# Patient Record
Sex: Female | Born: 1935 | Race: White | Hispanic: No | Smoking: Never smoker
Health system: Southern US, Community
[De-identification: ages and names within clinical notes are randomized; demographics above are authoritative.]

## PROBLEM LIST (undated history)

## (undated) ENCOUNTER — Inpatient Hospital Stay: Admission: EM | Payer: Self-pay | Source: Home / Self Care

## (undated) DIAGNOSIS — M810 Age-related osteoporosis without current pathological fracture: Secondary | ICD-10-CM

## (undated) DIAGNOSIS — H409 Unspecified glaucoma: Secondary | ICD-10-CM

## (undated) DIAGNOSIS — K589 Irritable bowel syndrome without diarrhea: Secondary | ICD-10-CM

## (undated) DIAGNOSIS — E7800 Pure hypercholesterolemia, unspecified: Secondary | ICD-10-CM

## (undated) DIAGNOSIS — I10 Essential (primary) hypertension: Secondary | ICD-10-CM

## (undated) DIAGNOSIS — R002 Palpitations: Secondary | ICD-10-CM

## (undated) HISTORY — PX: EYE SURGERY: SHX253

## (undated) HISTORY — PX: ABDOMINAL HYSTERECTOMY: SHX81

## (undated) HISTORY — PX: BREAST SURGERY: SHX581

## (undated) HISTORY — PX: CHOLECYSTECTOMY: SHX55

---

## 2004-02-17 ENCOUNTER — Ambulatory Visit (HOSPITAL_COMMUNITY): Admission: RE | Admit: 2004-02-17 | Discharge: 2004-02-17 | Payer: Self-pay | Admitting: Internal Medicine

## 2013-03-23 ENCOUNTER — Encounter (INDEPENDENT_AMBULATORY_CARE_PROVIDER_SITE_OTHER): Payer: Self-pay | Admitting: *Deleted

## 2014-04-05 ENCOUNTER — Other Ambulatory Visit (HOSPITAL_COMMUNITY): Payer: Self-pay | Admitting: Internal Medicine

## 2014-04-05 DIAGNOSIS — R1013 Epigastric pain: Secondary | ICD-10-CM

## 2014-04-07 ENCOUNTER — Ambulatory Visit (HOSPITAL_COMMUNITY): Payer: Medicare Other

## 2014-04-26 ENCOUNTER — Ambulatory Visit (HOSPITAL_COMMUNITY): Payer: Medicare Other

## 2014-04-28 ENCOUNTER — Encounter (INDEPENDENT_AMBULATORY_CARE_PROVIDER_SITE_OTHER): Payer: Self-pay | Admitting: *Deleted

## 2014-05-16 ENCOUNTER — Ambulatory Visit (HOSPITAL_COMMUNITY)
Admission: RE | Admit: 2014-05-16 | Discharge: 2014-05-16 | Disposition: A | Discharge status: Home / Self Care | Payer: Medicare Other | Source: Ambulatory Visit | Attending: Internal Medicine | Admitting: Internal Medicine

## 2014-05-16 DIAGNOSIS — K5730 Diverticulosis of large intestine without perforation or abscess without bleeding: Secondary | ICD-10-CM | POA: Diagnosis not present

## 2014-05-16 DIAGNOSIS — K838 Other specified diseases of biliary tract: Secondary | ICD-10-CM | POA: Diagnosis not present

## 2014-05-16 DIAGNOSIS — R109 Unspecified abdominal pain: Secondary | ICD-10-CM | POA: Diagnosis not present

## 2014-05-16 DIAGNOSIS — R160 Hepatomegaly, not elsewhere classified: Secondary | ICD-10-CM | POA: Diagnosis not present

## 2014-05-16 DIAGNOSIS — K7689 Other specified diseases of liver: Secondary | ICD-10-CM | POA: Insufficient documentation

## 2014-05-16 DIAGNOSIS — Z9889 Other specified postprocedural states: Secondary | ICD-10-CM | POA: Diagnosis not present

## 2014-05-16 DIAGNOSIS — K439 Ventral hernia without obstruction or gangrene: Secondary | ICD-10-CM | POA: Insufficient documentation

## 2014-05-16 DIAGNOSIS — R1013 Epigastric pain: Secondary | ICD-10-CM

## 2014-05-16 MED ORDER — IOHEXOL 300 MG/ML  SOLN
100.0000 mL | Freq: Once | INTRAMUSCULAR | Status: AC | PRN
  Administered 2014-05-16: 100 mL via INTRAVENOUS

## 2014-05-17 LAB — POCT I-STAT CREATININE: Creatinine, Ser: 1 mg/dL (ref 0.50–1.10)

## 2014-07-18 ENCOUNTER — Ambulatory Visit (INDEPENDENT_AMBULATORY_CARE_PROVIDER_SITE_OTHER): Payer: Medicare Other | Admitting: Internal Medicine

## 2014-07-20 ENCOUNTER — Encounter (INDEPENDENT_AMBULATORY_CARE_PROVIDER_SITE_OTHER): Payer: Self-pay | Admitting: *Deleted

## 2014-07-20 ENCOUNTER — Telehealth (INDEPENDENT_AMBULATORY_CARE_PROVIDER_SITE_OTHER): Payer: Self-pay | Admitting: *Deleted

## 2014-07-20 NOTE — Telephone Encounter (Signed)
Elwyn NO SHOWED for her apt with Dr. Karilyn Cotaehman on 07/18/14. A NS letter has been mailed.

## 2014-07-20 NOTE — Telephone Encounter (Signed)
Noted  

## 2015-08-24 ENCOUNTER — Encounter (INDEPENDENT_AMBULATORY_CARE_PROVIDER_SITE_OTHER): Payer: Self-pay | Admitting: *Deleted

## 2015-10-02 DIAGNOSIS — H538 Other visual disturbances: Secondary | ICD-10-CM | POA: Diagnosis not present

## 2015-10-02 DIAGNOSIS — Z970 Presence of artificial eye: Secondary | ICD-10-CM | POA: Diagnosis not present

## 2015-10-02 DIAGNOSIS — J449 Chronic obstructive pulmonary disease, unspecified: Secondary | ICD-10-CM | POA: Diagnosis not present

## 2015-10-02 DIAGNOSIS — E119 Type 2 diabetes mellitus without complications: Secondary | ICD-10-CM | POA: Diagnosis not present

## 2015-10-02 DIAGNOSIS — Z79899 Other long term (current) drug therapy: Secondary | ICD-10-CM | POA: Diagnosis not present

## 2015-10-02 DIAGNOSIS — Z7951 Long term (current) use of inhaled steroids: Secondary | ICD-10-CM | POA: Diagnosis not present

## 2015-10-02 DIAGNOSIS — Z880 Allergy status to penicillin: Secondary | ICD-10-CM | POA: Diagnosis not present

## 2015-10-02 DIAGNOSIS — H269 Unspecified cataract: Secondary | ICD-10-CM | POA: Diagnosis not present

## 2015-10-02 DIAGNOSIS — H2511 Age-related nuclear cataract, right eye: Secondary | ICD-10-CM | POA: Diagnosis not present

## 2015-10-02 DIAGNOSIS — H4010X0 Unspecified open-angle glaucoma, stage unspecified: Secondary | ICD-10-CM | POA: Diagnosis not present

## 2015-10-02 DIAGNOSIS — F329 Major depressive disorder, single episode, unspecified: Secondary | ICD-10-CM | POA: Diagnosis not present

## 2015-10-02 DIAGNOSIS — I10 Essential (primary) hypertension: Secondary | ICD-10-CM | POA: Diagnosis not present

## 2015-10-02 DIAGNOSIS — K219 Gastro-esophageal reflux disease without esophagitis: Secondary | ICD-10-CM | POA: Diagnosis not present

## 2015-10-02 DIAGNOSIS — M810 Age-related osteoporosis without current pathological fracture: Secondary | ICD-10-CM | POA: Diagnosis not present

## 2015-10-02 DIAGNOSIS — K589 Irritable bowel syndrome without diarrhea: Secondary | ICD-10-CM | POA: Diagnosis not present

## 2015-10-02 DIAGNOSIS — M1990 Unspecified osteoarthritis, unspecified site: Secondary | ICD-10-CM | POA: Diagnosis not present

## 2015-10-02 DIAGNOSIS — F419 Anxiety disorder, unspecified: Secondary | ICD-10-CM | POA: Diagnosis not present

## 2015-10-02 DIAGNOSIS — H1850 Unspecified hereditary corneal dystrophies: Secondary | ICD-10-CM | POA: Diagnosis not present

## 2015-10-10 ENCOUNTER — Ambulatory Visit (INDEPENDENT_AMBULATORY_CARE_PROVIDER_SITE_OTHER): Payer: Medicare Other | Admitting: Internal Medicine

## 2015-10-10 ENCOUNTER — Encounter (INDEPENDENT_AMBULATORY_CARE_PROVIDER_SITE_OTHER): Payer: Self-pay | Admitting: Internal Medicine

## 2015-10-19 DIAGNOSIS — G309 Alzheimer's disease, unspecified: Secondary | ICD-10-CM | POA: Diagnosis not present

## 2015-10-19 DIAGNOSIS — Z87891 Personal history of nicotine dependence: Secondary | ICD-10-CM | POA: Diagnosis not present

## 2015-10-19 DIAGNOSIS — K589 Irritable bowel syndrome without diarrhea: Secondary | ICD-10-CM | POA: Diagnosis not present

## 2015-10-19 DIAGNOSIS — Z6829 Body mass index (BMI) 29.0-29.9, adult: Secondary | ICD-10-CM | POA: Diagnosis not present

## 2015-10-19 DIAGNOSIS — K219 Gastro-esophageal reflux disease without esophagitis: Secondary | ICD-10-CM | POA: Diagnosis not present

## 2015-11-04 ENCOUNTER — Emergency Department (HOSPITAL_COMMUNITY)
Admission: EM | Admit: 2015-11-04 | Discharge: 2015-11-04 | Disposition: A | Discharge status: Home / Self Care | Payer: Medicare Other | Attending: Emergency Medicine | Admitting: Emergency Medicine

## 2015-11-04 ENCOUNTER — Emergency Department (HOSPITAL_COMMUNITY): Payer: Medicare Other

## 2015-11-04 ENCOUNTER — Encounter (HOSPITAL_COMMUNITY): Payer: Self-pay

## 2015-11-04 DIAGNOSIS — Z880 Allergy status to penicillin: Secondary | ICD-10-CM | POA: Insufficient documentation

## 2015-11-04 DIAGNOSIS — Y998 Other external cause status: Secondary | ICD-10-CM | POA: Insufficient documentation

## 2015-11-04 DIAGNOSIS — I10 Essential (primary) hypertension: Secondary | ICD-10-CM | POA: Diagnosis not present

## 2015-11-04 DIAGNOSIS — W19XXXA Unspecified fall, initial encounter: Secondary | ICD-10-CM

## 2015-11-04 DIAGNOSIS — W01198A Fall on same level from slipping, tripping and stumbling with subsequent striking against other object, initial encounter: Secondary | ICD-10-CM | POA: Diagnosis not present

## 2015-11-04 DIAGNOSIS — Y9389 Activity, other specified: Secondary | ICD-10-CM | POA: Insufficient documentation

## 2015-11-04 DIAGNOSIS — Y9289 Other specified places as the place of occurrence of the external cause: Secondary | ICD-10-CM | POA: Diagnosis not present

## 2015-11-04 DIAGNOSIS — Z8739 Personal history of other diseases of the musculoskeletal system and connective tissue: Secondary | ICD-10-CM | POA: Insufficient documentation

## 2015-11-04 DIAGNOSIS — Z8639 Personal history of other endocrine, nutritional and metabolic disease: Secondary | ICD-10-CM | POA: Insufficient documentation

## 2015-11-04 DIAGNOSIS — Z8719 Personal history of other diseases of the digestive system: Secondary | ICD-10-CM | POA: Diagnosis not present

## 2015-11-04 DIAGNOSIS — S0990XA Unspecified injury of head, initial encounter: Secondary | ICD-10-CM | POA: Diagnosis not present

## 2015-11-04 DIAGNOSIS — Z8669 Personal history of other diseases of the nervous system and sense organs: Secondary | ICD-10-CM | POA: Insufficient documentation

## 2015-11-04 DIAGNOSIS — S0181XA Laceration without foreign body of other part of head, initial encounter: Secondary | ICD-10-CM | POA: Diagnosis not present

## 2015-11-04 HISTORY — DX: Pure hypercholesterolemia, unspecified: E78.00

## 2015-11-04 HISTORY — DX: Unspecified glaucoma: H40.9

## 2015-11-04 HISTORY — DX: Irritable bowel syndrome, unspecified: K58.9

## 2015-11-04 HISTORY — DX: Age-related osteoporosis without current pathological fracture: M81.0

## 2015-11-04 HISTORY — DX: Essential (primary) hypertension: I10

## 2015-11-04 HISTORY — DX: Palpitations: R00.2

## 2015-11-04 MED ORDER — LIDOCAINE-EPINEPHRINE (PF) 1 %-1:200000 IJ SOLN
5.0000 mL | Freq: Once | INTRAMUSCULAR | Status: AC
  Administered 2015-11-04: 5 mL via INTRADERMAL
  Filled 2015-11-04: qty 10

## 2015-11-04 MED ORDER — TETANUS-DIPHTH-ACELL PERTUSSIS 5-2.5-18.5 LF-MCG/0.5 IM SUSP
0.5000 mL | Freq: Once | INTRAMUSCULAR | Status: AC
  Administered 2015-11-04: 0.5 mL via INTRAMUSCULAR
  Filled 2015-11-04: qty 0.5

## 2015-11-04 NOTE — ED Notes (Signed)
I was walking in the yard without my walker, and my legs gave out and I fell. Laceration noted to left forehead. Hit her head on a lawn chair. Did not have a loss of consciousness.

## 2015-11-04 NOTE — ED Provider Notes (Signed)
CSN: [id number]     Arrival date & time 11/04/15  1907 History   First MD Initiated Contact with Patient 11/04/15 1939     Chief Complaint  Patient presents with  . Head Laceration     (Consider location/radiation/quality/duration/timing/severity/associated sxs/prior Treatment) HPI   82 year old female presenting after fall. Patient is handling with walker when she lost her balance and fell striking her head. No loss of consciousness. She has a small laceration to her left forehead. She has no other complaints aside from this. No blood thinners. No nausea or vomiting. No acute visual complaints. Family reports that she is at her baseline mental status.  Past Medical History  Diagnosis Date  . Osteoporosis   . Hypertension   . High cholesterol   . Irritable bowel syndrome   . Palpitations   . Glaucoma    Past Surgical History  Procedure Laterality Date  . Eye surgery    . Breast surgery    . Abdominal hysterectomy     No family history on file. Social History  Substance Use Topics  . Smoking status: Never Smoker   . Smokeless tobacco: None  . Alcohol Use: No   OB History    No data available     Review of Systems  All systems reviewed and negative, other than as noted in HPI.   Allergies  Penicillins  Home Medications   Prior to Admission medications   Not on File   BP 166/70 mmHg  Pulse 60  Temp(Src) 98.4 F (36.9 C) (Oral)  Resp 20  Ht  (1.6 m)  Wt 148 lb (67.132 kg)  BMI 26.22 kg/m2  SpO2 95% Physical Exam  Constitutional: She is oriented to person, place, and time. She appears well-developed and well-nourished. No distress.  HENT:  Head: Normocephalic.  Mouth/Throat: Oropharynx is clear and moist.  Approximately 1.5 cm laceration to left forehead. Minimal bleeding. No evidence of foreign body. Minimal surrounding soft tissue tenderness. No facial tenderness otherwise. No midline cervical tenderness.  Eyes: Conjunctivae and EOM are normal.  Pupils are equal, round, and reactive to light. Right eye exhibits no discharge. Left eye exhibits no discharge.  Neck: Neck supple.  Cardiovascular: Normal rate, regular rhythm and normal heart sounds.  Exam reveals no gallop and no friction rub.   No murmur heard. Pulmonary/Chest: Effort normal and breath sounds normal. No respiratory distress.  Abdominal: Soft. She exhibits no distension. There is no tenderness.  Musculoskeletal: She exhibits no edema or tenderness.  No midline spinal tenderness. No apparent bony tenderness of extremities. No apparent pain with range of motion of the large joints.  Neurological: She is alert and oriented to person, place, and time. No cranial nerve deficit. She exhibits normal muscle tone. Coordination normal.  Skin: Skin is warm and dry.  Psychiatric: She has a normal mood and affect. Her behavior is normal. Thought content normal.  Nursing note and vitals reviewed.   ED Course  Procedures (including critical care time)  LACERATION REPAIR Performed by: Raeford Razor Authorized by: Raeford Razor Consent: Verbal consent obtained. Risks and benefits: risks, benefits and alternatives were discussed Consent given by: patient Patient identity confirmed: provided demographic data Prepped and Draped in normal sterile fashion Wound explored  Laceration Location: L forehead  Laceration Length: 1.5 cm  No Foreign Bodies seen or palpated  Anesthesia: local infiltration  Local anesthetic: lidocaine 1% w epinephrine  Anesthetic total: 1.5 ml  Irrigation method: syringe Amount of cleaning: standard  Skin closure:  6-0 prolene  Number of sutures: 3  Technique: simple interupted  Patient tolerance: Patient tolerated the procedure well with no immediate complications. Labs Review Labs Reviewed - No data to display  Imaging Review No results found. I have personally reviewed and evaluated these images and lab results as part of my medical  decision-making.   EKG Interpretation None      MDM   Final diagnoses:  Forehead laceration, initial encounter  Fall, initial encounter  Closed head injury, initial encounter    82 year old female with small left forehead laceration after fall. Fall sounds mechanical in nature. Nonfocal neuro exam. Unremarkable imaging. Head injury instructions and continued wound care was discussed. Outpatient follow-up as needed and for suture removal in 5-7 days.    Raeford Razor, MD 11/11/15 1452

## 2015-11-04 NOTE — Discharge Instructions (Signed)
Facial Laceration ° A facial laceration is a cut on the face. These injuries can be painful and cause bleeding. Lacerations usually heal quickly, but they need special care to reduce scarring. °DIAGNOSIS  °Your health care provider will take a medical history, ask for details about how the injury occurred, and examine the wound to determine how deep the cut is. °TREATMENT  °Some facial lacerations may not require closure. Others may not be able to be closed because of an increased risk of infection. The risk of infection and the chance for successful closure will depend on various factors, including the amount of time since the injury occurred. °The wound may be cleaned to help prevent infection. If closure is appropriate, pain medicines may be given if needed. Your health care provider will use stitches (sutures), wound glue (adhesive), or skin adhesive strips to repair the laceration. These tools bring the skin edges together to allow for faster healing and a better cosmetic outcome. If needed, you may also be given a tetanus shot. °HOME CARE INSTRUCTIONS °· Only take over-the-counter or prescription medicines as directed by your health care provider. °· Follow your health care provider's instructions for wound care. These instructions will vary depending on the technique used for closing the wound. °For Sutures: °· Keep the wound clean and dry.   °· If you were given a bandage (dressing), you should change it at least once a day. Also change the dressing if it becomes wet or dirty, or as directed by your health care provider.   °· Wash the wound with soap and water 2 times a day. Rinse the wound off with water to remove all soap. Pat the wound dry with a clean towel.   °· After cleaning, apply a thin layer of the antibiotic ointment recommended by your health care provider. This will help prevent infection and keep the dressing from sticking.   °· You may shower as usual after the first 24 hours. Do not soak the  wound in water until the sutures are removed.   °· Get your sutures removed as directed by your health care provider. With facial lacerations, sutures should usually be taken out after 4-5 days to avoid stitch marks.   °· Wait a few days after your sutures are removed before applying any makeup. °For Skin Adhesive Strips: °· Keep the wound clean and dry.   °· Do not get the skin adhesive strips wet. You may bathe carefully, using caution to keep the wound dry.   °· If the wound gets wet, pat it dry with a clean towel.   °· Skin adhesive strips will fall off on their own. You may trim the strips as the wound heals. Do not remove skin adhesive strips that are still stuck to the wound. They will fall off in time.   °For Wound Adhesive: °· You may briefly wet your wound in the shower or bath. Do not soak or scrub the wound. Do not swim. Avoid periods of heavy sweating until the skin adhesive has fallen off on its own. After showering or bathing, gently pat the wound dry with a clean towel.   °· Do not apply liquid medicine, cream medicine, ointment medicine, or makeup to your wound while the skin adhesive is in place. This may loosen the film before your wound is healed.   °· If a dressing is placed over the wound, be careful not to apply tape directly over the skin adhesive. This may cause the adhesive to be pulled off before the wound is healed.   °· Avoid   prolonged exposure to sunlight or tanning lamps while the skin adhesive is in place. °· The skin adhesive will usually remain in place for 5-10 days, then naturally fall off the skin. Do not pick at the adhesive film.   °After Healing: °Once the wound has healed, cover the wound with sunscreen during the day for 1 full year. This can help minimize scarring. Exposure to ultraviolet light in the first year will darken the scar. It can take 1-2 years for the scar to lose its redness and to heal completely.  °SEEK MEDICAL CARE IF: °· You have a fever. °SEEK IMMEDIATE  MEDICAL CARE IF: °· You have redness, pain, or swelling around the wound.   °· You see a yellowish-white fluid (pus) coming from the wound.   °  °This information is not intended to replace advice given to you by your health care provider. Make sure you discuss any questions you have with your health care provider. °  °Document Released: 10/17/2004 Document Revised: 09/30/2014 Document Reviewed: 04/22/2013 °Elsevier Interactive Patient Education ©2016 [employer] ° °Head Injury, Adult °You have a head injury. Headaches and throwing up (vomiting) are common after a head injury. It should be easy to wake up from sleeping. Sometimes you must stay in the hospital. Most problems happen within the first 24 hours. Side effects may occur up to 7-10 days after the injury.  °WHAT ARE THE TYPES OF HEAD INJURIES? °Head injuries can be as minor as a bump. Some head injuries can be more severe. More severe head injuries include: °· A jarring injury to the brain (concussion). °· A bruise of the brain (contusion). This mean there is bleeding in the brain that can cause swelling. °· A cracked skull (skull fracture). °· Bleeding in the brain that collects, clots, and forms a bump (hematoma). °WHEN SHOULD I GET HELP RIGHT AWAY?  °· You are confused or sleepy. °· You cannot be woken up. °· You feel sick to your stomach (nauseous) or keep throwing up (vomiting). °· Your dizziness or unsteadiness is getting worse. °· You have very bad, lasting headaches that are not helped by medicine. Take medicines only as told by your doctor. °· You cannot use your arms or legs like normal. °· You cannot walk. °· You notice changes in the black spots in the center of the colored part of your eye (pupil). °· You have clear or bloody fluid coming from your nose or ears. °· You have trouble seeing. °During the next 24 hours after the injury, you must stay with someone who can watch you. This person should get help right away (call 911 in the [place] if  you start to shake and are not able to control it (have seizures), you pass out, or you are unable to wake up. °HOW CAN I PREVENT A HEAD INJURY IN THE FUTURE? °· Wear seat belts. °· Wear a helmet while bike riding and playing sports like football. °· Stay away from dangerous activities around the house. °WHEN CAN I RETURN TO NORMAL ACTIVITIES AND ATHLETICS? °See your doctor before doing these activities. You should not do normal activities or play contact sports until 1 week after the following symptoms have stopped: °· Headache that does not go away. °· Dizziness. °· Poor attention. °· Confusion. °· Memory problems. °· Sickness to your stomach or throwing up. °· Tiredness. °· Fussiness. °· Bothered by bright lights or loud noises. °· Anxiousness or depression. °· Restless sleep. °MAKE SURE YOU:  °· Understand these instructions. °· Will   watch your condition. °· Will get help right away if you are not doing well or get worse. °  °This information is not intended to replace advice given to you by your health care provider. Make sure you discuss any questions you have with your health care provider. °  °Document Released: 08/22/2008 Document Revised: 09/30/2014 Document Reviewed: 05/17/2013 °Elsevier Interactive Patient Education ©2016 [employer] ° °

## 2015-11-10 ENCOUNTER — Encounter (HOSPITAL_COMMUNITY): Payer: Self-pay | Admitting: Emergency Medicine

## 2015-11-10 ENCOUNTER — Emergency Department (HOSPITAL_COMMUNITY)
Admission: EM | Admit: 2015-11-10 | Discharge: 2015-11-10 | Disposition: A | Discharge status: Home / Self Care | Payer: Medicare Other | Attending: Emergency Medicine | Admitting: Emergency Medicine

## 2015-11-10 DIAGNOSIS — K589 Irritable bowel syndrome without diarrhea: Secondary | ICD-10-CM | POA: Insufficient documentation

## 2015-11-10 DIAGNOSIS — M810 Age-related osteoporosis without current pathological fracture: Secondary | ICD-10-CM | POA: Diagnosis not present

## 2015-11-10 DIAGNOSIS — H409 Unspecified glaucoma: Secondary | ICD-10-CM | POA: Diagnosis not present

## 2015-11-10 DIAGNOSIS — Z880 Allergy status to penicillin: Secondary | ICD-10-CM | POA: Diagnosis not present

## 2015-11-10 DIAGNOSIS — I10 Essential (primary) hypertension: Secondary | ICD-10-CM | POA: Diagnosis not present

## 2015-11-10 DIAGNOSIS — Z79899 Other long term (current) drug therapy: Secondary | ICD-10-CM | POA: Insufficient documentation

## 2015-11-10 DIAGNOSIS — Z7951 Long term (current) use of inhaled steroids: Secondary | ICD-10-CM | POA: Insufficient documentation

## 2015-11-10 DIAGNOSIS — E7800 Pure hypercholesterolemia, unspecified: Secondary | ICD-10-CM | POA: Diagnosis not present

## 2015-11-10 DIAGNOSIS — Z4802 Encounter for removal of sutures: Secondary | ICD-10-CM | POA: Diagnosis not present

## 2015-11-10 NOTE — ED Provider Notes (Signed)
CSN: [id number]     Arrival date & time 11/10/15  1700 History   First MD Initiated Contact with Patient 11/10/15 1729     Chief Complaint  Patient presents with  . Suture / Staple Removal     (Consider location/radiation/quality/duration/timing/severity/associated sxs/prior Treatment) HPI.... Status post left forehead laceration approximately 6 days ago. Here for suture removal. She reports no problems with wound.  Past Medical History  Diagnosis Date  . Osteoporosis   . Hypertension   . High cholesterol   . Irritable bowel syndrome   . Palpitations   . Glaucoma    Past Surgical History  Procedure Laterality Date  . Eye surgery    . Breast surgery    . Abdominal hysterectomy     History reviewed. No pertinent family history. Social History  Substance Use Topics  . Smoking status: Never Smoker   . Smokeless tobacco: None  . Alcohol Use: No   OB History    No data available     Review of Systems  All other systems reviewed and are negative.     Allergies  Penicillins  Home Medications   Prior to Admission medications   Medication Sig Start Date End Date Taking? Authorizing Provider  alendronate (FOSAMAX) 70 MG tablet Take 70 mg by mouth once a week. Take with a full glass of water on an empty stomach.Takes on Monday    Historical Provider, MD  atorvastatin (LIPITOR) 20 MG tablet Take 20 mg by mouth daily.    Historical Provider, MD  calcium-vitamin D (OSCAL WITH D) 500-200 MG-UNIT tablet Take 1 tablet by mouth daily with breakfast.    Historical Provider, MD  clonazePAM (KLONOPIN) 1 MG tablet Take 1 mg by mouth 3 (three) times daily.     Historical Provider, MD  dexlansoprazole (DEXILANT) 60 MG capsule Take 60 mg by mouth daily.    Historical Provider, MD  diphenoxylate-atropine (LOMOTIL) 2.5-0.025 MG tablet Take 1 tablet by mouth 4 (four) times daily as needed for diarrhea or loose stools.    Historical Provider, MD  fexofenadine (ALLEGRA) 180 MG tablet Take  180 mg by mouth daily.    Historical Provider, MD  Fluticasone-Salmeterol (ADVAIR) 250-50 MCG/DOSE AEPB Inhale 1 puff into the lungs daily.    Historical Provider, MD  gabapentin (NEURONTIN) 100 MG capsule Take 100 mg by mouth 2 (two) times daily.    Historical Provider, MD  hydrochlorothiazide (HYDRODIURIL) 25 MG tablet Take 25 mg by mouth daily.    Historical Provider, MD  HYDROcodone-acetaminophen (NORCO/VICODIN) 5-325 MG tablet Take 1 tablet by mouth 2 (two) times daily as needed for moderate pain.    Historical Provider, MD  latanoprost (XALATAN) 0.005 % ophthalmic solution Place 1 drop into both eyes at bedtime.    Historical Provider, MD  promethazine (PHENERGAN) 25 MG tablet Take 25 mg by mouth every 6 (six) hours as needed for nausea or vomiting.    Historical Provider, MD  propranolol (INDERAL) 60 MG tablet Take 60 mg by mouth 2 (two) times daily.    Historical Provider, MD  timolol (BETIMOL) 0.5 % ophthalmic solution Place 1 drop into the right eye daily.    Historical Provider, MD  venlafaxine XR (EFFEXOR-XR) 150 MG 24 hr capsule Take 150 mg by mouth daily with breakfast.    Historical Provider, MD   BP 172/59 mmHg  Pulse 62  Temp(Src) 97.6 F (36.4 C) (Temporal)  Resp 14  Ht  (1.6 m)  Wt 145 lb (65.772 kg)  BMI 25.69 kg/m2  SpO2 95% Physical Exam  Constitutional: She is oriented to person, place, and time. She appears well-developed and well-nourished.  HENT:  Healing 2 cm oblique laceration on left forehead  Musculoskeletal: Normal range of motion.  Neurological: She is alert and oriented to person, place, and time.  Skin: Skin is warm and dry.  Psychiatric: She has a normal mood and affect.    ED Course  Procedures (including critical care time) Labs Review Labs Reviewed - No data to display  Imaging Review No results found. I have personally reviewed and evaluated these images and lab results as part of my medical decision-making.   EKG  Interpretation None      MDM   Final diagnoses:  Visit for suture removal    Normal wound healing. Suture removal.    Donnetta Hutching, MD 11/10/15 1739

## 2015-11-10 NOTE — Discharge Instructions (Signed)
Keep wound clean and dry.

## 2015-11-10 NOTE — ED Notes (Signed)
Pt states she is here for her sutures to be removed from a week ago.  Denies any complaints.

## 2015-11-20 DIAGNOSIS — G309 Alzheimer's disease, unspecified: Secondary | ICD-10-CM | POA: Diagnosis not present

## 2015-11-20 DIAGNOSIS — K589 Irritable bowel syndrome without diarrhea: Secondary | ICD-10-CM | POA: Diagnosis not present

## 2015-11-20 DIAGNOSIS — I10 Essential (primary) hypertension: Secondary | ICD-10-CM | POA: Diagnosis not present

## 2015-12-26 DIAGNOSIS — K219 Gastro-esophageal reflux disease without esophagitis: Secondary | ICD-10-CM | POA: Diagnosis not present

## 2015-12-26 DIAGNOSIS — E7800 Pure hypercholesterolemia, unspecified: Secondary | ICD-10-CM | POA: Diagnosis not present

## 2015-12-26 DIAGNOSIS — N390 Urinary tract infection, site not specified: Secondary | ICD-10-CM | POA: Diagnosis not present

## 2015-12-26 DIAGNOSIS — G309 Alzheimer's disease, unspecified: Secondary | ICD-10-CM | POA: Diagnosis not present

## 2015-12-28 DIAGNOSIS — R350 Frequency of micturition: Secondary | ICD-10-CM | POA: Diagnosis not present

## 2015-12-28 DIAGNOSIS — Z0000 Encounter for general adult medical examination without abnormal findings: Secondary | ICD-10-CM | POA: Diagnosis not present

## 2015-12-28 DIAGNOSIS — N3944 Nocturnal enuresis: Secondary | ICD-10-CM | POA: Diagnosis not present

## 2015-12-28 DIAGNOSIS — N3946 Mixed incontinence: Secondary | ICD-10-CM | POA: Diagnosis not present

## 2015-12-28 DIAGNOSIS — N3942 Incontinence without sensory awareness: Secondary | ICD-10-CM | POA: Diagnosis not present

## 2016-01-17 ENCOUNTER — Emergency Department (HOSPITAL_COMMUNITY)
Admission: EM | Admit: 2016-01-17 | Discharge: 2016-01-17 | Disposition: A | Discharge status: Home / Self Care | Payer: Medicare Other | Attending: Emergency Medicine | Admitting: Emergency Medicine

## 2016-01-17 ENCOUNTER — Emergency Department (HOSPITAL_COMMUNITY): Payer: Medicare Other

## 2016-01-17 DIAGNOSIS — S82002A Unspecified fracture of left patella, initial encounter for closed fracture: Secondary | ICD-10-CM

## 2016-01-17 DIAGNOSIS — Y999 Unspecified external cause status: Secondary | ICD-10-CM | POA: Insufficient documentation

## 2016-01-17 DIAGNOSIS — W010XXA Fall on same level from slipping, tripping and stumbling without subsequent striking against object, initial encounter: Secondary | ICD-10-CM | POA: Insufficient documentation

## 2016-01-17 DIAGNOSIS — I10 Essential (primary) hypertension: Secondary | ICD-10-CM | POA: Insufficient documentation

## 2016-01-17 DIAGNOSIS — Y939 Activity, unspecified: Secondary | ICD-10-CM | POA: Insufficient documentation

## 2016-01-17 DIAGNOSIS — Y92010 Kitchen of single-family (private) house as the place of occurrence of the external cause: Secondary | ICD-10-CM | POA: Insufficient documentation

## 2016-01-17 DIAGNOSIS — Z79899 Other long term (current) drug therapy: Secondary | ICD-10-CM | POA: Insufficient documentation

## 2016-01-17 DIAGNOSIS — S82092A Other fracture of left patella, initial encounter for closed fracture: Secondary | ICD-10-CM | POA: Diagnosis not present

## 2016-01-17 DIAGNOSIS — S82001A Unspecified fracture of right patella, initial encounter for closed fracture: Secondary | ICD-10-CM | POA: Insufficient documentation

## 2016-01-17 DIAGNOSIS — M810 Age-related osteoporosis without current pathological fracture: Secondary | ICD-10-CM | POA: Insufficient documentation

## 2016-01-17 MED ORDER — KETOROLAC TROMETHAMINE 30 MG/ML IJ SOLN
7.5000 mg | Freq: Once | INTRAMUSCULAR | Status: AC
  Administered 2016-01-17: 7.5 mg via INTRAVENOUS
  Filled 2016-01-17: qty 1

## 2016-01-17 MED ORDER — OXYCODONE-ACETAMINOPHEN 5-325 MG PO TABS: 1.0000 | ORAL_TABLET | ORAL | Status: DC | PRN

## 2016-01-17 MED ORDER — HYDROMORPHONE HCL 1 MG/ML IJ SOLN
0.5000 mg | Freq: Once | INTRAMUSCULAR | Status: AC
  Administered 2016-01-17: 0.5 mg via INTRAVENOUS
  Filled 2016-01-17: qty 1

## 2016-01-17 MED ORDER — DOCUSATE SODIUM 100 MG PO CAPS: 100.0000 mg | ORAL_CAPSULE | Freq: Two times a day (BID) | ORAL | Status: DC | PRN

## 2016-01-17 NOTE — Discharge Instructions (Signed)
Patellar Fracture, Adult °A patellar fracture is a break in your kneecap (patella).  °CAUSES  °· A direct blow to the knee or a fall is usually the cause of a broken patella. °· A very hard and strong bending of your knee can cause a patellar fracture. °RISK FACTORS °Involvement in contact sports, especially sports that involve a lot of jumping. °SIGNS AND SYMPTOMS  °· Tender and swollen knee. °· Pain when you move your knee, especially when you try to straighten out your leg. °· Difficulty walking or putting weight on your knee. °· Misshapen knee (as if a bone is out of place). °DIAGNOSIS  °Patellar fracture is usually diagnosed with a physical exam and an X-ray exam. °TREATMENT  °Treatment depends on the type of fracture: °· If your patella is still in the right position after the fracture and you can still straighten your leg out, you can usually be treated with a splint or cast for 4-6 weeks. °· If your patella is broken into multiple small pieces but you are able to straighten your leg, you can usually be treated with a splint or cast for 4-6 weeks. Sometimes your patella may need to be removed before the cast is applied. °· If you cannot straighten out your leg after a patellar fracture, then surgery is required to hold the bony fragments together until they heal. A cast or splint will be applied for 4-6 weeks. °HOME CARE INSTRUCTIONS  °· Only take over-the-counter or prescription medicines for pain, discomfort, or fever as directed by your health care provider. °· Use crutches as directed, and exercise the leg as directed. °· Apply ice to the injured area: °¨ Put ice in a plastic bag. °¨ Place a towel between your skin and the bag. °¨ Leave the ice on for 20 minutes, 2-3 times a day. °· Elevate the affected knee above the level of your heart. °SEEK MEDICAL CARE IF: °· You suspect you have significantly injured your knee. °· You hear a pop after a knee injury. °· Your knee is misshapen after a knee  injury. °· You have pain when you move your knee. °· You have difficulty walking or putting weight on your knee. °· You cannot fully move your knee. °SEEK IMMEDIATE MEDICAL CARE IF: °· You have redness, swelling, or increasing pain in your knee. °· You have a fever. °  °This information is not intended to replace advice given to you by your health care provider. Make sure you discuss any questions you have with your health care provider. °  °Document Released: 06/08/2003 Document Revised: 06/30/2013 Document Reviewed: 04/21/2013 °Elsevier Interactive Patient Education ©2016 [employer] ° °

## 2016-01-17 NOTE — ED Notes (Signed)
Pt. Slipped and fell in kitchen. C/o left pain. Reports hitting head, denies LOC.

## 2016-01-18 ENCOUNTER — Inpatient Hospital Stay (HOSPITAL_COMMUNITY): Payer: Medicare Other

## 2016-01-18 ENCOUNTER — Ambulatory Visit: Payer: Medicare Other | Admitting: Orthopedic Surgery

## 2016-01-18 ENCOUNTER — Ambulatory Visit (INDEPENDENT_AMBULATORY_CARE_PROVIDER_SITE_OTHER): Payer: Medicare Other | Admitting: Orthopedic Surgery

## 2016-01-18 ENCOUNTER — Encounter (HOSPITAL_COMMUNITY): Payer: Self-pay | Admitting: *Deleted

## 2016-01-18 ENCOUNTER — Inpatient Hospital Stay (HOSPITAL_COMMUNITY)
Admission: AD | Admit: 2016-01-18 | Discharge: 2016-01-22 | DRG: 517 | Disposition: A | Discharge status: Skilled Nursing Facility | Payer: Medicare Other | Source: Ambulatory Visit | Attending: Orthopedic Surgery | Admitting: Orthopedic Surgery

## 2016-01-18 VITALS — BP 119/62 | HR 67 | Ht 63.0 in | Wt 147.0 lb

## 2016-01-18 DIAGNOSIS — M1990 Unspecified osteoarthritis, unspecified site: Secondary | ICD-10-CM | POA: Diagnosis present

## 2016-01-18 DIAGNOSIS — Z8249 Family history of ischemic heart disease and other diseases of the circulatory system: Secondary | ICD-10-CM | POA: Diagnosis not present

## 2016-01-18 DIAGNOSIS — I517 Cardiomegaly: Secondary | ICD-10-CM | POA: Diagnosis not present

## 2016-01-18 DIAGNOSIS — I10 Essential (primary) hypertension: Secondary | ICD-10-CM | POA: Diagnosis present

## 2016-01-18 DIAGNOSIS — E7800 Pure hypercholesterolemia, unspecified: Secondary | ICD-10-CM | POA: Diagnosis not present

## 2016-01-18 DIAGNOSIS — S82032A Displaced transverse fracture of left patella, initial encounter for closed fracture: Principal | ICD-10-CM | POA: Diagnosis present

## 2016-01-18 DIAGNOSIS — Z01818 Encounter for other preprocedural examination: Secondary | ICD-10-CM

## 2016-01-18 DIAGNOSIS — Y92019 Unspecified place in single-family (private) house as the place of occurrence of the external cause: Secondary | ICD-10-CM | POA: Diagnosis not present

## 2016-01-18 DIAGNOSIS — W19XXXA Unspecified fall, initial encounter: Secondary | ICD-10-CM | POA: Diagnosis present

## 2016-01-18 DIAGNOSIS — M25562 Pain in left knee: Secondary | ICD-10-CM | POA: Diagnosis not present

## 2016-01-18 DIAGNOSIS — S82002A Unspecified fracture of left patella, initial encounter for closed fracture: Secondary | ICD-10-CM

## 2016-01-18 DIAGNOSIS — H409 Unspecified glaucoma: Secondary | ICD-10-CM | POA: Diagnosis present

## 2016-01-18 DIAGNOSIS — M810 Age-related osteoporosis without current pathological fracture: Secondary | ICD-10-CM | POA: Diagnosis present

## 2016-01-18 DIAGNOSIS — Z823 Family history of stroke: Secondary | ICD-10-CM

## 2016-01-18 LAB — CBC WITH DIFFERENTIAL/PLATELET
BASOS PCT: 0 %
Basophils Absolute: 0 10*3/uL (ref 0.0–0.1)
EOS ABS: 0.2 10*3/uL (ref 0.0–0.7)
EOS PCT: 3 %
HCT: 32.3 % — ABNORMAL LOW (ref 36.0–46.0)
Hemoglobin: 10.6 g/dL — ABNORMAL LOW (ref 12.0–15.0)
LYMPHS ABS: 2.5 10*3/uL (ref 0.7–4.0)
Lymphocytes Relative: 33 %
MCH: 30.1 pg (ref 26.0–34.0)
MCHC: 32.8 g/dL (ref 30.0–36.0)
MCV: 91.8 fL (ref 78.0–100.0)
MONO ABS: 0.8 10*3/uL (ref 0.1–1.0)
MONOS PCT: 10 %
Neutro Abs: 4.1 10*3/uL (ref 1.7–7.7)
Neutrophils Relative %: 54 %
PLATELETS: 234 10*3/uL (ref 150–400)
RBC: 3.52 MIL/uL — ABNORMAL LOW (ref 3.87–5.11)
RDW: 12.9 % (ref 11.5–15.5)
WBC: 7.5 10*3/uL (ref 4.0–10.5)

## 2016-01-18 LAB — COMPREHENSIVE METABOLIC PANEL
ALK PHOS: 75 U/L (ref 38–126)
ALT: 12 U/L — AB (ref 14–54)
AST: 21 U/L (ref 15–41)
Albumin: 3.6 g/dL (ref 3.5–5.0)
Anion gap: 6 (ref 5–15)
BUN: 18 mg/dL (ref 6–20)
CALCIUM: 9.8 mg/dL (ref 8.9–10.3)
CHLORIDE: 104 mmol/L (ref 101–111)
CO2: 29 mmol/L (ref 22–32)
CREATININE: 0.97 mg/dL (ref 0.44–1.00)
GFR calc Af Amer: >60 mL/min (ref >60)
GFR calc non Af Amer: 54 mL/min — ABNORMAL LOW (ref >60)
Glucose, Bld: 103 mg/dL — ABNORMAL HIGH (ref 65–99)
Potassium: 3.4 mmol/L — ABNORMAL LOW (ref 3.5–5.1)
SODIUM: 139 mmol/L (ref 135–145)
Total Bilirubin: 0.9 mg/dL (ref 0.3–1.2)
Total Protein: 6.5 g/dL (ref 6.5–8.1)

## 2016-01-18 LAB — SURGICAL PCR SCREEN
MRSA, PCR: NEGATIVE
STAPHYLOCOCCUS AUREUS: NEGATIVE

## 2016-01-18 LAB — PROTIME-INR
INR: 1.03 (ref 0.00–1.49)
PROTHROMBIN TIME: 13.7 s (ref 11.6–15.2)

## 2016-01-18 MED ORDER — SODIUM CHLORIDE 0.9 % IV SOLN
INTRAVENOUS | Status: DC
  Administered 2016-01-18 – 2016-01-21 (×4): via INTRAVENOUS

## 2016-01-18 MED ORDER — CLONAZEPAM 0.5 MG PO TABS
1.0000 mg | ORAL_TABLET | Freq: Three times a day (TID) | ORAL | Status: DC
  Administered 2016-01-18 – 2016-01-22 (×11): 1 mg via ORAL
  Filled 2016-01-18 (×11): qty 2

## 2016-01-18 MED ORDER — SENNA 8.6 MG PO TABS
1.0000 | ORAL_TABLET | Freq: Two times a day (BID) | ORAL | Status: DC
  Administered 2016-01-18 – 2016-01-22 (×8): 8.6 mg via ORAL
  Filled 2016-01-18 (×9): qty 1

## 2016-01-18 MED ORDER — DOCUSATE SODIUM 100 MG PO CAPS
100.0000 mg | ORAL_CAPSULE | Freq: Two times a day (BID) | ORAL | Status: DC | PRN
Start: 2016-01-18 — End: 2016-01-22

## 2016-01-18 MED ORDER — CHLORHEXIDINE GLUCONATE 4 % EX LIQD
60.0000 mL | Freq: Once | CUTANEOUS | Status: AC
  Administered 2016-01-19: 4 via TOPICAL
  Filled 2016-01-18: qty 15

## 2016-01-18 MED ORDER — MOMETASONE FURO-FORMOTEROL FUM 200-5 MCG/ACT IN AERO
2.0000 | INHALATION_SPRAY | Freq: Two times a day (BID) | RESPIRATORY_TRACT | Status: DC
Start: 2016-01-18 — End: 2016-01-22
  Administered 2016-01-18 – 2016-01-22 (×7): 2 via RESPIRATORY_TRACT
  Filled 2016-01-18: qty 8.8

## 2016-01-18 MED ORDER — ATORVASTATIN CALCIUM 20 MG PO TABS
20.0000 mg | ORAL_TABLET | Freq: Every day | ORAL | Status: DC
  Administered 2016-01-20 – 2016-01-22 (×2): 20 mg via ORAL
  Filled 2016-01-18 (×3): qty 1

## 2016-01-18 MED ORDER — POVIDONE-IODINE 10 % EX SWAB: 2.0000 "application " | Freq: Once | CUTANEOUS | Status: DC

## 2016-01-18 MED ORDER — HYDROCODONE-ACETAMINOPHEN 5-325 MG PO TABS: 1.0000 | ORAL_TABLET | Freq: Two times a day (BID) | ORAL | Status: DC | PRN

## 2016-01-18 MED ORDER — HYDROCHLOROTHIAZIDE 25 MG PO TABS
25.0000 mg | ORAL_TABLET | Freq: Every day | ORAL | Status: DC
  Administered 2016-01-19 – 2016-01-22 (×4): 25 mg via ORAL
  Filled 2016-01-18 (×4): qty 1

## 2016-01-18 MED ORDER — CLINDAMYCIN PHOSPHATE 900 MG/50ML IV SOLN
900.0000 mg | INTRAVENOUS | Status: AC
  Administered 2016-01-19: 900 mg via INTRAVENOUS
  Filled 2016-01-18 (×2): qty 50

## 2016-01-18 MED ORDER — VENLAFAXINE HCL ER 75 MG PO CP24
150.0000 mg | ORAL_CAPSULE | Freq: Every day | ORAL | Status: DC
  Administered 2016-01-19 – 2016-01-22 (×4): 150 mg via ORAL
  Filled 2016-01-18 (×4): qty 2

## 2016-01-18 MED ORDER — LATANOPROST 0.005 % OP SOLN
OPHTHALMIC | Status: AC
  Filled 2016-01-18: qty 2.5

## 2016-01-18 MED ORDER — DIPHENOXYLATE-ATROPINE 2.5-0.025 MG PO TABS: 1.0000 | ORAL_TABLET | Freq: Four times a day (QID) | ORAL | Status: DC | PRN

## 2016-01-18 MED ORDER — PANTOPRAZOLE SODIUM 40 MG PO TBEC
80.0000 mg | DELAYED_RELEASE_TABLET | Freq: Every day | ORAL | Status: DC
  Administered 2016-01-19 – 2016-01-22 (×4): 80 mg via ORAL
  Filled 2016-01-18 (×4): qty 2

## 2016-01-18 MED ORDER — ALENDRONATE SODIUM 70 MG PO TABS: 70.0000 mg | ORAL_TABLET | ORAL | Status: DC

## 2016-01-18 MED ORDER — LATANOPROST 0.005 % OP SOLN
1.0000 [drp] | Freq: Every day | OPHTHALMIC | Status: DC
Start: 2016-01-18 — End: 2016-01-22
  Administered 2016-01-18 – 2016-01-21 (×3): 1 [drp] via OPHTHALMIC
  Filled 2016-01-18: qty 2.5

## 2016-01-18 MED ORDER — HEPARIN SODIUM (PORCINE) 5000 UNIT/ML IJ SOLN
5000.0000 [IU] | Freq: Three times a day (TID) | INTRAMUSCULAR | Status: AC
  Administered 2016-01-18: 5000 [IU] via SUBCUTANEOUS
  Filled 2016-01-18 (×2): qty 1

## 2016-01-18 MED ORDER — MOMETASONE FURO-FORMOTEROL FUM 200-5 MCG/ACT IN AERO
INHALATION_SPRAY | RESPIRATORY_TRACT | Status: AC
  Filled 2016-01-18: qty 8.8

## 2016-01-18 MED ORDER — PROMETHAZINE HCL 12.5 MG PO TABS
25.0000 mg | ORAL_TABLET | Freq: Four times a day (QID) | ORAL | Status: DC | PRN
  Administered 2016-01-20: 25 mg via ORAL
  Filled 2016-01-18: qty 2

## 2016-01-18 MED ORDER — PROPRANOLOL HCL 20 MG PO TABS
60.0000 mg | ORAL_TABLET | Freq: Two times a day (BID) | ORAL | Status: DC
  Administered 2016-01-18 – 2016-01-22 (×8): 60 mg via ORAL
  Filled 2016-01-18 (×7): qty 3

## 2016-01-18 MED ORDER — POLYETHYLENE GLYCOL 3350 17 G PO PACK: 17.0000 g | PACK | Freq: Every day | ORAL | Status: DC | PRN

## 2016-01-18 MED ORDER — HYDROCODONE-ACETAMINOPHEN 5-325 MG PO TABS
1.0000 | ORAL_TABLET | ORAL | Status: DC | PRN
  Administered 2016-01-18: 1 via ORAL
  Administered 2016-01-19 – 2016-01-20 (×3): 2 via ORAL
  Filled 2016-01-18 (×3): qty 2
  Filled 2016-01-18: qty 1

## 2016-01-18 MED ORDER — MECLIZINE HCL 12.5 MG PO TABS
25.0000 mg | ORAL_TABLET | Freq: Three times a day (TID) | ORAL | Status: DC | PRN
Start: 2016-01-18 — End: 2016-01-22

## 2016-01-18 MED ORDER — GABAPENTIN 100 MG PO CAPS
100.0000 mg | ORAL_CAPSULE | Freq: Two times a day (BID) | ORAL | Status: DC
  Administered 2016-01-18 – 2016-01-22 (×8): 100 mg via ORAL
  Filled 2016-01-18 (×8): qty 1

## 2016-01-18 MED ORDER — LORATADINE 10 MG PO TABS
10.0000 mg | ORAL_TABLET | Freq: Every day | ORAL | Status: DC
  Administered 2016-01-19 – 2016-01-22 (×4): 10 mg via ORAL
  Filled 2016-01-18 (×4): qty 1

## 2016-01-18 MED ORDER — MORPHINE SULFATE (PF) 2 MG/ML IV SOLN
1.0000 mg | INTRAVENOUS | Status: DC | PRN
Start: 2016-01-18 — End: 2016-01-22
  Administered 2016-01-19 – 2016-01-21 (×2): 1 mg via INTRAVENOUS
  Filled 2016-01-18 (×2): qty 1

## 2016-01-18 MED ORDER — TIMOLOL MALEATE 0.5 % OP SOLN
1.0000 [drp] | Freq: Every day | OPHTHALMIC | Status: DC
  Administered 2016-01-19 – 2016-01-22 (×3): 1 [drp] via OPHTHALMIC
  Filled 2016-01-18: qty 5

## 2016-01-18 NOTE — Progress Notes (Signed)
Chief Complaint  Patient presents with  . Follow-up    ER follow up Left Patellar fracture   HPI 82 year old female presents for evaluation of a left knee injury.  Patient complains of left knee pain Anterior knee pain severe. Dull aching pain Constant She cannot ambulate on her own with weightbearing on that left leg  Review of Systems  Constitutional: Negative for fever.  Eyes: Negative for blurred vision.  Respiratory: Negative for shortness of breath.   Cardiovascular: Negative for chest pain.  Gastrointestinal: Negative for heartburn.  Genitourinary: Negative for dysuria.  Musculoskeletal: Positive for falls.  Skin: Negative for itching and rash.  Neurological: Negative for dizziness and headaches.  Endo/Heme/Allergies: Negative for polydipsia. Does not bruise/bleed easily.  Psychiatric/Behavioral: Negative for depression.    Past Medical History  Diagnosis Date  . Osteoporosis   . Hypertension   . High cholesterol   . Irritable bowel syndrome   . Palpitations   . Glaucoma     Past Surgical History  Procedure Laterality Date  . Eye surgery    . Breast surgery    . Abdominal hysterectomy     FAMILY HISTORY The patient's mother died at 63 from massive heart attack father died at 45 from a stroke  Social History  Substance Use Topics  . Smoking status: Never Smoker   . Smokeless tobacco: Not on file  . Alcohol Use: No    Current outpatient prescriptions:  .  alendronate (FOSAMAX) 70 MG tablet, Take 70 mg by mouth once a week. Take with a full glass of water on an empty stomach.Takes on Monday, Disp: , Rfl:  .  atorvastatin (LIPITOR) 20 MG tablet, Take 20 mg by mouth daily., Disp: , Rfl:  .  Calcium Carb-Cholecalciferol (CALCIUM 600 + D PO), Take 1 tablet by mouth daily., Disp: , Rfl:  .  clonazePAM (KLONOPIN) 1 MG tablet, Take 1 mg by mouth 3 (three) times daily. , Disp: , Rfl:  .  dexlansoprazole (DEXILANT) 60 MG capsule, Take 60 mg by mouth daily., Disp:  , Rfl:  .  diphenoxylate-atropine (LOMOTIL) 2.5-0.025 MG tablet, Take 1 tablet by mouth 4 (four) times daily as needed for diarrhea or loose stools., Disp: , Rfl:  .  docusate sodium (COLACE) 100 MG capsule, Take 1 capsule (100 mg total) by mouth 2 (two) times daily as needed for mild constipation., Disp: 20 capsule, Rfl: 0 .  fexofenadine (ALLEGRA) 180 MG tablet, Take 180 mg by mouth daily., Disp: , Rfl:  .  Fluticasone-Salmeterol (ADVAIR) 250-50 MCG/DOSE AEPB, Inhale 1 puff into the lungs daily., Disp: , Rfl:  .  gabapentin (NEURONTIN) 100 MG capsule, Take 100 mg by mouth 2 (two) times daily., Disp: , Rfl:  .  hydrochlorothiazide (HYDRODIURIL) 25 MG tablet, Take 25 mg by mouth daily., Disp: , Rfl:  .  HYDROcodone-acetaminophen (NORCO/VICODIN) 5-325 MG tablet, Take 1 tablet by mouth 2 (two) times daily as needed for moderate pain., Disp: , Rfl:  .  latanoprost (XALATAN) 0.005 % ophthalmic solution, Place 1 drop into both eyes at bedtime., Disp: , Rfl:  .  meclizine (ANTIVERT) 25 MG tablet, Take 25 mg by mouth 3 (three) times daily as needed for dizziness., Disp: , Rfl:  .  oxyCODONE-acetaminophen (PERCOCET/ROXICET) 5-325 MG tablet, Take 1 tablet by mouth every 4 (four) hours as needed for severe pain., Disp: 20 tablet, Rfl: 0 .  promethazine (PHENERGAN) 25 MG tablet, Take 25 mg by mouth every 6 (six) hours as needed for nausea or  vomiting., Disp: , Rfl:  .  propranolol (INDERAL) 60 MG tablet, Take 60 mg by mouth 2 (two) times daily., Disp: , Rfl:  .  timolol (BETIMOL) 0.5 % ophthalmic solution, Place 1 drop into the right eye daily., Disp: , Rfl:  .  venlafaxine XR (EFFEXOR-XR) 150 MG 24 hr capsule, Take 150 mg by mouth daily with breakfast., Disp: , Rfl:   BP 119/62 mmHg  Pulse 67  Ht 5\' 3"  (1.6 m)  Wt 147 lb (66.679 kg)  BMI 26.05 kg/m2  Physical Exam  Constitutional: She is oriented to person, place, and time. She appears well-developed and well-nourished. No distress.  Cardiovascular:  Normal rate and intact distal pulses.   Neurological: She is alert and oriented to person, place, and time. She has normal reflexes. She exhibits normal muscle tone. Coordination normal.  Skin: Skin is warm and dry. No rash noted. She is not diaphoretic. No erythema. No pallor.  She has considerable ecchymosis in the injured leg with ecchymosis tracking into the proximal thigh around the knee and distally  Psychiatric: She has a normal mood and affect. Her behavior is normal. Judgment and thought content normal.    Ortho Exam  Again her ambulatory status is complete assist her son's basically pick her up   Upper extremities alignment is normal she has full range of motion all joints are stable and reduced muscle tone is normal skin is without laceration radial artery pulses normal capillary refill in the fingers is normal and sensation is normal  Right lower extremity inspection reveals no contracture subluxation atrophy tremor or tenderness range of motion is full in the hip and knee and ankle joints are stable muscle tone is normal skin is intact pulses are normal temperature normal no peripheral edema sensation is normal No lymphadenopathy.  There are no pathologic reflexes and she has a 2+ to 1+ reflex at the knee  2+ reflexes in the biceps  Could not test balance but upper extremity coordination was normal  Left knee Skin ecchymotic thigh and tibial area tenderness and defect in the patella limb alignment is normal she has no extension power her knee is stable she flexes to 90 and extends passively to 0 distal pulses are intact lymph nodes in the groin are negative sensation is normal no pathologic reflexes were tested on this side because of the fracture  ASSESSMENT: My personal interpretation of the images:  The knee x-rays do show a transverse fracture of the patella with major displacement  Closed fracture  PLAN Direct admit and surgery tomorrow open treatment internal  fixation of the left patella  This procedure has been fully reviewed with the patient and written informed consent has been obtained. These are some of the things that were discussed Bleeding Infection Later hardware removal Stiffness Blood clot Pulmonary embolus

## 2016-01-18 NOTE — Progress Notes (Signed)
Notified Dr Romeo AppleHarrison of pt arrival to floor.

## 2016-01-18 NOTE — H&P (Signed)
Chief Complaint   Patient presents with   .  Follow-up       ER follow up Left Patellar fracture    HPI 82 year old female presents for evaluation of a left knee injury.  Patient complains of left knee pain Anterior knee pain severe. Dull aching pain Constant She cannot ambulate on her own with weightbearing on that left leg  Review of Systems  Constitutional: Negative for fever.  Eyes: Negative for blurred vision.  Respiratory: Negative for shortness of breath.   Cardiovascular: Negative for chest pain.  Gastrointestinal: Negative for heartburn.  Genitourinary: Negative for dysuria.  Musculoskeletal: Positive for falls.  Skin: Negative for itching and rash.  Neurological: Negative for dizziness and headaches.  Endo/Heme/Allergies: Negative for polydipsia. Does not bruise/bleed easily.  Psychiatric/Behavioral: Negative for depression.      Past Medical History   Diagnosis  Date   .  Osteoporosis     .  Hypertension     .  High cholesterol     .  Irritable bowel syndrome     .  Palpitations     .  Glaucoma         Past Surgical History   Procedure  Laterality  Date   .  Eye surgery       .  Breast surgery       .  Abdominal hysterectomy        FAMILY HISTORY The patient's mother died at 39 from massive heart attack father died at 36 from a stroke    Social History   Substance Use Topics   .  Smoking status:  Never Smoker    .  Smokeless tobacco:  Not on file   .  Alcohol Use:  No     Current outpatient prescriptions:   .  alendronate (FOSAMAX) 70 MG tablet, Take 70 mg by mouth once a week. Take with a full glass of water on an empty stomach.Takes on Monday, Disp: , Rfl:   .  atorvastatin (LIPITOR) 20 MG tablet, Take 20 mg by mouth daily., Disp: , Rfl:   .  Calcium Carb-Cholecalciferol (CALCIUM 600 + D PO), Take 1 tablet by mouth daily., Disp: , Rfl:   .  clonazePAM (KLONOPIN) 1 MG tablet, Take 1 mg by mouth 3 (three) times daily. , Disp: , Rfl:   .   dexlansoprazole (DEXILANT) 60 MG capsule, Take 60 mg by mouth daily., Disp: , Rfl:   .  diphenoxylate-atropine (LOMOTIL) 2.5-0.025 MG tablet, Take 1 tablet by mouth 4 (four) times daily as needed for diarrhea or loose stools., Disp: , Rfl:   .  docusate sodium (COLACE) 100 MG capsule, Take 1 capsule (100 mg total) by mouth 2 (two) times daily as needed for mild constipation., Disp: 20 capsule, Rfl: 0 .  fexofenadine (ALLEGRA) 180 MG tablet, Take 180 mg by mouth daily., Disp: , Rfl:   .  Fluticasone-Salmeterol (ADVAIR) 250-50 MCG/DOSE AEPB, Inhale 1 puff into the lungs daily., Disp: , Rfl:   .  gabapentin (NEURONTIN) 100 MG capsule, Take 100 mg by mouth 2 (two) times daily., Disp: , Rfl:   .  hydrochlorothiazide (HYDRODIURIL) 25 MG tablet, Take 25 mg by mouth daily., Disp: , Rfl:   .  HYDROcodone-acetaminophen (NORCO/VICODIN) 5-325 MG tablet, Take 1 tablet by mouth 2 (two) times daily as needed for moderate pain., Disp: , Rfl:   .  latanoprost (XALATAN) 0.005 % ophthalmic solution, Place 1 drop into both eyes at bedtime., Disp: ,  Rfl:   .  meclizine (ANTIVERT) 25 MG tablet, Take 25 mg by mouth 3 (three) times daily as needed for dizziness., Disp: , Rfl:   .  oxyCODONE-acetaminophen (PERCOCET/ROXICET) 5-325 MG tablet, Take 1 tablet by mouth every 4 (four) hours as needed for severe pain., Disp: 20 tablet, Rfl: 0 .  promethazine (PHENERGAN) 25 MG tablet, Take 25 mg by mouth every 6 (six) hours as needed for nausea or vomiting., Disp: , Rfl:   .  propranolol (INDERAL) 60 MG tablet, Take 60 mg by mouth 2 (two) times daily., Disp: , Rfl:   .  timolol (BETIMOL) 0.5 % ophthalmic solution, Place 1 drop into the right eye daily., Disp: , Rfl:   .  venlafaxine XR (EFFEXOR-XR) 150 MG 24 hr capsule, Take 150 mg by mouth daily with breakfast., Disp: , Rfl:   BP 119/62 mmHg  Pulse 67  Ht  (1.6 m)  Wt 147 lb (66.679 kg)  BMI 26.05 kg/m2  Physical Exam  Constitutional: She is oriented to person, place,  and time. She appears well-developed and well-nourished. No distress.  Cardiovascular: Normal rate and intact distal pulses.   Neurological: She is alert and oriented to person, place, and time. She has normal reflexes. She exhibits normal muscle tone. Coordination normal.  Skin: Skin is warm and dry. No rash noted. She is not diaphoretic. No erythema. No pallor.  She has considerable ecchymosis in the injured leg with ecchymosis tracking into the proximal thigh around the knee and distally  Psychiatric: She has a normal mood and affect. Her behavior is normal. Judgment and thought content normal.    Ortho Exam  Again her ambulatory status is complete assist her son's basically pick her up   Upper extremities alignment is normal she has full range of motion all joints are stable and reduced muscle tone is normal skin is without laceration radial artery pulses normal capillary refill in the fingers is normal and sensation is normal  Right lower extremity inspection reveals no contracture subluxation atrophy tremor or tenderness range of motion is full in the hip and knee and ankle joints are stable muscle tone is normal skin is intact pulses are normal temperature normal no peripheral edema sensation is normal No lymphadenopathy.  There are no pathologic reflexes and she has a 2+ to 1+ reflex at the knee  2+ reflexes in the biceps  Could not test balance but upper extremity coordination was normal  Left knee Skin ecchymotic thigh and tibial area tenderness and defect in the patella limb alignment is normal she has no extension power her knee is stable she flexes to 90 and extends passively to 0 distal pulses are intact lymph nodes in the groin are negative sensation is normal no pathologic reflexes were tested on this side because of the fracture  ASSESSMENT: My personal interpretation of the images:   The knee x-rays do show a transverse fracture of the patella with major  displacement  Closed fracture  PLAN Direct admit and surgery tomorrow open treatment internal fixation of the left patella  This procedure has been fully reviewed with the patient and written informed consent has been obtained. These are some of the things that were discussed Bleeding Infection Later hardware removal Stiffness Blood clot Pulmonary embolus           FINDINGS: Moderate arthritis. There is a horizontal fracture through the center of the patella with slight displacement of superior and inferior fracture fragments insignificant overlying soft tissue  swelling. There is anticipated joint effusion.   IMPRESSION: Patellar fracture     Electronically Signed   By: Esperanza Heiraymond  Rubner M.D.   On: 01/17/2016 17:12

## 2016-01-18 NOTE — Patient Instructions (Signed)
Go straight to [facility] for [facility]

## 2016-01-19 ENCOUNTER — Encounter (HOSPITAL_COMMUNITY)
Admission: AD | Disposition: A | Discharge status: Skilled Nursing Facility | Payer: Self-pay | Source: Ambulatory Visit | Attending: Orthopedic Surgery

## 2016-01-19 ENCOUNTER — Inpatient Hospital Stay (HOSPITAL_COMMUNITY): Payer: Medicare Other | Admitting: Anesthesiology

## 2016-01-19 ENCOUNTER — Encounter (HOSPITAL_COMMUNITY): Payer: Self-pay | Admitting: *Deleted

## 2016-01-19 ENCOUNTER — Inpatient Hospital Stay (HOSPITAL_COMMUNITY): Payer: Medicare Other

## 2016-01-19 DIAGNOSIS — S82002A Unspecified fracture of left patella, initial encounter for closed fracture: Secondary | ICD-10-CM | POA: Insufficient documentation

## 2016-01-19 HISTORY — PX: ORIF PATELLA: SHX5033

## 2016-01-19 SURGERY — OPEN REDUCTION INTERNAL FIXATION (ORIF) PATELLA
Anesthesia: Spinal | Site: Knee | Laterality: Left

## 2016-01-19 MED ORDER — BUPIVACAINE IN DEXTROSE 0.75-8.25 % IT SOLN
INTRATHECAL | Status: AC
  Filled 2016-01-19: qty 2

## 2016-01-19 MED ORDER — MOMETASONE FURO-FORMOTEROL FUM 200-5 MCG/ACT IN AERO
INHALATION_SPRAY | RESPIRATORY_TRACT | Status: AC
  Filled 2016-01-19: qty 8.8

## 2016-01-19 MED ORDER — FENTANYL CITRATE (PF) 100 MCG/2ML IJ SOLN: 25.0000 ug | INTRAMUSCULAR | Status: DC | PRN

## 2016-01-19 MED ORDER — ONDANSETRON HCL 4 MG/2ML IJ SOLN
4.0000 mg | Freq: Once | INTRAMUSCULAR | Status: DC | PRN
Start: 2016-01-19 — End: 2016-01-19

## 2016-01-19 MED ORDER — BUPIVACAINE-EPINEPHRINE (PF) 0.5% -1:200000 IJ SOLN
INTRAMUSCULAR | Status: DC | PRN
  Administered 2016-01-19: 60 mL via PERINEURAL

## 2016-01-19 MED ORDER — MIDAZOLAM HCL 2 MG/2ML IJ SOLN
1.0000 mg | INTRAMUSCULAR | Status: DC | PRN
  Administered 2016-01-19: 2 mg via INTRAVENOUS

## 2016-01-19 MED ORDER — HEPARIN SODIUM (PORCINE) 5000 UNIT/ML IJ SOLN
5000.0000 [IU] | Freq: Three times a day (TID) | INTRAMUSCULAR | Status: DC
  Administered 2016-01-19 – 2016-01-22 (×10): 5000 [IU] via SUBCUTANEOUS
  Filled 2016-01-19 (×10): qty 1

## 2016-01-19 MED ORDER — PROPOFOL 500 MG/50ML IV EMUL
INTRAVENOUS | Status: DC | PRN
  Administered 2016-01-19: 25 ug/kg/min via INTRAVENOUS

## 2016-01-19 MED ORDER — METOCLOPRAMIDE HCL 10 MG PO TABS: 5.0000 mg | ORAL_TABLET | Freq: Three times a day (TID) | ORAL | Status: DC | PRN

## 2016-01-19 MED ORDER — MIDAZOLAM HCL 5 MG/5ML IJ SOLN
INTRAMUSCULAR | Status: DC | PRN
  Administered 2016-01-19: 0.5 mg via INTRAVENOUS

## 2016-01-19 MED ORDER — FENTANYL CITRATE (PF) 100 MCG/2ML IJ SOLN
INTRAMUSCULAR | Status: AC
  Filled 2016-01-19: qty 2

## 2016-01-19 MED ORDER — MIDAZOLAM HCL 2 MG/2ML IJ SOLN
INTRAMUSCULAR | Status: AC
  Filled 2016-01-19: qty 2

## 2016-01-19 MED ORDER — ACETAMINOPHEN 325 MG PO TABS
650.0000 mg | ORAL_TABLET | Freq: Four times a day (QID) | ORAL | Status: DC | PRN
  Administered 2016-01-22: 650 mg via ORAL
  Filled 2016-01-19 (×2): qty 2

## 2016-01-19 MED ORDER — BUPIVACAINE IN DEXTROSE 0.75-8.25 % IT SOLN
INTRATHECAL | Status: DC | PRN
  Administered 2016-01-19: 15 mg via INTRATHECAL

## 2016-01-19 MED ORDER — FENTANYL CITRATE (PF) 100 MCG/2ML IJ SOLN
25.0000 ug | INTRAMUSCULAR | Status: DC
  Administered 2016-01-19: 25 ug via INTRAVENOUS

## 2016-01-19 MED ORDER — ONDANSETRON HCL 4 MG/2ML IJ SOLN
4.0000 mg | Freq: Four times a day (QID) | INTRAMUSCULAR | Status: DC | PRN
  Administered 2016-01-19: 4 mg via INTRAVENOUS
  Filled 2016-01-19: qty 2

## 2016-01-19 MED ORDER — POLYETHYLENE GLYCOL 3350 17 G PO PACK
17.0000 g | PACK | Freq: Every day | ORAL | Status: DC
  Administered 2016-01-19 – 2016-01-22 (×4): 17 g via ORAL
  Filled 2016-01-19 (×4): qty 1

## 2016-01-19 MED ORDER — PHENOL 1.4 % MT LIQD: 1.0000 | OROMUCOSAL | Status: DC | PRN

## 2016-01-19 MED ORDER — SODIUM CHLORIDE 0.9 % IV SOLN
INTRAVENOUS | Status: DC
  Administered 2016-01-19: 16:00:00 via INTRAVENOUS

## 2016-01-19 MED ORDER — BUPIVACAINE-EPINEPHRINE (PF) 0.5% -1:200000 IJ SOLN
INTRAMUSCULAR | Status: AC
  Filled 2016-01-19: qty 60

## 2016-01-19 MED ORDER — FENTANYL CITRATE (PF) 100 MCG/2ML IJ SOLN
INTRAMUSCULAR | Status: DC | PRN
Start: 2016-01-19 — End: 2016-01-19
  Administered 2016-01-19: 25 ug via INTRATHECAL

## 2016-01-19 MED ORDER — SODIUM CHLORIDE 0.9 % IR SOLN
Status: DC | PRN
  Administered 2016-01-19 (×2): 1000 mL

## 2016-01-19 MED ORDER — ACETAMINOPHEN 650 MG RE SUPP: 650.0000 mg | Freq: Four times a day (QID) | RECTAL | Status: DC | PRN

## 2016-01-19 MED ORDER — EPHEDRINE SULFATE 50 MG/ML IJ SOLN
INTRAMUSCULAR | Status: DC | PRN
  Administered 2016-01-19 (×2): 10 mg via INTRAVENOUS

## 2016-01-19 MED ORDER — CLINDAMYCIN PHOSPHATE 600 MG/50ML IV SOLN
600.0000 mg | Freq: Four times a day (QID) | INTRAVENOUS | Status: AC
  Administered 2016-01-19 – 2016-01-20 (×2): 600 mg via INTRAVENOUS
  Filled 2016-01-19 (×2): qty 50

## 2016-01-19 MED ORDER — MENTHOL 3 MG MT LOZG: 1.0000 | LOZENGE | OROMUCOSAL | Status: DC | PRN

## 2016-01-19 MED ORDER — METOCLOPRAMIDE HCL 5 MG/ML IJ SOLN: 5.0000 mg | Freq: Three times a day (TID) | INTRAMUSCULAR | Status: DC | PRN

## 2016-01-19 MED ORDER — ONDANSETRON HCL 4 MG PO TABS: 4.0000 mg | ORAL_TABLET | Freq: Four times a day (QID) | ORAL | Status: DC | PRN

## 2016-01-19 MED ORDER — LACTATED RINGERS IV SOLN
INTRAVENOUS | Status: DC
  Administered 2016-01-19: 14:00:00 via INTRAVENOUS

## 2016-01-19 MED ORDER — ASPIRIN EC 325 MG PO TBEC
325.0000 mg | DELAYED_RELEASE_TABLET | Freq: Every day | ORAL | Status: DC
  Administered 2016-01-20 – 2016-01-22 (×3): 325 mg via ORAL
  Filled 2016-01-19 (×3): qty 1

## 2016-01-19 MED ORDER — POTASSIUM CHLORIDE 10 MEQ/100ML IV SOLN
10.0000 meq | INTRAVENOUS | Status: AC
  Administered 2016-01-19 (×3): 10 meq via INTRAVENOUS
  Filled 2016-01-19 (×3): qty 100

## 2016-01-19 SURGICAL SUPPLY — 57 items
BAG HAMPER (MISCELLANEOUS) ×3 IMPLANT
BANDAGE ELASTIC 4 VELCRO NS (GAUZE/BANDAGES/DRESSINGS) ×3 IMPLANT
BANDAGE ELASTIC 6 VELCRO NS (GAUZE/BANDAGES/DRESSINGS) ×3 IMPLANT
BANDAGE ESMARK 6X9 LF (GAUZE/BANDAGES/DRESSINGS) ×1 IMPLANT
BIT DRILL 2.8X128 (BIT) IMPLANT
BIT DRILL 2.8X128MM (BIT)
BLADE SURG SZ10 CARB STEEL (BLADE) ×3 IMPLANT
BNDG CMPR 9X6 STRL LF SNTH (GAUZE/BANDAGES/DRESSINGS) ×1
BNDG COHESIVE 4X5 TAN STRL (GAUZE/BANDAGES/DRESSINGS) ×3 IMPLANT
BNDG ESMARK 6X9 LF (GAUZE/BANDAGES/DRESSINGS) ×3
CHLORAPREP W/TINT 26ML (MISCELLANEOUS) ×3 IMPLANT
CLOTH BEACON ORANGE TIMEOUT ST (SAFETY) ×3 IMPLANT
COVER LIGHT HANDLE STERIS (MISCELLANEOUS) ×6 IMPLANT
CUFF TOURNIQUET SINGLE 34IN LL (TOURNIQUET CUFF) ×3 IMPLANT
CUFF TOURNIQUET SINGLE 44IN (TOURNIQUET CUFF) IMPLANT
DECANTER SPIKE VIAL GLASS SM (MISCELLANEOUS) ×6 IMPLANT
DRAPE C-ARM FOLDED MOBILE STRL (DRAPES) ×3 IMPLANT
DRAPE INCISE IOBAN 66X45 STRL (DRAPES) ×3 IMPLANT
DRAPE PROXIMA HALF (DRAPES) ×3 IMPLANT
DRSG MEPILEX BORDER 4X12 (GAUZE/BANDAGES/DRESSINGS) ×3 IMPLANT
ELECT REM PT RETURN 9FT ADLT (ELECTROSURGICAL) ×3
ELECTRODE REM PT RTRN 9FT ADLT (ELECTROSURGICAL) ×1 IMPLANT
GAUZE SPONGE 4X4 12PLY STRL (GAUZE/BANDAGES/DRESSINGS) IMPLANT
GLOVE BIOGEL PI IND STRL 7.0 (GLOVE) ×3 IMPLANT
GLOVE BIOGEL PI INDICATOR 7.0 (GLOVE) ×6
GLOVE ECLIPSE 6.5 STRL STRAW (GLOVE) ×9 IMPLANT
GLOVE ECLIPSE 7.0 STRL STRAW (GLOVE) ×3 IMPLANT
GLOVE EXAM NITRILE MD LF STRL (GLOVE) ×3 IMPLANT
GLOVE SKINSENSE NS SZ8.0 LF (GLOVE) ×4
GLOVE SKINSENSE STRL SZ8.0 LF (GLOVE) ×2 IMPLANT
GLOVE SS N UNI LF 8.5 STRL (GLOVE) ×3 IMPLANT
GOWN STRL REUS W/TWL LRG LVL3 (GOWN DISPOSABLE) ×9 IMPLANT
GOWN STRL REUS W/TWL XL LVL3 (GOWN DISPOSABLE) ×3 IMPLANT
IMMOBILIZER KNEE 19 UNV (ORTHOPEDIC SUPPLIES) IMPLANT
INST SET MINOR BONE (KITS) ×3 IMPLANT
K-WIRE DBL PT .062 (WIRE) IMPLANT
KIT ROOM TURNOVER APOR (KITS) ×3 IMPLANT
MANIFOLD NEPTUNE II (INSTRUMENTS) ×3 IMPLANT
NEEDLE HYPO 21X1.5 SAFETY (NEEDLE) ×3 IMPLANT
NS IRRIG 1000ML POUR BTL (IV SOLUTION) ×6 IMPLANT
PACK BASIC LIMB (CUSTOM PROCEDURE TRAY) ×3 IMPLANT
PAD ARMBOARD 7.5X6 YLW CONV (MISCELLANEOUS) ×3 IMPLANT
PASSER SUT SWANSON 36MM LOOP (INSTRUMENTS) ×3 IMPLANT
SET BASIN LINEN APH (SET/KITS/TRAYS/PACK) ×3 IMPLANT
SPONGE LAP 18X18 X RAY DECT (DISPOSABLE) ×6 IMPLANT
STAPLER VISISTAT 35W (STAPLE) IMPLANT
SUT BRALON NAB BRD #1 30IN (SUTURE) ×6 IMPLANT
SUT ETHIBOND 5 LR DA (SUTURE) ×6 IMPLANT
SUT MON AB 0 CT1 (SUTURE) ×6 IMPLANT
SUT MON AB 2-0 CT1 36 (SUTURE) IMPLANT
SUT VIC AB 1 CT1 27 (SUTURE) ×6
SUT VIC AB 1 CT1 27XBRD ANTBC (SUTURE) ×2 IMPLANT
SUT WIRE 18GA (Wire) ×3 IMPLANT
SYR 30ML LL (SYRINGE) ×3 IMPLANT
SYR BULB IRRIGATION 50ML (SYRINGE) ×3 IMPLANT
TOWEL OR 17X26 4PK STRL BLUE (TOWEL DISPOSABLE) ×3 IMPLANT
single trocar pt k-wrie 0.062" x 9" ×6 IMPLANT

## 2016-01-19 NOTE — Anesthesia Procedure Notes (Signed)
Spinal Patient location during procedure: OR Start time: 01/19/2016 1:10 PM Staffing Resident/CRNA: Donda Friedli J Performed by: resident/CRNA  Preanesthetic Checklist Completed: patient identified, site marked, surgical consent, pre-op evaluation, timeout performed, IV checked, risks and benefits discussed and monitors and equipment checked Spinal Block Patient position: left lateral decubitus Prep: Betadine Patient monitoring: heart rate, cardiac monitor, continuous pulse ox and blood pressure Approach: left paramedian Location: L2-3 Injection technique: single-shot Needle Needle type: Spinocan  Needle gauge: 22 G Assessment Sensory level: T8 Additional Notes  CSF slow and clear      [id number]      12/2016

## 2016-01-19 NOTE — Op Note (Signed)
Operative report the 01/19/2016  .Romeo AppleHarrison dictating open treatment internal fixation left patella fracture  Diagnosis closed left patella fracture  Preoperative postop diagnosis closed left patella fracture  Operative findings transverse patella fracture  Anesthesia spinal  Blood loss minimal  Counts correct  Assisted by [facility] NationBetty Ashley  Details of procedure  The patient was identified in the preop holding area the left leg was marked for surgery and signed as left patella fracture because of brace was already on the knee and we did not want to remove it for patient comfort  After thorough chart review the patient was taken to the operating room for spinal anesthesia. She received appropriate antibiotics based on her penicillin allergy  After sterile prep and drape of the leg on the left side timeout was completed. The limb was exsanguinated with a six-inch Esmarch.   The tourniquet was elevated to 300 mmHg  I made a midline incision to that down to the fascia. The patella fracture was identified and debrided the fracture hematoma the joint was irrigated and there were no loose fragments of bone in the joint  A large reduction clamp was placed multiple C-arm x-rays were obtained until an adequate reduction was obtained. We placed 2 K wires in the patella we placed a 18-gauge wire around the K wires with a figure-of-eight configuration we took x-rays again the fracture was reduced we took the knee through a range of motion and got good apposition with flexion of the knee up to 90  We can bend the knee 90 for the first 6 weeks without any problem  Appropriate wire bending was done in the tension band wire was buried.  The retinaculum was closed with #1 Vicryl in the medial and lateral side and then a #5 Ethibond suture was wrapped in circumferential manner to provide extra tension released to the construct  After appropriate irrigation 0 Monocryl suture was used to close the  subcutaneous tissue and then staples were used to close the skin  The knee was immobilized in extension with a sterile dressing and the patient was taken recovery room in stable condition with the following postop plan  Weightbearing is as tolerated in a brace with a walker and assistance Staples can come out it 2 weeks X-ray at 2 weeks  At 2 weeks if everything was going up appropriately a hinged knee brace 0-90 can be placed in the patient can continue weightbearing as tolerated  X-ray at 6 weeks  X-ray again at 12 weeks

## 2016-01-19 NOTE — Anesthesia Preprocedure Evaluation (Signed)
Anesthesia Evaluation  Patient identified by MRN, date of birth, ID band Patient awake    Reviewed: Allergy & Precautions, NPO status , Patient's Chart, lab work & pertinent test results, reviewed documented beta blocker date and time   Airway Mallampati: II  TM Distance: >3 FB     Dental  (+) Edentulous Upper, Edentulous Lower   Pulmonary neg pulmonary ROS,    breath sounds clear to auscultation       Cardiovascular hypertension, Pt. on home beta blockers and Pt. on medications  Rhythm:Regular Rate:Normal     Neuro/Psych    GI/Hepatic negative GI ROS,   Endo/Other    Renal/GU      Musculoskeletal   Abdominal   Peds  Hematology   Anesthesia Other Findings   Reproductive/Obstetrics                             Anesthesia Physical Anesthesia Plan  ASA: III  Anesthesia Plan: Spinal   Post-op Pain Management:    Induction: Intravenous  Airway Management Planned: Simple Face Mask  Additional Equipment:   Intra-op Plan:   Post-operative Plan:   Informed Consent: I have reviewed the patients History and Physical, chart, labs and discussed the procedure including the risks, benefits and alternatives for the proposed anesthesia with the patient or authorized representative who has indicated his/her understanding and acceptance.     Plan Discussed with:   Anesthesia Plan Comments:         Anesthesia Quick Evaluation

## 2016-01-19 NOTE — Care Management Important Message (Signed)
Important Message  Patient Details  Name: Bonnie Rush MRN: [id number] Date of Birth: 1933/09/28   Medicare Important Message Given:  Yes    Adonis HugueninBerkhead, Jonaya Freshour L, RN 01/19/2016, 9:21 AM

## 2016-01-19 NOTE — Brief Op Note (Signed)
01/18/2016 - 01/19/2016  2:51 PM  PATIENT:  Bonnie Rush  82 y.o. female  PRE-OPERATIVE DIAGNOSIS:  CLOSED LEFT PATELLA FRACTURE  POST-OPERATIVE DIAGNOSIS:  CLOSED LEFT PATELLA FRACTURE  PROCEDURE:  Procedure(s): OPEN REDUCTION INTERNAL (ORIF) FIXATION LEFT PATELLA  FRACTURE (Left)  SURGEON:  Surgeon(s) and Role:    * Vickki HearingStanley E Mansoor Hillyard, MD - Primary  PHYSICIAN ASSISTANT:   ASSISTANTS: betty ashley   ANESTHESIA:   spinal  EBL:  Total I/O In: 1400 [I.V.:1400] Out: 450 [Urine:450]  BLOOD ADMINISTERED:none  DRAINS: none   LOCAL MEDICATIONS USED:  MARCAINE     SPECIMEN:  No Specimen  DISPOSITION OF SPECIMEN:  N/A  COUNTS:  YES  TOURNIQUET:   Total Tourniquet Time Documented: Thigh (Left) - 58 minutes Total: Thigh (Left) - 58 minutes   DICTATION: .Reubin Milanragon Dictation  PLAN OF CARE: Admit to inpatient   PATIENT DISPOSITION:  PACU - hemodynamically stable.   Delay start of Pharmacological VTE agent (>24hrs) due to surgical blood loss or risk of bleeding: yes

## 2016-01-19 NOTE — Anesthesia Postprocedure Evaluation (Signed)
Anesthesia Post Note  Patient: Bonnie Rush  Procedure(s) Performed: Procedure(s) (LRB): OPEN REDUCTION INTERNAL (ORIF) FIXATION LEFT PATELLA  FRACTURE (Left)  Patient location during evaluation: PACU Anesthesia Type: Spinal Level of consciousness: awake, oriented and patient cooperative Pain management: pain level controlled Vital Signs Assessment: post-procedure vital signs reviewed and stable Respiratory status: spontaneous breathing, nonlabored ventilation and respiratory function stable Cardiovascular status: blood pressure returned to baseline and stable Postop Assessment: no backache and no signs of nausea or vomiting Anesthetic complications: no    Last Vitals:  Filed Vitals:   01/19/16 0640 01/19/16 1231  BP: 120/62 137/65  Pulse: 64 63  Temp: 36.9 C 36.7 C  Resp: 18 20    Last Pain:  Filed Vitals:   01/19/16 1233  PainSc: 0-No pain                 Ortencia Askari J

## 2016-01-19 NOTE — Progress Notes (Signed)
Subjective: kneepain    Objective: Vital signs in last 24 hours: Temp:  [97.5 F (36.4 C)-98.5 F (36.9 C)] 98.5 F (36.9 C) (04/28 0640) Pulse Rate:  [60-67] 64 (04/28 0640) Resp:  [16-18] 18 (04/28 0640) BP: (115-120)/(48-64) 120/62 mmHg (04/28 0640) SpO2:  [88 %-97 %] 95 % (04/28 0640) Weight:  [147 lb (66.679 kg)-149 lb 8 oz (67.813 kg)] 149 lb 8 oz (67.813 kg) (04/27 1800)  Intake/Output from previous day: 04/27 0701 - 04/28 0700 In: 1018.3 [I.V.:1018.3] Out: -  Intake/Output this shift:     Recent Labs  01/18/16 1841  HGB 10.6*    Recent Labs  01/18/16 1841  WBC 7.5  RBC 3.52*  HCT 32.3*  PLT 234    Recent Labs  01/18/16 1841  NA 139  K 3.4*  CL 104  CO2 29  BUN 18  CREATININE 0.97  GLUCOSE 103*  CALCIUM 9.8    Recent Labs  01/18/16 1841  INR 1.03    Neurologically intact ABD soft Neurovascular intact Sensation intact distally Intact pulses distally Dorsiflexion/Plantar flexion intact  Assessment/Plan: Added k to iv  Surgery later today   Bonnie CanadaStanley Rush 01/19/2016, 8:11 AM

## 2016-01-19 NOTE — Transfer of Care (Signed)
Immediate Anesthesia Transfer of Care Note  Patient: Bonnie Rush  Procedure(s) Performed: Procedure(s): OPEN REDUCTION INTERNAL (ORIF) FIXATION LEFT PATELLA  FRACTURE (Left)  Patient Location: PACU  Anesthesia Type:Spinal  Level of Consciousness: awake and patient cooperative  Airway & Oxygen Therapy: Patient Spontanous Breathing and Patient connected to face mask oxygen  Post-op Assessment: Report given to RN and Post -op Vital signs reviewed and stable  Post vital signs: Reviewed and stable  Last Vitals:  Filed Vitals:   01/19/16 0640 01/19/16 1231  BP: 120/62 137/65  Pulse: 64 63  Temp: 36.9 C 36.7 C  Resp: 18 20    Last Pain:  Filed Vitals:   01/19/16 1233  PainSc: 0-No pain      Patients Stated Pain Goal: 7 (01/19/16 1231)  Complications: No apparent anesthesia complications

## 2016-01-20 LAB — BASIC METABOLIC PANEL
Anion gap: 8 (ref 5–15)
BUN: 9 mg/dL (ref 6–20)
CHLORIDE: 104 mmol/L (ref 101–111)
CO2: 27 mmol/L (ref 22–32)
Calcium: 8.9 mg/dL (ref 8.9–10.3)
Creatinine, Ser: 0.77 mg/dL (ref 0.44–1.00)
GFR calc Af Amer: >60 mL/min (ref >60)
GFR calc non Af Amer: >60 mL/min (ref >60)
GLUCOSE: 128 mg/dL — AB (ref 65–99)
POTASSIUM: 3 mmol/L — AB (ref 3.5–5.1)
Sodium: 139 mmol/L (ref 135–145)

## 2016-01-20 LAB — CBC
HEMATOCRIT: 30 % — AB (ref 36.0–46.0)
Hemoglobin: 9.8 g/dL — ABNORMAL LOW (ref 12.0–15.0)
MCH: 30.3 pg (ref 26.0–34.0)
MCHC: 32.7 g/dL (ref 30.0–36.0)
MCV: 92.9 fL (ref 78.0–100.0)
Platelets: 226 10*3/uL (ref 150–400)
RBC: 3.23 MIL/uL — ABNORMAL LOW (ref 3.87–5.11)
RDW: 12.8 % (ref 11.5–15.5)
WBC: 8.6 10*3/uL (ref 4.0–10.5)

## 2016-01-20 NOTE — Progress Notes (Signed)
Patient continues to remove foot pumps, patient educated and foot pumps reapplied x 3 times.

## 2016-01-20 NOTE — Care Management Obs Status (Signed)
MEDICARE OBSERVATION STATUS NOTIFICATION   Patient Details  Name: Bonnie Rush MRN: [id number] Date of Birth: 04/19/1934  Case Management witnessed family at bedside signed for patient. Medicare Observation Status Notification Given:  Yes    Fuller PlanWelborn, Vander Kueker M, RN 01/20/2016, 1:26 PM

## 2016-01-20 NOTE — Progress Notes (Signed)
Patient ID: Bonnie Rush, female   DOB: 1933/12/27, 82 y.o.   MRN: [id number]  Postop day 1 status post left patellar fracture open treatment internal fixation  The patient says she had a rough night with pain and she has IBS and she's not been able to have a bowel movement  She takes Imodium at home for her IBS   BP 150/56 mmHg  Pulse 57  Temp(Src) 98.6 F (37 C) (Oral)  Resp 17  Ht 5\' 3"  (1.6 m)  Wt 149 lb (67.586 kg)  BMI 26.40 kg/m2  SpO2 93%  She is moving her foot well she has good perfusion to the foot with good capillary refill  She has an ice pack over the left knee with an immobilizer in place  She was mentating normally  Recommend physical therapy. After their assessment I can determine if she can go home or she needs skilled nursing care

## 2016-01-20 NOTE — Plan of Care (Signed)
Problem: Acute Rehab PT Goals(only PT should resolve) Goal: Patient Will Transfer Sit To/From Stand Pt will transfer sit to/from-stand with RW at ModI without loss-of-balance to demonstrate good safety awareness for independent mobility in home.     Goal: Pt Will Ambulate Pt will ambulate with RW and minA for a distance greater than 3620ft to demonstrate the ability to perform safe transfers for toileting at discharge.

## 2016-01-20 NOTE — Evaluation (Signed)
Physical Therapy Evaluation Patient Details Name: Bonnie Rush MRN: [id number] DOB: [birth year] Today's Date: 01/20/2016   History of Present Illness  Bonnie Rush is an 82yo white female who comes to [facility] after sustaining a fall at home, resultant in the inability to WB on the LLE. Upon arrivlal, pt found to have L patella fracture and underwent ORIF. At baesline pt performs household ambulation only, is non compliant with DME suggestions from family, and has fallen an estimated 10x in the last several months (per daughter). The patient has significant visual impairment, and is suspected by family to be neglectful of ADL when not supervised.  One of the patient's sons lives with her, but cannot provide 24/7 assistance and has some physical disabilities that limit caregiver capabilities.     Clinical Impression  At evaluation, pt is received semirecumbent in bed upon entry, daughter and son present. The pt is awake and agreeable to participate, but somewhat confused. No acute distress noted at this time. The pt is alert and oriented to the year, month, city, state, and county, but cannot say what type of building she is currently in when given multiple choice. She has some confusion over her knee injury, but no recollection of the surgery or that she is in the hospital. The pt is pleasant, conversational, but showing some difficulty with following multi-step commands consistently, which is worse during mobility than it is with exercises in bed. Family reports multiple falls in the last 6 months, and pt demonstrates multiple LOB throughout session requiring physical assistance for correction and avoidance of fall, including needing physical assistance to maintain seated balance at EOB for ~3 minutes. Pt global strength as screened during functional mobility assessment presents with moderate impairment, the pt now requiring physical assistance for bed mobility, transfers, and gait, whereas the patient  performs these indep at baseline. Pivot transfers and attempted gait trial require heavy verbal cues for sequencing and safe walker use.   Patient presenting with impairment of strength, range of motion, balance, cognition, and activity tolerance, limiting ability to perform ADL and mobility tasks at  baseline level of function. Patient will benefit from skilled intervention to address the above impairments and limitations, in order to restore to prior level of function, improve patient safety upon discharge, and to decrease falls risk.       Follow Up Recommendations SNF;Supervision for mobility/OOB;Supervision/Assistance - 24 hour    Equipment Recommendations       Recommendations for Other Services       Precautions / Restrictions Precautions Required Braces or Orthoses: Knee Immobilizer - Left Knee Immobilizer - Left: On at all times Restrictions Weight Bearing Restrictions: Yes LLE Weight Bearing: Weight bearing as tolerated      Mobility  Bed Mobility Overal bed mobility: Needs Assistance Bed Mobility: Supine to Sit     Supine to sit: Max assist     General bed mobility comments: lots of difficulty following verbal cues, requires heavy physical assistance, and takes about 4 minutes before the patient is able to bring trunk over her center of support and balance indep.   Transfers Overall transfer level: Needs assistance Equipment used: Rolling walker (2 wheeled);1 person hand held assist Transfers: Sit to/from [employer] Sit to Stand: Min assist Stand pivot transfers: Min assist       General transfer comment: very unstable, difficulty following commands, somewhat confused, unable to weight bear due to postoperative pain.   Ambulation/Gait Ambulation/Gait assistance:  (Not appropriate at this time. )  Stairs            Wheelchair Mobility    Modified Rankin (Stroke Patients Only)       Balance Overall balance  assessment: Needs assistance;History of Falls   Sitting balance-Leahy Scale: Poor       Standing balance-Leahy Scale: Poor                               Pertinent Vitals/Pain Pain Assessment: 0-10 Pain Score: 6  Pain Location: Left knee  Pain Intervention(s): Limited activity within patient's tolerance;Monitored during session;Premedicated before session    Home Living Family/patient expects to be discharged to:: Skilled nursing facility Living Arrangements: Children                    Prior Function Level of Independence: Needs assistance   Gait / Transfers Assistance Needed: Multiple falls in home, HH amb only, does not use rollator that daughter bought for her.   ADL's / Homemaking Assistance Needed: some self neglect when not cued/supervised for bathing.         Hand Dominance        Extremity/Trunk Assessment               Lower Extremity Assessment: LLE deficits/detail   LLE Deficits / Details: Posoperative pain and weakness     Communication   Communication:  (Speech is tangential at times.)  Cognition Arousal/Alertness: Awake/alert Behavior During Therapy: WFL for tasks assessed/performed Overall Cognitive Status: History of cognitive impairments - at baseline (moreso confused than baseline)       Memory: Decreased recall of precautions;Decreased short-term memory              General Comments      Exercises Other Exercises Other Exercises: Abduction heel slides AAROM c ModA, Range self limited x15 Other Exercises: SLR AAROM c ModA, Range self limited x15 Other Exercises: Left-sided Hamstrings bridge c manual resistance x15      Assessment/Plan    PT Assessment Patient needs continued PT services  PT Diagnosis Difficulty walking;Generalized weakness;Acute pain   PT Problem List Decreased strength;Decreased mobility;Decreased range of motion;Decreased activity tolerance;Decreased balance;Decreased  cognition;Decreased knowledge of precautions;Pain  PT Treatment Interventions Gait training;Stair training;Therapeutic exercise;Patient/family education   PT Goals (Current goals can be found in the Care Plan section) Acute Rehab PT Goals Patient Stated Goal: reduce risk of future falls, improve patient mobility.  PT Goal Formulation: With family Time For Goal Achievement: 01/20/16 Potential to Achieve Goals: Fair    Frequency 7X/week   Barriers to discharge Inaccessible home environment;Decreased caregiver support Patient has 5 steps to enter mobile home, Son is unable to provide physical assistance due to disability.     Co-evaluation               End of Session Equipment Utilized During Treatment: Gait belt;Left knee immobilizer Activity Tolerance: Patient tolerated treatment well;Patient limited by pain;Other (comment) (confusion, difficulty following commnads. ) Patient left: in chair;with call bell/phone within reach;with bed alarm set;with family/visitor present;with SCD's reapplied Nurse Communication: Mobility status    Functional Assessment Tool Used: Clinical Judgment  Functional Limitation: Mobility: Walking and moving around Mobility: Walking and Moving Around Current Status ([id number](G8978): At least 60 percent but less than 80 percent impaired, limited or restricted Mobility: Walking and Moving Around Goal Status [phone](G8979): At least 60 percent but less than 80 percent impaired, limited or restricted    Time: [phone]  PT Time Calculation (min) (ACUTE ONLY): 43 min   Charges:   PT Evaluation $PT Eval High Complexity: 1 Procedure PT Treatments $Therapeutic Exercise: 8-22 mins $Therapeutic Activity: 8-22 mins   PT G Codes:   PT G-Codes **NOT FOR INPATIENT CLASS** Functional Assessment Tool Used: Clinical Judgment  Functional Limitation: Mobility: Walking and moving around Mobility: Walking and Moving Around Current Status (M8413): At least 60 percent but less than 80  percent impaired, limited or restricted Mobility: Walking and Moving Around Goal Status [phone]): At least 60 percent but less than 80 percent impaired, limited or restricted   9:58 PM, 01/20/2016 Rosamaria Lints, PT, DPT PRN Physical Therapist - [facility] [place] License # [id number] [phone] (mobile)

## 2016-01-21 LAB — CBC
HEMATOCRIT: 28.1 % — AB (ref 36.0–46.0)
HEMOGLOBIN: 9.1 g/dL — AB (ref 12.0–15.0)
MCH: 29.8 pg (ref 26.0–34.0)
MCHC: 32.4 g/dL (ref 30.0–36.0)
MCV: 92.1 fL (ref 78.0–100.0)
Platelets: 220 10*3/uL (ref 150–400)
RBC: 3.05 MIL/uL — ABNORMAL LOW (ref 3.87–5.11)
RDW: 13 % (ref 11.5–15.5)
WBC: 7.9 10*3/uL (ref 4.0–10.5)

## 2016-01-21 LAB — BASIC METABOLIC PANEL
ANION GAP: 7 (ref 5–15)
BUN: 11 mg/dL (ref 6–20)
CO2: 27 mmol/L (ref 22–32)
Calcium: 8.5 mg/dL — ABNORMAL LOW (ref 8.9–10.3)
Chloride: 103 mmol/L (ref 101–111)
Creatinine, Ser: 0.78 mg/dL (ref 0.44–1.00)
GFR calc Af Amer: >60 mL/min (ref >60)
GFR calc non Af Amer: >60 mL/min (ref >60)
GLUCOSE: 105 mg/dL — AB (ref 65–99)
POTASSIUM: 2.6 mmol/L — AB (ref 3.5–5.1)
SODIUM: 137 mmol/L (ref 135–145)

## 2016-01-21 LAB — GLUCOSE, CAPILLARY: Glucose-Capillary: 102 mg/dL — ABNORMAL HIGH (ref 65–99)

## 2016-01-21 MED ORDER — LATANOPROST 0.005 % OP SOLN
OPHTHALMIC | Status: AC
  Filled 2016-01-21: qty 2.5

## 2016-01-21 MED ORDER — POTASSIUM CHLORIDE CRYS ER 20 MEQ PO TBCR
20.0000 meq | EXTENDED_RELEASE_TABLET | Freq: Three times a day (TID) | ORAL | Status: DC
  Administered 2016-01-21 – 2016-01-22 (×5): 20 meq via ORAL
  Filled 2016-01-21 (×5): qty 1

## 2016-01-21 NOTE — Addendum Note (Signed)
Addendum  created 01/21/16 0846 by Earleen NewportAmy A Mertis Mosher, CRNA   Modules edited: Clinical Notes   Clinical Notes:  File: [id number]

## 2016-01-21 NOTE — Anesthesia Postprocedure Evaluation (Signed)
Anesthesia Post Note  Patient: Bonnie Rush  Procedure(s) Performed: Procedure(s) (LRB): OPEN REDUCTION INTERNAL (ORIF) FIXATION LEFT PATELLA  FRACTURE (Left)  Patient location during evaluation: Nursing Unit Anesthesia Type: Spinal Level of consciousness: awake and alert and confused Pain management: pain level controlled Vital Signs Assessment: post-procedure vital signs reviewed and stable Respiratory status: spontaneous breathing Cardiovascular status: stable Postop Assessment: no signs of nausea or vomiting and no headache Anesthetic complications: no    Last Vitals:  Filed Vitals:   01/20/16 1500 01/20/16 2040  BP: 110/43   Pulse: 74 78  Temp: 37.7 C   Resp: 18 16    Last Pain:  Filed Vitals:   01/21/16 0731  PainSc: Asleep                 Anja Neuzil A

## 2016-01-21 NOTE — Progress Notes (Signed)
PT Cancellation Note  Patient Details Name: Bonnie Rush MRN: [id number] DOB: 1933-11-30   Cancelled Treatment:    Reason Eval/Treat Not Completed: Patient not medically ready. Chart reviewed, K+: 2.6, outside of safe recommended range for participation c PT per [facility] policy. Will continue to monitor attempt treatment again at later date/time.    10:44 AM, 01/21/2016 Rosamaria LintsAllan C Buccola, PT, DPT PRN Physical Therapist - [facility] License # [id number] [phone](ASCOM)  [phone] (mobile)

## 2016-01-21 NOTE — Progress Notes (Signed)
Patient ID: Bonnie Rush, female   DOB: 03-Sep-1934, 82 y.o.   MRN: [id number]  BP 110/43 mmHg  Pulse 78  Temp(Src) 99.8 F (37.7 C) (Oral)  Resp 16  Ht 5\' 3"  (1.6 m)  Wt 149 lb (67.586 kg)  BMI 26.40 kg/m2  SpO2 95%  Postop day 2 left patella fracture open treatment internal fixation  The patient is much more awake today she's eating  Dressing was changed her wound is clean her calf is soft and supple or Homans sign is negative  Her potassium was 2.6  Potassium replacement  Physical therapy  Skilled nursing facility when available

## 2016-01-22 LAB — CBC
HCT: 26 % — ABNORMAL LOW (ref 36.0–46.0)
Hemoglobin: 8.5 g/dL — ABNORMAL LOW (ref 12.0–15.0)
MCH: 30.2 pg (ref 26.0–34.0)
MCHC: 32.7 g/dL (ref 30.0–36.0)
MCV: 92.5 fL (ref 78.0–100.0)
Platelets: 217 10*3/uL (ref 150–400)
RBC: 2.81 MIL/uL — ABNORMAL LOW (ref 3.87–5.11)
RDW: 13.1 % (ref 11.5–15.5)
WBC: 6.2 10*3/uL (ref 4.0–10.5)

## 2016-01-22 LAB — POTASSIUM: POTASSIUM: 3.1 mmol/L — AB (ref 3.5–5.1)

## 2016-01-22 MED ORDER — ASPIRIN 325 MG PO TBEC: 325.0000 mg | DELAYED_RELEASE_TABLET | Freq: Every day | ORAL | Status: DC

## 2016-01-22 MED ORDER — CLONAZEPAM 1 MG PO TABS: 1.0000 mg | ORAL_TABLET | Freq: Three times a day (TID) | ORAL | Status: DC

## 2016-01-22 MED ORDER — HYDROCODONE-ACETAMINOPHEN 7.5-325 MG PO TABS: 1.0000 | ORAL_TABLET | Freq: Four times a day (QID) | ORAL | Status: DC | PRN

## 2016-01-22 NOTE — Progress Notes (Signed)
The patient was placed on room air approximally at 1430.  The patients room air O2 was 92%.  The patient did not appear to be under any distress.  Notified Diannia RuderKara the Child psychotherapistsocial worker and the patients family of the plan of care.

## 2016-01-22 NOTE — Progress Notes (Signed)
Patient had 2 urinary incontinent episodes this shift w/heavy bed wetting noted requiring total bed linen change, bed bath and gown change each time.

## 2016-01-22 NOTE — Progress Notes (Signed)
Respiratory Care Note: The patient's SAT's were 83-84% on room air. I asked her to please let me put her oxygen back on. I explained why and expressed my great concern. The patient states "No, I will just wait for the doctor to come; I already have all of this stuff on my legs." Attempted to encourage her once more and she stated, "No." RN aware.

## 2016-01-22 NOTE — Progress Notes (Signed)
[facility].  FL2 and prescriptions were sent with the patients son who was transferring her.  Voiced to him to give the packet to the receiving nurse,  He verbalized understanding.  Patiwent was transferred off the floor via w/c with staff and family in stable condition.  I attempted to call report at [phone].  I was kept on hold and was not able to give a report.  I held for approx 10 minutes.

## 2016-01-22 NOTE — Evaluation (Signed)
Occupational Therapy Evaluation Patient Details Name: Bonnie Rush MRN: [id number] DOB: 04/05/1934 Today's Date: 01/22/2016    History of Present Illness Bonnie Rush is an 82yo white female [facility] sustaining [facility] fall at home, resultant in the inability to WB on the LLE. Upon arrivlal, pt found to have L patella fracture and underwent ORIF. At baesline pt performs household ambulation only, is non compliant with DME suggestions from family, and has fallen an estimated 10x in the last several months (per daughter). The patient has significant visual impairment, and is suspected by family to be neglectful of ADL when not supervised.  One of the patient's sons lives with her, but cannot provide 24/7 assistance and has some physical disabilities that limit caregiver capabilities.    Clinical Impression   Patient eating breakfast upon therapy arrival and [facility] after. Pt did well with VC for technique [facility] as well as recliner afterwards. Patient requires skilled OT services to increase functional performance during ADL tasks and functional transfers that will allow for a safe return home. Recommend D/C to SNF.     Follow Up Recommendations  SNF    Equipment Recommendations  None recommended by OT    Recommendations for Other Services       Precautions / Restrictions Precautions Precautions: Fall Required Braces or Orthoses: Knee Immobilizer - Left Knee Immobilizer - Left: On at all times Restrictions Weight Bearing Restrictions: No LLE Weight Bearing: Weight bearing as tolerated      Mobility Bed Mobility Overal bed mobility: Needs Assistance Bed Mobility: Supine to Sit     Supine to sit: Supervision;HOB elevated        Transfers Overall transfer level: Needs assistance   Transfers: Sit to/from Stand;Stand Pivot Transfers Sit to Stand: Min assist Stand pivot transfers: Min assist       General transfer comment: Pt did  well with VC for form and technique. Pt did place some weight on LLE during transfer.          ADL Overall ADL's : Needs assistance/impaired Eating/Feeding: Modified independent;Bed level                       Toilet Transfer: Minimal assistance;BSC;Cueing for safety;Cueing for sequencing   Toileting- Clothing Manipulation and Hygiene: Total assistance;Sit to/from stand                         Pertinent Vitals/Pain Pain Assessment: 0-10 Pain Score: 8  Pain Location: left knee Pain Descriptors / Indicators: Sore     Hand Dominance Right   Extremity/Trunk Assessment Upper Extremity Assessment Upper Extremity Assessment: Overall WFL for tasks assessed   Lower Extremity Assessment Lower Extremity Assessment: Defer to PT evaluation       Communication Communication Communication: No difficulties   Cognition Arousal/Alertness: Awake/alert Behavior During Therapy: WFL for tasks assessed/performed Overall Cognitive Status: History of cognitive impairments - at baseline       Memory: Decreased recall of precautions;Decreased short-term memory                        Home Living Family/patient expects to be discharged to:: Skilled nursing facility Living Arrangements: Children  Prior Functioning/Environment Level of Independence: Needs assistance  Gait / Transfers Assistance Needed: Multiple falls in home, HH amb only, does not use rollator that daughter bought for her.  ADL's / Homemaking Assistance Needed: some self neglect when not cued/supervised for bathing.         OT Diagnosis: Generalized weakness   OT Problem List: Decreased strength;Pain;Decreased knowledge of use of DME or AE;Impaired balance (sitting and/or standing)   OT Treatment/Interventions: Self-care/ADL training    OT Goals(Current goals can be found in the care plan section) Acute Rehab OT Goals Patient Stated Goal:  to go home. OT Goal Formulation: With patient Time For Goal Achievement: 02/05/16 Potential to Achieve Goals: Good  OT Frequency: Min 2X/week   Barriers to D/C: Decreased caregiver support             End of Session Equipment Utilized During Treatment: Gait belt  Activity Tolerance: Patient tolerated treatment well Patient left: in chair;with call bell/phone within reach   Time: 0830-0920 OT Time Calculation (min): 50 min Charges:  OT General Charges $OT Visit: 1 Procedure OT Evaluation $OT Eval Low Complexity: 1 Procedure OT Treatments $Self Care/Home Management : 8-22 mins (15 minutes) G-Codes:    Limmie Patricia, OTR/L,CBIS  [phone]  01/22/2016, 9:25 AM

## 2016-01-22 NOTE — Clinical Social Work Placement (Signed)
   CLINICAL SOCIAL WORK PLACEMENT  NOTE  Date:  01/22/2016  Patient Details  Name: Bonnie Rush MRN: [id number] Date of Birth: 1934-06-12  Clinical Social Work is seeking post-discharge placement for this patient at the Skilled  Nursing Facility level of care (*CSW will initial, date and re-position this form in  chart as items are completed):  Yes   Patient/family provided with [place] Clinical Social Work Department's list of facilities offering this level of care within the geographic area requested by the patient (or if unable, by the patient's family).  Yes   Patient/family informed of their freedom to choose among providers that offer the needed level of care, that participate in Medicare, Medicaid or managed care program needed by the patient, have an available bed and are willing to accept the patient.  Yes   Patient/family informed of [facility]'s ownership interest [facility] and [facility], as well as of the fact that they are under no obligation to receive care at these facilities.  PASRR submitted to EDS on 01/22/16     PASRR number received on 01/22/16     Existing PASRR number confirmed on       FL2 transmitted to all facilities in geographic area requested by pt/family on 01/22/16     FL2 transmitted to all facilities within larger geographic area on       Patient informed that his/her managed care company has contracts with or will negotiate with certain facilities, including the following:            Patient/family informed of bed offers received.  Patient chooses bed at       Physician recommends and patient chooses bed at      Patient to be transferred to   on  .  Patient to be transferred to facility by       Patient family notified on   of transfer.  Name of family member notified:        PHYSICIAN       Additional Comment:    _______________________________________________ Cheron SchaumannBandi, Layni Kreamer M, LCSW  [phone]  01/22/2016,  11:07 AM

## 2016-01-22 NOTE — [place] FL2 (Signed)
[place] MEDICAID FL2 LEVEL OF CARE SCREENING TOOL     IDENTIFICATION  Patient Name: Bonnie Rush Birthdate: August 06, 1934 Sex: female Admission Date (Current Location): 01/18/2016  [facility] and [place] Number:  [employer] and Address:  [facility],  [address] [place] [address]      Provider Number: [id number]  Attending Physician Name and Address:  Vickki Hearing, MD  Relative Name and Phone Number:       Current Level of Care: Hospital Recommended Level of Care: Skilled Nursing Facility Prior Approval Number:    Date Approved/Denied:   PASRR Number: [id number]  Discharge Plan: SNF    Current Diagnoses: Patient Active Problem List   Diagnosis Date Noted  . Left patella fracture 01/19/2016  . Closed fracture of left patella   . Patella fracture 01/18/2016    Orientation RESPIRATION BLADDER Height & Weight     Self, Time, Situation, Place  O2 Incontinent Weight: 149 lb (67.586 kg) Height:   (160 cm)  BEHAVIORAL SYMPTOMS/MOOD NEUROLOGICAL BOWEL NUTRITION STATUS      Incontinent Diet (normal)  AMBULATORY STATUS COMMUNICATION OF NEEDS Skin   Extensive Assist Verbally Normal                       Personal Care Assistance Level of Assistance  Bathing, Feeding, Dressing, Total care Bathing Assistance: Maximum assistance Feeding assistance: Independent Dressing Assistance: Maximum assistance Total Care Assistance: Maximum assistance   Functional Limitations Info  Sight, Hearing, Speech Sight Info: Adequate Hearing Info: Adequate Speech Info: Adequate    SPECIAL CARE FACTORS FREQUENCY                       Contractures      Additional Factors Info  Code Status, Allergies Code Status Info: Full Allergies Info: Penicillians           Current Medications (01/22/2016):  This is the current hospital active medication list Current Facility-Administered Medications  Medication Dose Route Frequency  Provider Last Rate Last Dose  . 0.9 %  sodium chloride infusion   Intravenous Continuous Vickki Hearing, MD 100 mL/hr at 01/21/16 2114    . acetaminophen (TYLENOL) tablet 650 mg  650 mg Oral Q6H PRN Vickki Hearing, MD       Or  . acetaminophen (TYLENOL) suppository 650 mg  650 mg Rectal Q6H PRN Vickki Hearing, MD      . aspirin EC tablet 325 mg  325 mg Oral Q breakfast Vickki Hearing, MD   325 mg at 01/21/16 0855  . atorvastatin (LIPITOR) tablet 20 mg  20 mg Oral q1800 Vickki Hearing, MD   20 mg at 01/20/16 1700  . clonazePAM (KLONOPIN) tablet 1 mg  1 mg Oral TID Vickki Hearing, MD   1 mg at 01/21/16 2051  . diphenoxylate-atropine (LOMOTIL) 2.5-0.025 MG per tablet 1 tablet  1 tablet Oral QID PRN Vickki Hearing, MD      . docusate sodium (COLACE) capsule 100 mg  100 mg Oral BID PRN Vickki Hearing, MD      . gabapentin (NEURONTIN) capsule 100 mg  100 mg Oral BID Vickki Hearing, MD   100 mg at 01/21/16 2050  . heparin injection 5,000 Units  5,000 Units Subcutaneous Q8H Vickki Hearing, MD   5,000 Units at 01/22/16 0454  . hydrochlorothiazide (HYDRODIURIL) tablet 25 mg  25 mg Oral Daily Fernande Boyden  Romeo AppleHarrison, MD   25 mg at 01/21/16 1059  . HYDROcodone-acetaminophen (NORCO/VICODIN) 5-325 MG per tablet 1-2 tablet  1-2 tablet Oral Q4H PRN Vickki HearingStanley E Harrison, MD   2 tablet at 01/20/16 2026  . latanoprost (XALATAN) 0.005 % ophthalmic solution 1 drop  1 drop Both Eyes QHS Vickki HearingStanley E Harrison, MD   1 drop at 01/21/16 2312  . loratadine (CLARITIN) tablet 10 mg  10 mg Oral Daily Vickki HearingStanley E Harrison, MD   10 mg at 01/21/16 1059  . meclizine (ANTIVERT) tablet 25 mg  25 mg Oral TID PRN Vickki HearingStanley E Harrison, MD      . menthol-cetylpyridinium (CEPACOL) lozenge 3 mg  1 lozenge Oral PRN Vickki HearingStanley E Harrison, MD       Or  . phenol (CHLORASEPTIC) mouth spray 1 spray  1 spray Mouth/Throat PRN Vickki HearingStanley E Harrison, MD      . metoCLOPramide (REGLAN) tablet 5-10 mg  5-10 mg Oral Q8H PRN Vickki HearingStanley  E Harrison, MD       Or  . metoCLOPramide (REGLAN) injection 5-10 mg  5-10 mg Intravenous Q8H PRN Vickki HearingStanley E Harrison, MD      . mometasone-formoterol [facility](DULERA) 200-5 MCG/ACT inhaler 2 puff  2 puff Inhalation BID Vickki HearingStanley E Harrison, MD   2 puff at 01/22/16 0818  . morphine 2 MG/ML injection 1 mg  1 mg Intravenous Q2H PRN Vickki HearingStanley E Harrison, MD   1 mg at 01/21/16 2051  . ondansetron (ZOFRAN) tablet 4 mg  4 mg Oral Q6H PRN Vickki HearingStanley E Harrison, MD       Or  . ondansetron [facility](ZOFRAN) injection 4 mg  4 mg Intravenous Q6H PRN Vickki HearingStanley E Harrison, MD   4 mg at 01/19/16 1722  . pantoprazole (PROTONIX) EC tablet 80 mg  80 mg Oral Daily Vickki HearingStanley E Harrison, MD   80 mg at 01/21/16 1059  . polyethylene glycol (MIRALAX / GLYCOLAX) packet 17 g  17 g Oral Daily PRN Vickki HearingStanley E Harrison, MD      . polyethylene glycol (MIRALAX / GLYCOLAX) packet 17 g  17 g Oral Daily Vickki HearingStanley E Harrison, MD   17 g at 01/21/16 1100  . potassium chloride SA (K-DUR,KLOR-CON) CR tablet 20 mEq  20 mEq Oral TID Vickki HearingStanley E Harrison, MD   20 mEq at 01/21/16 2051  . promethazine (PHENERGAN) tablet 25 mg  25 mg Oral Q6H PRN Vickki HearingStanley E Harrison, MD   25 mg at 01/20/16 2026  . propranolol (INDERAL) tablet 60 mg  60 mg Oral BID Vickki HearingStanley E Harrison, MD   60 mg at 01/21/16 2050  . senna (SENOKOT) tablet 8.6 mg  1 tablet Oral BID Vickki HearingStanley E Harrison, MD   8.6 mg at 01/21/16 2051  . timolol (TIMOPTIC) 0.5 % ophthalmic solution 1 drop  1 drop Right Eye Daily Vickki HearingStanley E Harrison, MD   1 drop at 01/21/16 1000  . venlafaxine XR (EFFEXOR-XR) 24 hr capsule 150 mg  150 mg Oral Q breakfast Vickki HearingStanley E Harrison, MD   150 mg at 01/21/16 [id number]     Discharge Medications: Please see discharge summary for a list of discharge medications.  Relevant Imaging Results:  Relevant Lab Results:   Additional Information SSN # [id number]  Karn CassisStultz, Kara Shanaberger, LCSW

## 2016-01-22 NOTE — Progress Notes (Signed)
LCSW sent initial CSW consult and therapy notes via the Hub [place].Patients daughter was informed of SNF and medicaid and that choices may be limited. Will await bed offers and present options to family in a while.  Rada Zegers LCSW

## 2016-01-22 NOTE — Clinical Social Work Placement (Addendum)
   CLINICAL SOCIAL WORK PLACEMENT  NOTE  Date:  01/22/2016  Patient Details  Name: Bonnie Rush MRN: [id number] Date of Birth: 1934/01/30  Clinical Social Work is seeking post-discharge placement for this patient at the Skilled  Nursing Facility level of care (*CSW will initial, date and re-position this form in  chart as items are completed):  Yes   Patient/family provided with [place] Clinical Social Work Department's list of facilities offering this level of care within the geographic area requested by the patient (or if unable, by the patient's family).  Yes   Patient/family informed of their freedom to choose among providers that offer the needed level of care, that participate in Medicare, Medicaid or managed care program needed by the patient, have an available bed and are willing to accept the patient.  Yes   Patient/family informed of [facility]'s ownership interest in [facility] and [facility], as well as of the fact that they are under no obligation to receive care at these facilities.  PASRR submitted to EDS on 01/22/16     PASRR number received on 01/22/16     Existing PASRR number confirmed on       FL2 transmitted to all facilities in geographic area requested by pt/family on 01/22/16     FL2 transmitted to all facilities within larger geographic area on       Patient informed that his/her managed care company has contracts with or will negotiate with certain facilities, including the following:        Yes   Patient/family informed of bed offers received.  [name]     Physician recommends and patient chooses bed at      [name] 01[facility].  Patient to be transferred to facility by family    Patient family notified on 01/22/16 of transfer.  Name of family member notified:  [name] ( daughter)     PHYSICIAN       Additional Comment:   [facility] ,this  was explained to family who are agreeable with plan. _______________________________________________ Cheron SchaumannBandi, Claudine M, LCSW 01/22/2016, 1:16 PM

## 2016-01-22 NOTE — Clinical Social Work Note (Signed)
**Note De-Identified Rush Obfuscation** Clinical Social Work Assessment  Patient Details  Name: Bonnie Rush MRN: [id number] Date of Birth: 12/21/1933  Date of referral:  01/22/16               Reason for consult:  Facility Placement, Discharge Planning                Permission sought to share information with:  Family Supports, Customer service manager Permission granted to share information::  Yes, Verbal Permission Granted  Name::     Bonnie Rush [phone] and her sons Sebastian Ache and Corporate treasurer::  yes  Relationship::  yes  Contact Information:  yes  Housing/Transportation Living arrangements for the past 2 months:  [place] of Information:  Patient Patient Interpreter Needed:  None Criminal Activity/Legal Involvement Pertinent to Current Situation/Hospitalization:  No - Comment as needed Significant Relationships:  Adult Children Lives with:  Adult Children, Self Do you feel safe going back to the place where you live?  Yes Need for family participation in patient care:  Yes (Comment)  Care giving concerns: Does not meet three day requirement/pt will return home and have PT Home health   Social Worker assessment / plan: LCSW met with patient 82 year old widowed female who is oriented x3. She came to hospital  After having a fall and injuring her left patella fracture. She lives with one of her son and reports she has IBS and walker and portable potty. She reports she is mostly independent and can take care of her self. She stated her son would be able to help as well. She does not drive anymore but her sons are able to transport and assist with her needs. Verbal consent given to LCSW to contact family and facilities.Discussed medicare/medicaid criteria and guidelines. Patient was hesitant to agree to snf due to financial issues however want [place] to contact her daughter. LCSW called daughter Bonnie Sciara and she would like her Mom to go to short term  rehab she will discuss with her brothers and call LCSW  .  Employment status:  Retired Forensic scientist:  Information systems manager, [employer] [place] PT Recommendations:  [place], [place] / Referral to community resources:  [place] (Discussed home health support)  Patient/Family's Response to care: Prefers short term rehab  Patient/Family's Understanding of and Emotional Response to Diagnosis, Current Treatment, and Prognosis:  Understands her Mom would benefit from short term rehab  Emotional Assessment Appearance:  Appears stated age Attitude/Demeanor/Rapport:   (Pleasant and cooperative) Affect (typically observed):  Accepting, Hopeful, Appropriate Orientation:  Oriented to Self, Oriented to Place, Oriented to  Time, Oriented to Situation Alcohol / Substance use:  Never Used Psych involvement (Current and /or in the community):  No (Comment)  Discharge Needs  Concerns to be addressed:  Care Coordination, Financial / Insurance Concerns Readmission within the last 30 days:  No Current discharge risk:  Dependent with Mobility Barriers to Discharge:  No Barriers Identified   Joana Reamer, LCSW 01/22/2016, 9:46 AM

## 2016-01-22 NOTE — Discharge Summary (Signed)
Physician Discharge Summary  Patient ID: PRAPTI GRUSSING MRN: [id number] DOB/AGE: 12-17-1933 82 y.o.  Admit date: 01/18/2016 Discharge date: 01/22/2016  Admission Diagnoses:Left patella fracture  Discharge Diagnoses: Left patella fracture Active Problems:   Patella fracture   Closed fracture of left patella   Left patella fracture   Discharged Condition: stable  Hospital Course: Patient was admitted from the office on April 27 with a left patella fracture Ability to walk. She had surgery on April 28 tension band wire construct left knee. That went well. She did not progress well in physical therapy over the weekend and after therapy assessment we decided to place her in skilled care to improve her rehabilitation and give her appropriate care during the convalescent PERIOD.   Consults: None  Discharge Exam: Blood pressure 153/72, pulse 70, temperature 99.3 F (37.4 C), temperature source Oral, resp. rate 18, height  (1.6 m), weight 149 lb (67.586 kg), SpO2 83 %. General appearance: alert and cooperative Neurologic: Mental status: Alert, oriented, thought content appropriate, alertness: alert, orientation: time, person, affect: flat Incision/Wound: Clean  Disposition: Skilled care  Discharge Instructions    Diet - low sodium heart healthy    Complete by:  As directed      Discharge instructions    Complete by:  As directed   Weightbearing as tolerated in knee immobilizer     Discharge wound care:    Complete by:  As directed   Change dressing daily as needed     Increase activity slowly    Complete by:  As directed             Medication List    STOP taking these medications        oxyCODONE-acetaminophen 5-325 MG tablet  Commonly known as:  PERCOCET/ROXICET      TAKE these medications        alendronate 70 MG tablet  Commonly known as:  FOSAMAX  Take 70 mg by mouth once a week. Take with a full glass of water on an empty stomach.Takes on Monday     aspirin  325 MG EC tablet  Take 1 tablet (325 mg total) by mouth daily with breakfast.     atorvastatin 20 MG tablet  Commonly known as:  LIPITOR  Take 20 mg by mouth daily.     CALCIUM 600 + D PO  Take 1 tablet by mouth daily.     clonazePAM 1 MG tablet  Commonly known as:  KLONOPIN  Take 1 tablet (1 mg total) by mouth 3 (three) times daily.     DEXILANT 60 MG capsule  Generic drug:  dexlansoprazole  Take 60 mg by mouth daily.     diphenoxylate-atropine 2.5-0.025 MG tablet  Commonly known as:  LOMOTIL  Take 1 tablet by mouth 4 (four) times daily as needed for diarrhea or loose stools.     docusate sodium 100 MG capsule  Commonly known as:  COLACE  Take 1 capsule (100 mg total) by mouth 2 (two) times daily as needed for mild constipation.     fexofenadine 180 MG tablet  Commonly known as:  ALLEGRA  Take 180 mg by mouth daily.     Fluticasone-Salmeterol 250-50 MCG/DOSE Aepb  Commonly known as:  ADVAIR  Inhale 1 puff into the lungs daily.     gabapentin 100 MG capsule  Commonly known as:  NEURONTIN  Take 100 mg by mouth 2 (two) times daily.     hydrochlorothiazide 25 MG tablet  Commonly known as:  HYDRODIURIL  Take 25 mg by mouth daily.     HYDROcodone-acetaminophen 7.5-325 MG tablet  Commonly known as:  NORCO  Take 1 tablet by mouth every 6 (six) hours as needed for moderate pain.     latanoprost 0.005 % ophthalmic solution  Commonly known as:  XALATAN  Place 1 drop into both eyes at bedtime.     meclizine 25 MG tablet  Commonly known as:  ANTIVERT  Take 25 mg by mouth 3 (three) times daily as needed for dizziness.     promethazine 25 MG tablet  Commonly known as:  PHENERGAN  Take 25 mg by mouth every 6 (six) hours as needed for nausea or vomiting.     propranolol 60 MG tablet  Commonly known as:  INDERAL  Take 60 mg by mouth 2 (two) times daily.     timolol 0.5 % ophthalmic solution  Commonly known as:  BETIMOL  Place 1 drop into the right eye daily.      venlafaxine XR 150 MG 24 hr capsule  Commonly known as:  EFFEXOR-XR  Take 150 mg by mouth daily with breakfast.           Follow-up Information    Follow up with Fuller CanadaStanley Harrison, MD On 02/02/2016.   Specialties:  Orthopedic Surgery, Radiology   Why:  For suture removal, x-ray left knee patella fracture [facility] lateral only   Contact information:   [address] [place] [address] [phone]       Signed: Fuller CanadaStanley Harrison 01/22/2016, 1:46 PM

## 2016-01-22 NOTE — Care Management Important Message (Signed)
Important Message  Patient Details  Name: Bonnie Rush MRN: [id number] Date of Birth: 1933/11/10   Medicare Important Message Given:  Yes    Adonis HugueninBerkhead, Leighton Brickley L, RN 01/22/2016, 2:03 PM

## 2016-01-24 DIAGNOSIS — S82002G Unspecified fracture of left patella, subsequent encounter for closed fracture with delayed healing: Secondary | ICD-10-CM | POA: Diagnosis not present

## 2016-01-24 DIAGNOSIS — R4189 Other symptoms and signs involving cognitive functions and awareness: Secondary | ICD-10-CM | POA: Diagnosis not present

## 2016-01-24 DIAGNOSIS — R279 Unspecified lack of coordination: Secondary | ICD-10-CM | POA: Diagnosis not present

## 2016-01-24 DIAGNOSIS — M6281 Muscle weakness (generalized): Secondary | ICD-10-CM | POA: Diagnosis not present

## 2016-01-24 DIAGNOSIS — R41841 Cognitive communication deficit: Secondary | ICD-10-CM | POA: Diagnosis not present

## 2016-01-25 DIAGNOSIS — R279 Unspecified lack of coordination: Secondary | ICD-10-CM | POA: Diagnosis not present

## 2016-01-25 DIAGNOSIS — S82002G Unspecified fracture of left patella, subsequent encounter for closed fracture with delayed healing: Secondary | ICD-10-CM | POA: Diagnosis not present

## 2016-01-25 DIAGNOSIS — R41841 Cognitive communication deficit: Secondary | ICD-10-CM | POA: Diagnosis not present

## 2016-01-25 DIAGNOSIS — M6281 Muscle weakness (generalized): Secondary | ICD-10-CM | POA: Diagnosis not present

## 2016-01-25 DIAGNOSIS — R4189 Other symptoms and signs involving cognitive functions and awareness: Secondary | ICD-10-CM | POA: Diagnosis not present

## 2016-01-26 DIAGNOSIS — S82002G Unspecified fracture of left patella, subsequent encounter for closed fracture with delayed healing: Secondary | ICD-10-CM | POA: Diagnosis not present

## 2016-01-26 DIAGNOSIS — R279 Unspecified lack of coordination: Secondary | ICD-10-CM | POA: Diagnosis not present

## 2016-01-26 DIAGNOSIS — M6281 Muscle weakness (generalized): Secondary | ICD-10-CM | POA: Diagnosis not present

## 2016-01-26 DIAGNOSIS — R41841 Cognitive communication deficit: Secondary | ICD-10-CM | POA: Diagnosis not present

## 2016-01-26 DIAGNOSIS — R4189 Other symptoms and signs involving cognitive functions and awareness: Secondary | ICD-10-CM | POA: Diagnosis not present

## 2016-01-27 DIAGNOSIS — S82001A Unspecified fracture of right patella, initial encounter for closed fracture: Secondary | ICD-10-CM | POA: Diagnosis not present

## 2016-01-27 DIAGNOSIS — I10 Essential (primary) hypertension: Secondary | ICD-10-CM | POA: Diagnosis not present

## 2016-01-27 DIAGNOSIS — W010XXA Fall on same level from slipping, tripping and stumbling without subsequent striking against object, initial encounter: Secondary | ICD-10-CM | POA: Diagnosis not present

## 2016-01-29 ENCOUNTER — Ambulatory Visit: Payer: Medicare Other | Admitting: Orthopedic Surgery

## 2016-01-29 DIAGNOSIS — R279 Unspecified lack of coordination: Secondary | ICD-10-CM | POA: Diagnosis not present

## 2016-01-29 DIAGNOSIS — S82002G Unspecified fracture of left patella, subsequent encounter for closed fracture with delayed healing: Secondary | ICD-10-CM | POA: Diagnosis not present

## 2016-01-29 DIAGNOSIS — R41841 Cognitive communication deficit: Secondary | ICD-10-CM | POA: Diagnosis not present

## 2016-01-29 DIAGNOSIS — R4189 Other symptoms and signs involving cognitive functions and awareness: Secondary | ICD-10-CM | POA: Diagnosis not present

## 2016-01-29 DIAGNOSIS — M6281 Muscle weakness (generalized): Secondary | ICD-10-CM | POA: Diagnosis not present

## 2016-01-29 NOTE — ED Provider Notes (Signed)
CSN: [id number]     Arrival date & time 01/17/16  1637 History   First MD Initiated Contact with Patient 01/17/16 1652     Chief Complaint  Patient presents with  . Knee Pain     (Consider location/radiation/quality/duration/timing/severity/associated sxs/prior Treatment) HPI   82 year old female with left knee pain. Patient had a mechanical fall when she slipped and fell in her kitchen. She fell down onto her left knee. She has been having significant pain in her left knee to the point of being unable to ambulate. She thinks she did hit her head. No loss of consciousness. Denies any significant headache or neck pain. Family said bedside. They report she is at her baseline mental status.  Past Medical History  Diagnosis Date  . Osteoporosis   . Hypertension   . High cholesterol   . Irritable bowel syndrome   . Palpitations   . Glaucoma    Past Surgical History  Procedure Laterality Date  . Eye surgery    . Breast surgery    . Abdominal hysterectomy    . Cholecystectomy    . Orif patella Left 01/19/2016    Procedure: OPEN REDUCTION INTERNAL (ORIF) FIXATION LEFT PATELLA  FRACTURE;  Surgeon: Vickki Hearing, MD;  Location: [facility];  Service: Orthopedics;  Laterality: Left;   No family history on file. Social History  Substance Use Topics  . Smoking status: Never Smoker   . Smokeless tobacco: Not on file  . Alcohol Use: No   OB History    No data available     Review of Systems  All systems reviewed and negative, other than as noted in HPI.   Allergies  Penicillins  Home Medications   Prior to Admission medications   Medication Sig Start Date End Date Taking? Authorizing Provider  alendronate (FOSAMAX) 70 MG tablet Take 70 mg by mouth once a week. Take with a full glass of water on an empty stomach.Takes on Monday   Yes Historical Provider, MD  atorvastatin (LIPITOR) 20 MG tablet Take 20 mg by mouth daily.   Yes Historical Provider, MD  Calcium  Carb-Cholecalciferol (CALCIUM 600 + D PO) Take 1 tablet by mouth daily.   Yes Historical Provider, MD  dexlansoprazole (DEXILANT) 60 MG capsule Take 60 mg by mouth daily.   Yes Historical Provider, MD  diphenoxylate-atropine (LOMOTIL) 2.5-0.025 MG tablet Take 1 tablet by mouth 4 (four) times daily as needed for diarrhea or loose stools.   Yes Historical Provider, MD  fexofenadine (ALLEGRA) 180 MG tablet Take 180 mg by mouth daily.   Yes Historical Provider, MD  Fluticasone-Salmeterol (ADVAIR) 250-50 MCG/DOSE AEPB Inhale 1 puff into the lungs daily.   Yes Historical Provider, MD  gabapentin (NEURONTIN) 100 MG capsule Take 100 mg by mouth 2 (two) times daily.   Yes Historical Provider, MD  hydrochlorothiazide (HYDRODIURIL) 25 MG tablet Take 25 mg by mouth daily.   Yes Historical Provider, MD  latanoprost (XALATAN) 0.005 % ophthalmic solution Place 1 drop into both eyes at bedtime.   Yes Historical Provider, MD  meclizine (ANTIVERT) 25 MG tablet Take 25 mg by mouth 3 (three) times daily as needed for dizziness.   Yes Historical Provider, MD  promethazine (PHENERGAN) 25 MG tablet Take 25 mg by mouth every 6 (six) hours as needed for nausea or vomiting.   Yes Historical Provider, MD  propranolol (INDERAL) 60 MG tablet Take 60 mg by mouth 2 (two) times daily.   Yes Historical Provider, MD  timolol (  BETIMOL) 0.5 % ophthalmic solution Place 1 drop into the right eye daily.   Yes Historical Provider, MD  venlafaxine XR (EFFEXOR-XR) 150 MG 24 hr capsule Take 150 mg by mouth daily with breakfast.   Yes Historical Provider, MD  aspirin EC 325 MG EC tablet Take 1 tablet (325 mg total) by mouth daily with breakfast. 01/22/16   Vickki Hearing, MD  clonazePAM (KLONOPIN) 1 MG tablet Take 1 tablet (1 mg total) by mouth 3 (three) times daily. 01/22/16   Vickki Hearing, MD  docusate sodium (COLACE) 100 MG capsule Take 1 capsule (100 mg total) by mouth 2 (two) times daily as needed for mild constipation. 01/17/16    Raeford Razor, MD  HYDROcodone-acetaminophen (NORCO) 7.5-325 MG tablet Take 1 tablet by mouth every 6 (six) hours as needed for moderate pain. 01/22/16   Vickki Hearing, MD   BP 118/78 mmHg  Pulse 74  Temp(Src) 98 F (36.7 C) (Oral)  Resp 16  Ht  (1.6 m)  Wt 147 lb (66.679 kg)  BMI 26.05 kg/m2  SpO2 93% Physical Exam  Constitutional: She appears well-developed and well-nourished. No distress.  HENT:  Head: Normocephalic and atraumatic.  Eyes: Conjunctivae are normal. Right eye exhibits no discharge. Left eye exhibits no discharge.  Neck: Neck supple.  Cardiovascular: Normal rate, regular rhythm and normal heart sounds.  Exam reveals no gallop and no friction rub.   No murmur heard. Pulmonary/Chest: Effort normal and breath sounds normal. No respiratory distress.  Abdominal: Soft. She exhibits no distension. There is no tenderness.  Musculoskeletal:  Significant swelling in bilateral ecchymosis left knee. Patella is diffusely tender to palpation. No crepitus. Patient is unable to actively extend her knee secondary to pain. Closed injury. NVI  Neurological: She is alert.  Skin: Skin is warm and dry.  Psychiatric: She has a normal mood and affect. Her behavior is normal. Thought content normal.  Nursing note and vitals reviewed.   ED Course  Procedures (including critical care time) Labs Review Labs Reviewed - No data to display  Imaging Review No results found.   Portable Chest 1 View  01/18/2016  CLINICAL DATA:  Preop evaluation. Patient fell yesterday injuring the left knee. History of hypertension. EXAM: PORTABLE CHEST 1 VIEW COMPARISON:  None. FINDINGS: Cardiac silhouette is mildly enlarged. No mediastinal or hilar masses or evidence of adenopathy. Lungs are clear.  No pleural effusion pneumothorax. Bony thorax is demineralized but grossly intact. IMPRESSION: No acute cardiopulmonary disease.  Mild cardiomegaly. Electronically Signed   By: Amie Portland M.D.   On:  01/18/2016 18:35   Dg Knee Complete 4 Views Left  01/17/2016  CLINICAL DATA:  Pt states she fell from a standing position while in her kitchen severe pain, swelling, and bruising in anterior aspect of Lt knee since fall. EXAM: LEFT KNEE - COMPLETE 4+ VIEW COMPARISON:  None. FINDINGS: Moderate arthritis. There is a horizontal fracture through the center of the patella with slight displacement of superior and inferior fracture fragments insignificant overlying soft tissue swelling. There is anticipated joint effusion. IMPRESSION: Patellar fracture Electronically Signed   By: Esperanza Heir M.D.   On: 01/17/2016 17:12   I have personally reviewed and evaluated these images and lab results as part of my medical decision-making.   EKG Interpretation None      MDM   Final diagnoses:  Patellar fracture, left, closed, initial encounter   82 year old female with knee pain after mechanical fall. Imaging significant for patellar fracture.  Closed injury. Neurovascular intact. Orthopedic follow-up for likely surgical repair.    Raeford RazorStephen Gary Bultman, MD 01/29/16 [phone]

## 2016-01-30 DIAGNOSIS — R4189 Other symptoms and signs involving cognitive functions and awareness: Secondary | ICD-10-CM | POA: Diagnosis not present

## 2016-01-30 DIAGNOSIS — S82002G Unspecified fracture of left patella, subsequent encounter for closed fracture with delayed healing: Secondary | ICD-10-CM | POA: Diagnosis not present

## 2016-01-30 DIAGNOSIS — R41841 Cognitive communication deficit: Secondary | ICD-10-CM | POA: Diagnosis not present

## 2016-01-30 DIAGNOSIS — M6281 Muscle weakness (generalized): Secondary | ICD-10-CM | POA: Diagnosis not present

## 2016-01-30 DIAGNOSIS — R279 Unspecified lack of coordination: Secondary | ICD-10-CM | POA: Diagnosis not present

## 2016-01-31 DIAGNOSIS — E785 Hyperlipidemia, unspecified: Secondary | ICD-10-CM | POA: Diagnosis not present

## 2016-01-31 DIAGNOSIS — S82002G Unspecified fracture of left patella, subsequent encounter for closed fracture with delayed healing: Secondary | ICD-10-CM | POA: Diagnosis not present

## 2016-01-31 DIAGNOSIS — R279 Unspecified lack of coordination: Secondary | ICD-10-CM | POA: Diagnosis not present

## 2016-01-31 DIAGNOSIS — R41841 Cognitive communication deficit: Secondary | ICD-10-CM | POA: Diagnosis not present

## 2016-01-31 DIAGNOSIS — M6281 Muscle weakness (generalized): Secondary | ICD-10-CM | POA: Diagnosis not present

## 2016-01-31 DIAGNOSIS — R4189 Other symptoms and signs involving cognitive functions and awareness: Secondary | ICD-10-CM | POA: Diagnosis not present

## 2016-01-31 DIAGNOSIS — I10 Essential (primary) hypertension: Secondary | ICD-10-CM | POA: Diagnosis not present

## 2016-02-01 DIAGNOSIS — S82002G Unspecified fracture of left patella, subsequent encounter for closed fracture with delayed healing: Secondary | ICD-10-CM | POA: Diagnosis not present

## 2016-02-01 DIAGNOSIS — R279 Unspecified lack of coordination: Secondary | ICD-10-CM | POA: Diagnosis not present

## 2016-02-01 DIAGNOSIS — R41841 Cognitive communication deficit: Secondary | ICD-10-CM | POA: Diagnosis not present

## 2016-02-01 DIAGNOSIS — M6281 Muscle weakness (generalized): Secondary | ICD-10-CM | POA: Diagnosis not present

## 2016-02-01 DIAGNOSIS — R4189 Other symptoms and signs involving cognitive functions and awareness: Secondary | ICD-10-CM | POA: Diagnosis not present

## 2016-02-02 ENCOUNTER — Ambulatory Visit (INDEPENDENT_AMBULATORY_CARE_PROVIDER_SITE_OTHER): Payer: Medicare Other

## 2016-02-02 ENCOUNTER — Ambulatory Visit (INDEPENDENT_AMBULATORY_CARE_PROVIDER_SITE_OTHER): Payer: Medicare Other | Admitting: Orthopedic Surgery

## 2016-02-02 VITALS — BP 122/80 | HR 78 | Ht 63.0 in | Wt 147.0 lb

## 2016-02-02 DIAGNOSIS — R41841 Cognitive communication deficit: Secondary | ICD-10-CM | POA: Diagnosis not present

## 2016-02-02 DIAGNOSIS — R4189 Other symptoms and signs involving cognitive functions and awareness: Secondary | ICD-10-CM | POA: Diagnosis not present

## 2016-02-02 DIAGNOSIS — R279 Unspecified lack of coordination: Secondary | ICD-10-CM | POA: Diagnosis not present

## 2016-02-02 DIAGNOSIS — S82002D Unspecified fracture of left patella, subsequent encounter for closed fracture with routine healing: Secondary | ICD-10-CM | POA: Diagnosis not present

## 2016-02-02 DIAGNOSIS — M6281 Muscle weakness (generalized): Secondary | ICD-10-CM | POA: Diagnosis not present

## 2016-02-02 DIAGNOSIS — S82002G Unspecified fracture of left patella, subsequent encounter for closed fracture with delayed healing: Secondary | ICD-10-CM | POA: Diagnosis not present

## 2016-02-02 NOTE — Patient Instructions (Signed)
Weightbearing as tolerated in brace brace 0-30 follow-up in 2 weeks repeat x-ray

## 2016-02-02 NOTE — Progress Notes (Signed)
Chief Complaint  Patient presents with  . Routine Post Op    post op 1 , Left knee DOS 01/18/16    Left knee tension band wire fixation transverse patella fracture  Suture removal. Wound is clean. Skin ecchymosis noted in the subcutaneous tissues. Patient's active extension 32 TO 5  Patient placed in hinged knee brace 0-30, full weightbearing.  X-ray WE NOTE SOME LOSS OF REDUCTION BUT EXTENSOR MECHANISM IS INTACT, #5 Ethibond suture cerclage also holding fracture  Recommend x-ray in 2 weeks check again if maintenance of position then we can check functional status of extensor mechanism is intact no changes needed.

## 2016-02-03 DIAGNOSIS — R41841 Cognitive communication deficit: Secondary | ICD-10-CM | POA: Diagnosis not present

## 2016-02-03 DIAGNOSIS — R279 Unspecified lack of coordination: Secondary | ICD-10-CM | POA: Diagnosis not present

## 2016-02-03 DIAGNOSIS — R4189 Other symptoms and signs involving cognitive functions and awareness: Secondary | ICD-10-CM | POA: Diagnosis not present

## 2016-02-03 DIAGNOSIS — M6281 Muscle weakness (generalized): Secondary | ICD-10-CM | POA: Diagnosis not present

## 2016-02-03 DIAGNOSIS — S82002G Unspecified fracture of left patella, subsequent encounter for closed fracture with delayed healing: Secondary | ICD-10-CM | POA: Diagnosis not present

## 2016-02-05 DIAGNOSIS — R279 Unspecified lack of coordination: Secondary | ICD-10-CM | POA: Diagnosis not present

## 2016-02-05 DIAGNOSIS — R41841 Cognitive communication deficit: Secondary | ICD-10-CM | POA: Diagnosis not present

## 2016-02-05 DIAGNOSIS — M6281 Muscle weakness (generalized): Secondary | ICD-10-CM | POA: Diagnosis not present

## 2016-02-05 DIAGNOSIS — R4189 Other symptoms and signs involving cognitive functions and awareness: Secondary | ICD-10-CM | POA: Diagnosis not present

## 2016-02-05 DIAGNOSIS — S82002G Unspecified fracture of left patella, subsequent encounter for closed fracture with delayed healing: Secondary | ICD-10-CM | POA: Diagnosis not present

## 2016-02-06 DIAGNOSIS — R41841 Cognitive communication deficit: Secondary | ICD-10-CM | POA: Diagnosis not present

## 2016-02-06 DIAGNOSIS — R279 Unspecified lack of coordination: Secondary | ICD-10-CM | POA: Diagnosis not present

## 2016-02-06 DIAGNOSIS — S82002G Unspecified fracture of left patella, subsequent encounter for closed fracture with delayed healing: Secondary | ICD-10-CM | POA: Diagnosis not present

## 2016-02-06 DIAGNOSIS — M6281 Muscle weakness (generalized): Secondary | ICD-10-CM | POA: Diagnosis not present

## 2016-02-06 DIAGNOSIS — R4189 Other symptoms and signs involving cognitive functions and awareness: Secondary | ICD-10-CM | POA: Diagnosis not present

## 2016-02-07 DIAGNOSIS — R4189 Other symptoms and signs involving cognitive functions and awareness: Secondary | ICD-10-CM | POA: Diagnosis not present

## 2016-02-07 DIAGNOSIS — R41841 Cognitive communication deficit: Secondary | ICD-10-CM | POA: Diagnosis not present

## 2016-02-07 DIAGNOSIS — M6281 Muscle weakness (generalized): Secondary | ICD-10-CM | POA: Diagnosis not present

## 2016-02-07 DIAGNOSIS — S82002G Unspecified fracture of left patella, subsequent encounter for closed fracture with delayed healing: Secondary | ICD-10-CM | POA: Diagnosis not present

## 2016-02-07 DIAGNOSIS — R279 Unspecified lack of coordination: Secondary | ICD-10-CM | POA: Diagnosis not present

## 2016-02-08 DIAGNOSIS — R41841 Cognitive communication deficit: Secondary | ICD-10-CM | POA: Diagnosis not present

## 2016-02-08 DIAGNOSIS — R4189 Other symptoms and signs involving cognitive functions and awareness: Secondary | ICD-10-CM | POA: Diagnosis not present

## 2016-02-08 DIAGNOSIS — R279 Unspecified lack of coordination: Secondary | ICD-10-CM | POA: Diagnosis not present

## 2016-02-08 DIAGNOSIS — S82002G Unspecified fracture of left patella, subsequent encounter for closed fracture with delayed healing: Secondary | ICD-10-CM | POA: Diagnosis not present

## 2016-02-08 DIAGNOSIS — M6281 Muscle weakness (generalized): Secondary | ICD-10-CM | POA: Diagnosis not present

## 2016-02-09 DIAGNOSIS — R41841 Cognitive communication deficit: Secondary | ICD-10-CM | POA: Diagnosis not present

## 2016-02-09 DIAGNOSIS — M6281 Muscle weakness (generalized): Secondary | ICD-10-CM | POA: Diagnosis not present

## 2016-02-09 DIAGNOSIS — R4189 Other symptoms and signs involving cognitive functions and awareness: Secondary | ICD-10-CM | POA: Diagnosis not present

## 2016-02-09 DIAGNOSIS — R279 Unspecified lack of coordination: Secondary | ICD-10-CM | POA: Diagnosis not present

## 2016-02-09 DIAGNOSIS — S82002G Unspecified fracture of left patella, subsequent encounter for closed fracture with delayed healing: Secondary | ICD-10-CM | POA: Diagnosis not present

## 2016-02-10 DIAGNOSIS — M6281 Muscle weakness (generalized): Secondary | ICD-10-CM | POA: Diagnosis not present

## 2016-02-10 DIAGNOSIS — S82002G Unspecified fracture of left patella, subsequent encounter for closed fracture with delayed healing: Secondary | ICD-10-CM | POA: Diagnosis not present

## 2016-02-10 DIAGNOSIS — R41841 Cognitive communication deficit: Secondary | ICD-10-CM | POA: Diagnosis not present

## 2016-02-10 DIAGNOSIS — R4189 Other symptoms and signs involving cognitive functions and awareness: Secondary | ICD-10-CM | POA: Diagnosis not present

## 2016-02-10 DIAGNOSIS — R279 Unspecified lack of coordination: Secondary | ICD-10-CM | POA: Diagnosis not present

## 2016-02-11 DIAGNOSIS — R279 Unspecified lack of coordination: Secondary | ICD-10-CM | POA: Diagnosis not present

## 2016-02-11 DIAGNOSIS — R41841 Cognitive communication deficit: Secondary | ICD-10-CM | POA: Diagnosis not present

## 2016-02-11 DIAGNOSIS — M6281 Muscle weakness (generalized): Secondary | ICD-10-CM | POA: Diagnosis not present

## 2016-02-11 DIAGNOSIS — S82002G Unspecified fracture of left patella, subsequent encounter for closed fracture with delayed healing: Secondary | ICD-10-CM | POA: Diagnosis not present

## 2016-02-11 DIAGNOSIS — R4189 Other symptoms and signs involving cognitive functions and awareness: Secondary | ICD-10-CM | POA: Diagnosis not present

## 2016-02-12 DIAGNOSIS — R279 Unspecified lack of coordination: Secondary | ICD-10-CM | POA: Diagnosis not present

## 2016-02-12 DIAGNOSIS — S82002G Unspecified fracture of left patella, subsequent encounter for closed fracture with delayed healing: Secondary | ICD-10-CM | POA: Diagnosis not present

## 2016-02-12 DIAGNOSIS — R41841 Cognitive communication deficit: Secondary | ICD-10-CM | POA: Diagnosis not present

## 2016-02-12 DIAGNOSIS — M6281 Muscle weakness (generalized): Secondary | ICD-10-CM | POA: Diagnosis not present

## 2016-02-12 DIAGNOSIS — R4189 Other symptoms and signs involving cognitive functions and awareness: Secondary | ICD-10-CM | POA: Diagnosis not present

## 2016-02-13 DIAGNOSIS — M6281 Muscle weakness (generalized): Secondary | ICD-10-CM | POA: Diagnosis not present

## 2016-02-13 DIAGNOSIS — R279 Unspecified lack of coordination: Secondary | ICD-10-CM | POA: Diagnosis not present

## 2016-02-13 DIAGNOSIS — R41841 Cognitive communication deficit: Secondary | ICD-10-CM | POA: Diagnosis not present

## 2016-02-13 DIAGNOSIS — R4189 Other symptoms and signs involving cognitive functions and awareness: Secondary | ICD-10-CM | POA: Diagnosis not present

## 2016-02-13 DIAGNOSIS — N390 Urinary tract infection, site not specified: Secondary | ICD-10-CM | POA: Diagnosis not present

## 2016-02-13 DIAGNOSIS — S82002G Unspecified fracture of left patella, subsequent encounter for closed fracture with delayed healing: Secondary | ICD-10-CM | POA: Diagnosis not present

## 2016-02-13 DIAGNOSIS — R309 Painful micturition, unspecified: Secondary | ICD-10-CM | POA: Diagnosis not present

## 2016-02-14 DIAGNOSIS — R4189 Other symptoms and signs involving cognitive functions and awareness: Secondary | ICD-10-CM | POA: Diagnosis not present

## 2016-02-14 DIAGNOSIS — R279 Unspecified lack of coordination: Secondary | ICD-10-CM | POA: Diagnosis not present

## 2016-02-14 DIAGNOSIS — S82002G Unspecified fracture of left patella, subsequent encounter for closed fracture with delayed healing: Secondary | ICD-10-CM | POA: Diagnosis not present

## 2016-02-14 DIAGNOSIS — M6281 Muscle weakness (generalized): Secondary | ICD-10-CM | POA: Diagnosis not present

## 2016-02-14 DIAGNOSIS — R41841 Cognitive communication deficit: Secondary | ICD-10-CM | POA: Diagnosis not present

## 2016-02-15 ENCOUNTER — Ambulatory Visit: Payer: Medicare Other | Admitting: Orthopedic Surgery

## 2016-02-15 DIAGNOSIS — R4189 Other symptoms and signs involving cognitive functions and awareness: Secondary | ICD-10-CM | POA: Diagnosis not present

## 2016-02-15 DIAGNOSIS — R279 Unspecified lack of coordination: Secondary | ICD-10-CM | POA: Diagnosis not present

## 2016-02-15 DIAGNOSIS — M6281 Muscle weakness (generalized): Secondary | ICD-10-CM | POA: Diagnosis not present

## 2016-02-15 DIAGNOSIS — S82002G Unspecified fracture of left patella, subsequent encounter for closed fracture with delayed healing: Secondary | ICD-10-CM | POA: Diagnosis not present

## 2016-02-15 DIAGNOSIS — R41841 Cognitive communication deficit: Secondary | ICD-10-CM | POA: Diagnosis not present

## 2016-02-16 DIAGNOSIS — R279 Unspecified lack of coordination: Secondary | ICD-10-CM | POA: Diagnosis not present

## 2016-02-16 DIAGNOSIS — R41841 Cognitive communication deficit: Secondary | ICD-10-CM | POA: Diagnosis not present

## 2016-02-16 DIAGNOSIS — S82002G Unspecified fracture of left patella, subsequent encounter for closed fracture with delayed healing: Secondary | ICD-10-CM | POA: Diagnosis not present

## 2016-02-16 DIAGNOSIS — M6281 Muscle weakness (generalized): Secondary | ICD-10-CM | POA: Diagnosis not present

## 2016-02-16 DIAGNOSIS — R4189 Other symptoms and signs involving cognitive functions and awareness: Secondary | ICD-10-CM | POA: Diagnosis not present

## 2016-02-17 DIAGNOSIS — M6281 Muscle weakness (generalized): Secondary | ICD-10-CM | POA: Diagnosis not present

## 2016-02-17 DIAGNOSIS — R4189 Other symptoms and signs involving cognitive functions and awareness: Secondary | ICD-10-CM | POA: Diagnosis not present

## 2016-02-17 DIAGNOSIS — R41841 Cognitive communication deficit: Secondary | ICD-10-CM | POA: Diagnosis not present

## 2016-02-17 DIAGNOSIS — R279 Unspecified lack of coordination: Secondary | ICD-10-CM | POA: Diagnosis not present

## 2016-02-17 DIAGNOSIS — S82002G Unspecified fracture of left patella, subsequent encounter for closed fracture with delayed healing: Secondary | ICD-10-CM | POA: Diagnosis not present

## 2016-02-19 DIAGNOSIS — R4189 Other symptoms and signs involving cognitive functions and awareness: Secondary | ICD-10-CM | POA: Diagnosis not present

## 2016-02-19 DIAGNOSIS — R41841 Cognitive communication deficit: Secondary | ICD-10-CM | POA: Diagnosis not present

## 2016-02-19 DIAGNOSIS — M6281 Muscle weakness (generalized): Secondary | ICD-10-CM | POA: Diagnosis not present

## 2016-02-19 DIAGNOSIS — S82002G Unspecified fracture of left patella, subsequent encounter for closed fracture with delayed healing: Secondary | ICD-10-CM | POA: Diagnosis not present

## 2016-02-19 DIAGNOSIS — R279 Unspecified lack of coordination: Secondary | ICD-10-CM | POA: Diagnosis not present

## 2016-02-20 DIAGNOSIS — R4189 Other symptoms and signs involving cognitive functions and awareness: Secondary | ICD-10-CM | POA: Diagnosis not present

## 2016-02-20 DIAGNOSIS — M6281 Muscle weakness (generalized): Secondary | ICD-10-CM | POA: Diagnosis not present

## 2016-02-20 DIAGNOSIS — R279 Unspecified lack of coordination: Secondary | ICD-10-CM | POA: Diagnosis not present

## 2016-02-20 DIAGNOSIS — S82002G Unspecified fracture of left patella, subsequent encounter for closed fracture with delayed healing: Secondary | ICD-10-CM | POA: Diagnosis not present

## 2016-02-20 DIAGNOSIS — R41841 Cognitive communication deficit: Secondary | ICD-10-CM | POA: Diagnosis not present

## 2016-02-21 ENCOUNTER — Ambulatory Visit: Payer: Medicare Other | Admitting: Orthopedic Surgery

## 2016-02-21 ENCOUNTER — Ambulatory Visit (INDEPENDENT_AMBULATORY_CARE_PROVIDER_SITE_OTHER): Payer: Medicare Other

## 2016-02-21 VITALS — BP 127/65 | HR 74 | Ht 63.0 in | Wt 147.0 lb

## 2016-02-21 DIAGNOSIS — R4189 Other symptoms and signs involving cognitive functions and awareness: Secondary | ICD-10-CM | POA: Diagnosis not present

## 2016-02-21 DIAGNOSIS — R41841 Cognitive communication deficit: Secondary | ICD-10-CM | POA: Diagnosis not present

## 2016-02-21 DIAGNOSIS — R279 Unspecified lack of coordination: Secondary | ICD-10-CM | POA: Diagnosis not present

## 2016-02-21 DIAGNOSIS — M6281 Muscle weakness (generalized): Secondary | ICD-10-CM | POA: Diagnosis not present

## 2016-02-21 DIAGNOSIS — S82002D Unspecified fracture of left patella, subsequent encounter for closed fracture with routine healing: Secondary | ICD-10-CM | POA: Diagnosis not present

## 2016-02-21 DIAGNOSIS — S82002G Unspecified fracture of left patella, subsequent encounter for closed fracture with delayed healing: Secondary | ICD-10-CM | POA: Diagnosis not present

## 2016-02-23 NOTE — Progress Notes (Signed)
Chief Complaint  Patient presents with  . Follow-up    Left fractured patella DOS 01/19/16    BP 127/65 mmHg  Pulse 74  Ht 5\' 3"  (1.6 m)  Wt 147 lb (66.679 kg)  BMI 26.05 kg/m2  She is doing well she can extend her knee from a flexed position her wound is healing pretty well she has a little scab and a bout the distal portion.  She has no tenderness or gapping at the fracture site  X-ray shows that the fracture is still intact the hardware still intact she does have some dorsal tilt of the fracture site which persisted since the original x-ray but there is no displacement and again active motion is intact  Patient will advance brace up to 90  Start physical therapy  Return for x-ray in 6 weeks patient is weightbearing as tolerated

## 2016-02-26 ENCOUNTER — Telehealth: Payer: Self-pay | Admitting: Orthopedic Surgery

## 2016-02-26 DIAGNOSIS — F329 Major depressive disorder, single episode, unspecified: Secondary | ICD-10-CM | POA: Diagnosis not present

## 2016-02-26 DIAGNOSIS — M6281 Muscle weakness (generalized): Secondary | ICD-10-CM | POA: Diagnosis not present

## 2016-02-26 DIAGNOSIS — S82002D Unspecified fracture of left patella, subsequent encounter for closed fracture with routine healing: Secondary | ICD-10-CM | POA: Diagnosis not present

## 2016-02-26 DIAGNOSIS — Z9181 History of falling: Secondary | ICD-10-CM | POA: Diagnosis not present

## 2016-02-26 DIAGNOSIS — I10 Essential (primary) hypertension: Secondary | ICD-10-CM | POA: Diagnosis not present

## 2016-02-26 NOTE — Telephone Encounter (Signed)
WBAT  BRACE 0-90  AAROM AS TOLERATED QUAD SETS, straight leg raises, terminal knee extensions

## 2016-02-26 NOTE — Telephone Encounter (Signed)
ROUTING TO DR HARRISON TO ADVISE 

## 2016-02-26 NOTE — Telephone Encounter (Signed)
CALLED THERAPIST, BUSY SIGNAL ONLY, WILL TRY TO CALL ORDERS TOMORROW

## 2016-02-26 NOTE — Telephone Encounter (Signed)
Pete, physical therapist for [facility], called to relay that he evaluated patient today, for left patella; states needs instructions, specific orders, regarding patient's brace which he states is locked at 85 degrees.  Or fax protocol to [facility]. His direct ph# is [phone] if you prefer to call in orders.

## 2016-02-27 NOTE — Telephone Encounter (Signed)
VERBAL ORDERS GIVEN TO THERAPIST PETE

## 2016-02-29 DIAGNOSIS — I10 Essential (primary) hypertension: Secondary | ICD-10-CM | POA: Diagnosis not present

## 2016-02-29 DIAGNOSIS — M6281 Muscle weakness (generalized): Secondary | ICD-10-CM | POA: Diagnosis not present

## 2016-02-29 DIAGNOSIS — F329 Major depressive disorder, single episode, unspecified: Secondary | ICD-10-CM | POA: Diagnosis not present

## 2016-02-29 DIAGNOSIS — Z9181 History of falling: Secondary | ICD-10-CM | POA: Diagnosis not present

## 2016-02-29 DIAGNOSIS — S82002D Unspecified fracture of left patella, subsequent encounter for closed fracture with routine healing: Secondary | ICD-10-CM | POA: Diagnosis not present

## 2016-03-05 DIAGNOSIS — G309 Alzheimer's disease, unspecified: Secondary | ICD-10-CM | POA: Diagnosis not present

## 2016-03-05 DIAGNOSIS — S82092A Other fracture of left patella, initial encounter for closed fracture: Secondary | ICD-10-CM | POA: Diagnosis not present

## 2016-03-05 DIAGNOSIS — Z9181 History of falling: Secondary | ICD-10-CM | POA: Diagnosis not present

## 2016-03-05 DIAGNOSIS — J449 Chronic obstructive pulmonary disease, unspecified: Secondary | ICD-10-CM | POA: Diagnosis not present

## 2016-03-05 DIAGNOSIS — I10 Essential (primary) hypertension: Secondary | ICD-10-CM | POA: Diagnosis not present

## 2016-03-05 DIAGNOSIS — F329 Major depressive disorder, single episode, unspecified: Secondary | ICD-10-CM | POA: Diagnosis not present

## 2016-03-05 DIAGNOSIS — S82002D Unspecified fracture of left patella, subsequent encounter for closed fracture with routine healing: Secondary | ICD-10-CM | POA: Diagnosis not present

## 2016-03-05 DIAGNOSIS — M6281 Muscle weakness (generalized): Secondary | ICD-10-CM | POA: Diagnosis not present

## 2016-03-07 ENCOUNTER — Telehealth: Payer: Self-pay | Admitting: Orthopedic Surgery

## 2016-03-07 NOTE — Telephone Encounter (Signed)
Burna MortimerWanda from [facility] called asking for information on this patient. She said that they were not aware that the patient had surgery or had been in an assisted facility. [phone]   Please advise

## 2016-03-07 NOTE — Telephone Encounter (Signed)
Notes faxed.

## 2016-03-12 DIAGNOSIS — I10 Essential (primary) hypertension: Secondary | ICD-10-CM | POA: Diagnosis not present

## 2016-03-12 DIAGNOSIS — M6281 Muscle weakness (generalized): Secondary | ICD-10-CM | POA: Diagnosis not present

## 2016-03-12 DIAGNOSIS — Z9181 History of falling: Secondary | ICD-10-CM | POA: Diagnosis not present

## 2016-03-12 DIAGNOSIS — S82002D Unspecified fracture of left patella, subsequent encounter for closed fracture with routine healing: Secondary | ICD-10-CM | POA: Diagnosis not present

## 2016-03-12 DIAGNOSIS — F329 Major depressive disorder, single episode, unspecified: Secondary | ICD-10-CM | POA: Diagnosis not present

## 2016-04-04 DIAGNOSIS — G309 Alzheimer's disease, unspecified: Secondary | ICD-10-CM | POA: Diagnosis not present

## 2016-04-04 DIAGNOSIS — I10 Essential (primary) hypertension: Secondary | ICD-10-CM | POA: Diagnosis not present

## 2016-04-04 DIAGNOSIS — J449 Chronic obstructive pulmonary disease, unspecified: Secondary | ICD-10-CM | POA: Diagnosis not present

## 2016-04-04 DIAGNOSIS — F419 Anxiety disorder, unspecified: Secondary | ICD-10-CM | POA: Diagnosis not present

## 2016-04-04 DIAGNOSIS — Z299 Encounter for prophylactic measures, unspecified: Secondary | ICD-10-CM | POA: Diagnosis not present

## 2016-04-08 ENCOUNTER — Encounter: Payer: Self-pay | Admitting: Orthopedic Surgery

## 2016-04-08 ENCOUNTER — Ambulatory Visit: Payer: Medicare Other | Admitting: Orthopedic Surgery

## 2016-04-08 ENCOUNTER — Ambulatory Visit (INDEPENDENT_AMBULATORY_CARE_PROVIDER_SITE_OTHER): Payer: Medicare Other

## 2016-04-08 VITALS — BP 128/73 | Ht 60.5 in | Wt 146.0 lb

## 2016-04-08 DIAGNOSIS — S82002D Unspecified fracture of left patella, subsequent encounter for closed fracture with routine healing: Secondary | ICD-10-CM | POA: Diagnosis not present

## 2016-04-08 NOTE — Progress Notes (Signed)
Chief Complaint  Patient presents with  . Follow-up    s/p left patella surgery DOS 01/19/16   11 weeks post fracture internal fixation  X-rays show fracture healing clinical exam shows extensor mechanism intact  Placed in hinged economy hinged brace follow-up 4 weeks no x-ray  Okay to use a cane if necessary otherwise continue walker

## 2016-04-10 DIAGNOSIS — S82002D Unspecified fracture of left patella, subsequent encounter for closed fracture with routine healing: Secondary | ICD-10-CM | POA: Diagnosis not present

## 2016-04-10 DIAGNOSIS — F329 Major depressive disorder, single episode, unspecified: Secondary | ICD-10-CM | POA: Diagnosis not present

## 2016-04-10 DIAGNOSIS — I10 Essential (primary) hypertension: Secondary | ICD-10-CM | POA: Diagnosis not present

## 2016-04-10 DIAGNOSIS — Z9181 History of falling: Secondary | ICD-10-CM | POA: Diagnosis not present

## 2016-04-10 DIAGNOSIS — M6281 Muscle weakness (generalized): Secondary | ICD-10-CM | POA: Diagnosis not present

## 2016-05-03 DIAGNOSIS — I10 Essential (primary) hypertension: Secondary | ICD-10-CM | POA: Diagnosis not present

## 2016-05-03 DIAGNOSIS — F419 Anxiety disorder, unspecified: Secondary | ICD-10-CM | POA: Diagnosis not present

## 2016-05-03 DIAGNOSIS — M25562 Pain in left knee: Secondary | ICD-10-CM | POA: Diagnosis not present

## 2016-05-03 DIAGNOSIS — G309 Alzheimer's disease, unspecified: Secondary | ICD-10-CM | POA: Diagnosis not present

## 2016-05-06 ENCOUNTER — Encounter: Payer: Self-pay | Admitting: Orthopedic Surgery

## 2016-05-06 ENCOUNTER — Ambulatory Visit (INDEPENDENT_AMBULATORY_CARE_PROVIDER_SITE_OTHER): Payer: Medicare Other | Admitting: Orthopedic Surgery

## 2016-05-06 VITALS — BP 141/67 | Ht 60.5 in | Wt 142.6 lb

## 2016-05-06 DIAGNOSIS — S82002D Unspecified fracture of left patella, subsequent encounter for closed fracture with routine healing: Secondary | ICD-10-CM | POA: Diagnosis not present

## 2016-05-06 NOTE — Progress Notes (Signed)
Chief Complaint  Patient presents with  . Follow-up    left knee post surgery follow up.   01/19/2016  Review of Systems  Musculoskeletal: Positive for myalgias.   BP (!) 141/67   Ht 5' 0.5" (1.537 m)   Wt 142 lb 9.6 oz (64.7 kg)   BMI 27.39 kg/m   Physical Exam She is awake she is alert she is oriented to her mood is pleasant her gait is with a walker she walks normally she has normal flexion extension. No significant weakness in the quadriceps. Her skin is intact her wound has healed she has minimal edema  She can remove her brace and follow-up with us as needed status post open treatment internal fixation of patella fracture

## 2016-05-21 ENCOUNTER — Telehealth (HOSPITAL_COMMUNITY): Payer: Self-pay | Admitting: *Deleted

## 2016-05-21 NOTE — Telephone Encounter (Signed)
LEFT VOICE MESSAGE REGARDING AN APPOINTMENT. 

## 2016-06-05 ENCOUNTER — Telehealth (HOSPITAL_COMMUNITY): Payer: Self-pay | Admitting: *Deleted

## 2016-06-05 NOTE — Telephone Encounter (Signed)
LEFT VOICE MESSAGE REGARDING AN APPOINTMENT. 

## 2016-06-20 ENCOUNTER — Encounter (HOSPITAL_COMMUNITY): Payer: Self-pay | Admitting: Psychiatry

## 2016-06-20 ENCOUNTER — Ambulatory Visit (INDEPENDENT_AMBULATORY_CARE_PROVIDER_SITE_OTHER): Payer: Medicare Other | Admitting: Psychiatry

## 2016-06-20 ENCOUNTER — Encounter (INDEPENDENT_AMBULATORY_CARE_PROVIDER_SITE_OTHER): Payer: Self-pay

## 2016-06-20 DIAGNOSIS — F331 Major depressive disorder, recurrent, moderate: Secondary | ICD-10-CM | POA: Diagnosis not present

## 2016-06-20 MED ORDER — VENLAFAXINE HCL ER 37.5 MG PO CP24: 37.5000 mg | ORAL_CAPSULE | Freq: Every day | ORAL | 2 refills | Status: DC

## 2016-06-20 MED ORDER — CLONAZEPAM 1 MG PO TABS: 1.0000 mg | ORAL_TABLET | Freq: Every day | ORAL | 0 refills | Status: DC

## 2016-06-20 NOTE — Patient Instructions (Signed)
1. Increase venlafaxine 187.5 mg daily (150 mg + 37.5 mg) 2. Start clonazepam 0.5 mg at night for one week. If it does not work, you may increase to 1 mg at night 3. Return to clinic in one month

## 2016-06-20 NOTE — Progress Notes (Signed)
Psychiatric Initial Adult Assessment   Patient Identification: Bonnie Rush MRN:  [id number] Date of Evaluation:  06/20/2016 Referral Source: Dr. Kirstie PeriShah Ashish Chief Complaint:   Chief Complaint    Anxiety; Depression; New Evaluation; Agitation     Visit Diagnosis:    ICD-9-CM ICD-10-CM   1. Moderate episode of recurrent major depressive disorder (HCC) 296.32 F33.1     History of Present Illness:   Bonnie Rush is a 82 year old female with depression, anxiety, hypertension, hyperlipidemia, irritable bowel syndrome, left patella fracture s/p ORIF presented for mood symptoms.   Patient states that she has "nerve" problems. She admits that she tends to be irritable. She believes that things has been getting worse since she has been off clonazepam. She was told by her PCP that she will not be prescribed on this medication anymore due to legal issues. She endorses insomnia since then, and states she sleeps 5-7 hours without rest. She feels depressed at times and has very low energy. At the same time, she enjoys seeing her great grand son and enjoys reading. She report passive SI but denies any plans, intent or previous attempts. She denies decreased need for sleep. She has occasional panic attacks. She denies alcohol use or drug use.   Patient's daughter is present in the room during the interview.  She takes care of the patient grocery shopping and bills. The patient has been using a walker, otherwise ADL independent. She states that the patient has issues with anger since young. There has been more mood swings of irritability and depression over the past 2 years. She notices that patient was doing better while on clonazepam. The patient has no known history of overusing her opioids or benzodiazepine.  Per [place] database:  05/03/2016 CLONAZEPAM 1 MG TABLET, prescribed 60 tabs for 30 days, [facility] 05/03/2016 HYDROCODON- ACETAMINOPHEN 5- 325, prescribed 60 tabs for 30 days, [facility]  ASHISH CHAMPAKLAL MD   Associated Signs/Symptoms: Depression Symptoms:  depressed mood, anhedonia, insomnia, anxiety, (Hypo) Manic Symptoms:  Irritable Mood, Anxiety Symptoms:  mild anxiety Psychotic Symptoms:  denies PTSD Symptoms: NA  Past Psychiatric History:  She has seen a psychiatrist years ago. She has no known psychiatry admission. Past trials of medication: Venlafaxine, clonazepam, Xanax (AH, VH, paranoia), Trazodone (palpitation)  Previous Psychotropic Medications: Yes   Substance Abuse History in the last 12 months:  No.  Consequences of Substance Abuse: Negative  Past Medical History:  Past Medical History:  Diagnosis Date  . Glaucoma   . High cholesterol   . Hypertension   . Irritable bowel syndrome   . Osteoporosis   . Palpitations     Past Surgical History:  Procedure Laterality Date  . ABDOMINAL HYSTERECTOMY    . BREAST SURGERY    . CHOLECYSTECTOMY    . EYE SURGERY    . ORIF PATELLA Left 01/19/2016   Procedure: OPEN REDUCTION INTERNAL (ORIF) FIXATION LEFT PATELLA  FRACTURE;  Surgeon: Vickki HearingStanley E Harrison, MD;  Location: [facility];  Service: Orthopedics;  Laterality: Left;    Family Psychiatric History:  Sister- on lithium, attempted suicide   Family History: No family history on file.  Social History:   Social History   Social History  . Marital status: Widowed    Spouse name: N/A  . Number of children: N/A  . Years of education: N/A   Social History Main Topics  . Smoking status: Never Smoker  . Smokeless tobacco: None  . Alcohol use No  .  Drug use: No  . Sexual activity: Not Asked   Other Topics Concern  . None   Social History Narrative  . None    Additional Social History:  Patient name is by herself. She is a widow and she has been married 3 times.  Allergies:   Allergies  Allergen Reactions  . Penicillins     Unknown-childhood allergy    Metabolic Disorder Labs: No results found for: HGBA1C, MPG No results found  for: PROLACTIN No results found for: CHOL, TRIG, HDL, CHOLHDL, VLDL, LDLCALC   Current Medications: Current Outpatient Prescriptions  Medication Sig Dispense Refill  . alendronate (FOSAMAX) 70 MG tablet Take 70 mg by mouth once a week. Take with a full glass of water on an empty stomach.Takes on Monday    . atorvastatin (LIPITOR) 20 MG tablet Take 20 mg by mouth daily.    . Calcium Carb-Cholecalciferol (CALCIUM 600 + D PO) Take 1 tablet by mouth daily.    Marland Kitchen dexlansoprazole (DEXILANT) 60 MG capsule Take 60 mg by mouth daily.    . diphenoxylate-atropine (LOMOTIL) 2.5-0.025 MG tablet Take 1 tablet by mouth 4 (four) times daily as needed for diarrhea or loose stools.    . docusate sodium (COLACE) 100 MG capsule Take 1 capsule (100 mg total) by mouth 2 (two) times daily as needed for mild constipation. 20 capsule 0  . fexofenadine (ALLEGRA) 180 MG tablet Take 180 mg by mouth daily.    . Fluticasone-Salmeterol (ADVAIR) 250-50 MCG/DOSE AEPB Inhale 1 puff into the lungs daily.    Marland Kitchen gabapentin (NEURONTIN) 100 MG capsule Take 100 mg by mouth 2 (two) times daily.    . hydrochlorothiazide (HYDRODIURIL) 25 MG tablet Take 25 mg by mouth daily.    Marland Kitchen HYDROcodone-acetaminophen (NORCO/VICODIN) 5-325 MG tablet Take 1 tablet by mouth 2 (two) times daily.    Marland Kitchen latanoprost (XALATAN) 0.005 % ophthalmic solution Place 1 drop into both eyes at bedtime.    . meclizine (ANTIVERT) 25 MG tablet Take 25 mg by mouth 3 (three) times daily as needed for dizziness.    . promethazine (PHENERGAN) 25 MG tablet Take 25 mg by mouth every 6 (six) hours as needed for nausea or vomiting.    . propranolol (INDERAL) 60 MG tablet Take 60 mg by mouth 2 (two) times daily.    . timolol (BETIMOL) 0.5 % ophthalmic solution Place 1 drop into the right eye daily.    Marland Kitchen triamcinolone cream (KENALOG) 0.1 % Apply 1 application topically daily.    Marland Kitchen venlafaxine XR (EFFEXOR-XR) 150 MG 24 hr capsule Take 150 mg by mouth daily with breakfast.    .  clonazePAM (KLONOPIN) 1 MG tablet Take 1 tablet (1 mg total) by mouth at bedtime. Take 0.5 mg at night for a week, then you may increase to 1 mg at night 30 tablet 0  . venlafaxine XR (EFFEXOR-XR) 37.5 MG 24 hr capsule Take 1 capsule (37.5 mg total) by mouth daily. Take this pill in addition to venlafaxine 150 mg (Total of 187.5mg  per day) 30 capsule 2   No current facility-administered medications for this visit.     Neurologic: Headache: No Seizure: No Paresthesias:No  Musculoskeletal: Strength & Muscle Tone: within normal limits Gait & Station: normal Patient leans: N/A  Psychiatric Specialty Exam: ROS  Blood pressure (!) 171/93, pulse 66, height 5' 0.5" (1.537 m), weight 143 lb (64.9 kg), SpO2 93 %.Body mass index is 27.47 kg/m.  General Appearance: Fairly Groomed  Eye Contact:  Fair  Speech:  Slow, occasional delay with hearing impairment  Volume:  Normal  Mood:  Depressed  Affect:  Appropriate, slightly restricted  Thought Process:  Coherent circumstantial at times   Orientation:  Full (Time, Place, and Person)  Thought Content:  Logical Perceptions: denies AH VH   Suicidal Thoughts:  Yes.  without intent/plan  Homicidal Thoughts:  No  Memory:  Immediate;   Fair Recent;   Fair Remote;   Fair  Judgement:  Fair  Insight:  Present  Psychomotor Activity:  Decreased  Concentration:  Concentration: Fair and Attention Span: Fair  Recall:  [employer] of Knowledge:Fair  Language: Fair  Akathisia:  NA  Handed:  Right  AIMS (if indicated):  NA  Assets:  Desire for Improvement Social Support  ADL's:  Intact  Cognition: WNL  Sleep:  poor   Assessment KARN DERK is a 82 year old female with depression, anxiety, hypertension, hyperlipidemia, irritable bowel syndrome, left patella fracture s/p ORIF presented for mood symptoms.   # MDD # Unspecified anxiety disorder Patient endorses chronic irritability and insomnia, which has been worse since discontinued on  clonazepam. Will increase venlafaxine to target her mood. Will do slow titration given her age and her history of hypertension. Will restart clonazepam from lower dose given both patient and her daughter reports great benefit from this medication, and there were no concern for medication over use. Risks of falls, drowsiness, respiratory depression especially in the setting of concomitant use with opiates were discussed with patient and her daughter.   Plan 1. Increase venlafaxine 187.5 mg daily (150 mg + 37.5 mg) - monitor BP 2. Start clonazepam 0.5 mg qhs for one week, then patient may increase up to 1 mg qhs  3. Return to clinic in one month  Treatment Plan Summary: Plan as above  The patient demonstrates the following  risk factors for suicide: Chronic risk factors for suicide include psychiatric disorder,  demographic factors ( >38 yo). Acute risk factors for suicide include none. Protective factors for this patient include positive social support, hope for the future. Considering these factors, the overall suicide risk at this point appears to be low.  [name], MD 9/28/201712:09 PM

## 2016-07-10 NOTE — Progress Notes (Signed)
BH MD/PA/NP OP Progress Note  07/12/2016 11:09 AM Bonnie Rush  MRN:  [id number]  Chief Complaint:  Chief Complaint    Depression; Follow-up     Subjective:  "I'm doing good" HPI:  Patient states that she has been doing well since the last appointment. She wishes her Klonopin to be increased. She reports good sleep except last night. She denies SI, AH, VH.  Her daughter presented during the entire interview. Patient appears to be doing better with clonazepam 0.5 mg BID. She does not seem to be shaky anymore. She continues to get irritable and had mood swings. Daughter reports that she has not had fell since decreasing clonazepam.  (They did not increase Effexor by accident.)  Visit Diagnosis:    ICD-9-CM ICD-10-CM   1. Moderate episode of recurrent major depressive disorder (HCC) 296.32 F33.1     Past Psychiatric History:  She has seen a psychiatrist years ago. She has no known psychiatry admission. Past trials of medication: Venlafaxine, clonazepam, Xanax (AH, VH, paranoia), Trazodone (palpitation)   Past Medical History:  Past Medical History:  Diagnosis Date  . Glaucoma   . High cholesterol   . Hypertension   . Irritable bowel syndrome   . Osteoporosis   . Palpitations     Past Surgical History:  Procedure Laterality Date  . ABDOMINAL HYSTERECTOMY    . BREAST SURGERY    . CHOLECYSTECTOMY    . EYE SURGERY    . ORIF PATELLA Left 01/19/2016   Procedure: OPEN REDUCTION INTERNAL (ORIF) FIXATION LEFT PATELLA  FRACTURE;  Surgeon: Vickki Hearing, MD;  Location: [facility];  Service: Orthopedics;  Laterality: Left;    Family Psychiatric History:  She has seen a psychiatrist years ago. She has no known psychiatry admission. Past trials of medication: Venlafaxine, clonazepam, Xanax (AH, VH, paranoia), Trazodone (palpitation)   Family History: No family history on file.  Social History:  Social History   Social History  . Marital status: Widowed    Spouse name: N/A   . Number of children: N/A  . Years of education: N/A   Social History Main Topics  . Smoking status: Never Smoker  . Smokeless tobacco: None  . Alcohol use No  . Drug use: No  . Sexual activity: Not Asked   Other Topics Concern  . None   Social History Narrative  . None    Allergies:  Allergies  Allergen Reactions  . Penicillins     Unknown-childhood allergy    Metabolic Disorder Labs: No results found for: HGBA1C, MPG No results found for: PROLACTIN No results found for: CHOL, TRIG, HDL, CHOLHDL, VLDL, LDLCALC   Current Medications: Current Outpatient Prescriptions  Medication Sig Dispense Refill  . alendronate (FOSAMAX) 70 MG tablet Take 70 mg by mouth once a week. Take with a full glass of water on an empty stomach.Takes on Monday    . atorvastatin (LIPITOR) 20 MG tablet Take 20 mg by mouth daily.    . Calcium Carb-Cholecalciferol (CALCIUM 600 + D PO) Take 1 tablet by mouth daily.    . clonazePAM (KLONOPIN) 1 MG tablet Take 0.5 mg daily and 1 mg at night 45 tablet 0  . dexlansoprazole (DEXILANT) 60 MG capsule Take 60 mg by mouth daily.    . diphenoxylate-atropine (LOMOTIL) 2.5-0.025 MG tablet Take 1 tablet by mouth 4 (four) times daily as needed for diarrhea or loose stools.    . docusate sodium (COLACE) 100 MG capsule Take 1 capsule (100 mg total)  by mouth 2 (two) times daily as needed for mild constipation. 20 capsule 0  . fexofenadine (ALLEGRA) 180 MG tablet Take 180 mg by mouth daily.    . Fluticasone-Salmeterol (ADVAIR) 250-50 MCG/DOSE AEPB Inhale 1 puff into the lungs daily.    Marland Kitchen gabapentin (NEURONTIN) 100 MG capsule Take 100 mg by mouth 2 (two) times daily.    . hydrochlorothiazide (HYDRODIURIL) 25 MG tablet Take 25 mg by mouth daily.    Marland Kitchen HYDROcodone-acetaminophen (NORCO/VICODIN) 5-325 MG tablet Take 1 tablet by mouth 2 (two) times daily.    Marland Kitchen latanoprost (XALATAN) 0.005 % ophthalmic solution Place 1 drop into both eyes at bedtime.    . meclizine (ANTIVERT)  25 MG tablet Take 25 mg by mouth 3 (three) times daily as needed for dizziness.    . promethazine (PHENERGAN) 25 MG tablet Take 25 mg by mouth every 6 (six) hours as needed for nausea or vomiting.    . propranolol (INDERAL) 60 MG tablet Take 60 mg by mouth 2 (two) times daily.    . timolol (BETIMOL) 0.5 % ophthalmic solution Place 1 drop into the right eye daily.    Marland Kitchen triamcinolone cream (KENALOG) 0.1 % Apply 1 application topically daily.    Marland Kitchen venlafaxine XR (EFFEXOR-XR) 150 MG 24 hr capsule Take 150 mg by mouth daily with breakfast.    . venlafaxine XR (EFFEXOR-XR) 37.5 MG 24 hr capsule Take 1 capsule (37.5 mg total) by mouth daily. Take this pill in addition to venlafaxine 150 mg (Total of 187.5mg  per day) 30 capsule 2   No current facility-administered medications for this visit.     Neurologic: Headache: No Seizure: No Paresthesias: No  Musculoskeletal: Strength & Muscle Tone: within normal limits Gait & Station: normal Patient leans: N/A  Psychiatric Specialty Exam: Review of Systems  Musculoskeletal: Positive for back pain, joint pain and neck pain.  Psychiatric/Behavioral: Negative for depression, hallucinations, substance abuse and suicidal ideas. The patient is nervous/anxious. The patient does not have insomnia.   All other systems reviewed and are negative.   Blood pressure 136/73, pulse (!) 56, height 5' 0.5" (1.537 m), weight 144 lb 12.8 oz (65.7 kg).Body mass index is 27.81 kg/m.  General Appearance: Fairly Groomed  Eye Contact:  Good  Speech:  Clear and Coherent  Volume:  Normal  Mood:  "good"  Affect:  Restricted  Thought Process:  Coherent and Goal Directed  Orientation:  Full (Time, Place, and Person)  Thought Content: Logical Perceptions: denies AH/VH  Suicidal Thoughts:  No  Homicidal Thoughts:  No  Memory:  Immediate;   Fair Recent;   Fair Remote;   Fair  Judgement:  Fair  Insight:  Shallow  Psychomotor Activity:  Normal  Concentration:   Concentration: Fair and Attention Span: Fair  Recall:  [employer] of Knowledge: Good  Language: Good  Akathisia:  No  Handed:  Right  AIMS (if indicated):  N/A  Assets:  Communication Skills Desire for Improvement  ADL's:  Intact  Cognition: WNL  Sleep:  good   Assessment Bonnie Rush is a 82 year old female with depression, anxiety, hypertension, hyperlipidemia, irritable bowel syndrome, left patella fracture s/p ORIF presented for mood symptoms.   # MDD # Unspecified anxiety disorder There has been some improvement in her chronic irritability and insomnia. Her venlafaxine was not increased by accident; instructed patient and her daughter to increase the dose. Will increase clonazepam given ongoing mood swings and a history of good response while on higher  dose of this medication; discussed risks which includes falls, drowsiness, respiratory depression especially in the setting of concomitant use of opioids.   Plan 1. Increase venlafaxine 187.5 mg daily (150 mg + 37.5 mg) - monitor BP 2. Increase clonazepam 0.5 mg daily and 1 mg at night 3. Return to clinic in one month  Treatment Plan Summary: Plan as above  The patient demonstrates the following  risk factors for suicide: Chronic risk factors for suicide include psychiatric disorder,  demographic factors 69( >60 yo). Acute risk factors for suicide include none. Protective factors for this patient include positive social support, hope for the future. Considering these factors, the overall suicide risk at this point appears to be low.   Neysa Hottereina Danila Eddie, MD 07/12/2016, 11:09 AM

## 2016-07-12 ENCOUNTER — Encounter (HOSPITAL_COMMUNITY): Payer: Self-pay | Admitting: Psychiatry

## 2016-07-12 ENCOUNTER — Ambulatory Visit (INDEPENDENT_AMBULATORY_CARE_PROVIDER_SITE_OTHER): Payer: Medicare Other | Admitting: Psychiatry

## 2016-07-12 VITALS — BP 136/73 | HR 56 | Ht 60.5 in | Wt 144.8 lb

## 2016-07-12 DIAGNOSIS — Z79899 Other long term (current) drug therapy: Secondary | ICD-10-CM | POA: Diagnosis not present

## 2016-07-12 DIAGNOSIS — F331 Major depressive disorder, recurrent, moderate: Secondary | ICD-10-CM

## 2016-07-12 MED ORDER — CLONAZEPAM 1 MG PO TABS: ORAL_TABLET | ORAL | 0 refills | Status: DC

## 2016-07-12 NOTE — Patient Instructions (Addendum)
1. Increase venlafaxine 187.5 mg daily (150 mg + 37.5 mg)  2. Increase clonazepam 0.5 mg daily and 1 mg at night 3. Return to clinic in one month

## 2016-07-29 DIAGNOSIS — M159 Polyosteoarthritis, unspecified: Secondary | ICD-10-CM | POA: Diagnosis not present

## 2016-07-29 DIAGNOSIS — I10 Essential (primary) hypertension: Secondary | ICD-10-CM | POA: Diagnosis not present

## 2016-07-29 DIAGNOSIS — J449 Chronic obstructive pulmonary disease, unspecified: Secondary | ICD-10-CM | POA: Diagnosis not present

## 2016-08-07 NOTE — Progress Notes (Deleted)
BH MD/PA/NP OP Progress Note  08/07/2016 12:23 PM Bonnie Rush  MRN:  [id number]  Chief Complaint:   Subjective:  "I'm doing good" HPI:  Patient states that she has been doing well since the last appointment. She wishes her Klonopin to be increased. She reports good sleep except last night. She denies SI, AH, VH.  Her daughter presented during the entire interview. Patient appears to be doing better with clonazepam 0.5 mg BID. She does not seem to be shaky anymore. She continues to get irritable and had mood swings. Daughter reports that she has not had fell since decreasing clonazepam.  (They did not increase Effexor by accident.)  Visit Diagnosis:  No diagnosis found.  Past Psychiatric History:  She has seen a psychiatrist years ago. She has no known psychiatry admission. Past trials of medication: Venlafaxine, clonazepam, Xanax (AH, VH, paranoia), Trazodone (palpitation)   Past Medical History:  Past Medical History:  Diagnosis Date  . Glaucoma   . High cholesterol   . Hypertension   . Irritable bowel syndrome   . Osteoporosis   . Palpitations     Past Surgical History:  Procedure Laterality Date  . ABDOMINAL HYSTERECTOMY    . BREAST SURGERY    . CHOLECYSTECTOMY    . EYE SURGERY    . ORIF PATELLA Left 01/19/2016   Procedure: OPEN REDUCTION INTERNAL (ORIF) FIXATION LEFT PATELLA  FRACTURE;  Surgeon: Vickki HearingStanley E Harrison, MD;  Location: [facility];  Service: Orthopedics;  Laterality: Left;    Family Psychiatric History:  She has seen a psychiatrist years ago. She has no known psychiatry admission. Past trials of medication: Venlafaxine, clonazepam, Xanax (AH, VH, paranoia), Trazodone (palpitation)   Family History: No family history on file.  Social History:  Social History   Social History  . Marital status: Widowed    Spouse name: N/A  . Number of children: N/A  . Years of education: N/A   Social History Main Topics  . Smoking status: Never Smoker  .  Smokeless tobacco: Not on file  . Alcohol use No  . Drug use: No  . Sexual activity: Not on file   Other Topics Concern  . Not on file   Social History Narrative  . No narrative on file    Allergies:  Allergies  Allergen Reactions  . Penicillins     Unknown-childhood allergy    Metabolic Disorder Labs: No results found for: HGBA1C, MPG No results found for: PROLACTIN No results found for: CHOL, TRIG, HDL, CHOLHDL, VLDL, LDLCALC   Current Medications: Current Outpatient Prescriptions  Medication Sig Dispense Refill  . alendronate (FOSAMAX) 70 MG tablet Take 70 mg by mouth once a week. Take with a full glass of water on an empty stomach.Takes on Monday    . atorvastatin (LIPITOR) 20 MG tablet Take 20 mg by mouth daily.    . Calcium Carb-Cholecalciferol (CALCIUM 600 + D PO) Take 1 tablet by mouth daily.    . clonazePAM (KLONOPIN) 1 MG tablet Take 0.5 mg daily and 1 mg at night 45 tablet 0  . dexlansoprazole (DEXILANT) 60 MG capsule Take 60 mg by mouth daily.    . diphenoxylate-atropine (LOMOTIL) 2.5-0.025 MG tablet Take 1 tablet by mouth 4 (four) times daily as needed for diarrhea or loose stools.    . docusate sodium (COLACE) 100 MG capsule Take 1 capsule (100 mg total) by mouth 2 (two) times daily as needed for mild constipation. 20 capsule 0  . fexofenadine (ALLEGRA) 180  MG tablet Take 180 mg by mouth daily.    . Fluticasone-Salmeterol (ADVAIR) 250-50 MCG/DOSE AEPB Inhale 1 puff into the lungs daily.    Marland Kitchen. gabapentin (NEURONTIN) 100 MG capsule Take 100 mg by mouth 2 (two) times daily.    . hydrochlorothiazide (HYDRODIURIL) 25 MG tablet Take 25 mg by mouth daily.    Marland Kitchen. HYDROcodone-acetaminophen (NORCO/VICODIN) 5-325 MG tablet Take 1 tablet by mouth 2 (two) times daily.    Marland Kitchen. latanoprost (XALATAN) 0.005 % ophthalmic solution Place 1 drop into both eyes at bedtime.    . meclizine (ANTIVERT) 25 MG tablet Take 25 mg by mouth 3 (three) times daily as needed for dizziness.    .  promethazine (PHENERGAN) 25 MG tablet Take 25 mg by mouth every 6 (six) hours as needed for nausea or vomiting.    . propranolol (INDERAL) 60 MG tablet Take 60 mg by mouth 2 (two) times daily.    . timolol (BETIMOL) 0.5 % ophthalmic solution Place 1 drop into the right eye daily.    Marland Kitchen. triamcinolone cream (KENALOG) 0.1 % Apply 1 application topically daily.    Marland Kitchen. venlafaxine XR (EFFEXOR-XR) 150 MG 24 hr capsule Take 150 mg by mouth daily with breakfast.    . venlafaxine XR (EFFEXOR-XR) 37.5 MG 24 hr capsule Take 1 capsule (37.5 mg total) by mouth daily. Take this pill in addition to venlafaxine 150 mg (Total of 187.5mg  per day) 30 capsule 2   No current facility-administered medications for this visit.     Neurologic: Headache: No Seizure: No Paresthesias: No  Musculoskeletal: Strength & Muscle Tone: within normal limits Gait & Station: normal Patient leans: N/A  Psychiatric Specialty Exam: Review of Systems  Musculoskeletal: Positive for back pain, joint pain and neck pain.  Psychiatric/Behavioral: Negative for depression, hallucinations, substance abuse and suicidal ideas. The patient is nervous/anxious. The patient does not have insomnia.   All other systems reviewed and are negative.   There were no vitals taken for this visit.There is no height or weight on file to calculate BMI.  General Appearance: Fairly Groomed  Eye Contact:  Good  Speech:  Clear and Coherent  Volume:  Normal  Mood:  "good"  Affect:  Restricted  Thought Process:  Coherent and Goal Directed  Orientation:  Full (Time, Place, and Person)  Thought Content: Logical Perceptions: denies AH/VH  Suicidal Thoughts:  No  Homicidal Thoughts:  No  Memory:  Immediate;   Fair Recent;   Fair Remote;   Fair  Judgement:  Fair  Insight:  Shallow  Psychomotor Activity:  Normal  Concentration:  Concentration: Fair and Attention Span: Fair  Recall:  [employer]  Fund of Knowledge: Good  Language: Good  Akathisia:  No   Handed:  Right  AIMS (if indicated):  N/A  Assets:  Communication Skills Desire for Improvement  ADL's:  Intact  Cognition: WNL  Sleep:  good   Assessment Bonnie Rush is a 82 year old female with depression, anxiety, hypertension, hyperlipidemia, irritable bowel syndrome, left patella fracture s/p ORIF presented for mood symptoms.   # MDD # Unspecified anxiety disorder There has been some improvement in her chronic irritability and insomnia. Her venlafaxine was not increased by accident; instructed patient and her daughter to increase the dose. Will increase clonazepam given ongoing mood swings and a history of good response while on higher dose of this medication; discussed risks which includes falls, drowsiness, respiratory depression especially in the setting of concomitant use of opioids.   Plan  1. Increase venlafaxine 187.5 mg daily (150 mg + 37.5 mg) - monitor BP 2. Increase clonazepam 0.5 mg daily and 1 mg at night 3. Return to clinic in one month  Treatment Plan Summary: Plan as above  The patient demonstrates the following  risk factors for suicide: Chronic risk factors for suicide include psychiatric disorder,  demographic factors ( >44 yo). Acute risk factors for suicide include none. Protective factors for this patient include positive social support, hope for the future. Considering these factors, the overall suicide risk at this point appears to be low.   [name], MD 08/07/2016, 12:23 PM

## 2016-08-08 DIAGNOSIS — M810 Age-related osteoporosis without current pathological fracture: Secondary | ICD-10-CM | POA: Diagnosis not present

## 2016-08-09 ENCOUNTER — Ambulatory Visit (HOSPITAL_COMMUNITY): Payer: Self-pay | Admitting: Psychiatry

## 2016-08-12 NOTE — Progress Notes (Signed)
BH MD/PA/NP OP Progress Note  08/13/2016 2:14 PM Lura EmDoris H Ruperto  MRN:  [id number]  Chief Complaint:  Chief Complaint    Depression; Follow-up     Subjective:  "I'm nervous as cat" HPI:  Patient states that she feels "nervous as cat." She talks about GI symptoms and it makes her feel upset. She feels irritable at times. She reports feeling down as she is unable to drive a car. She used to enjoy meeting with her friend and drive her to grocery shopping. She is unable to read as she wishes since removal of her eye a few years ago. She discontinued 37.5 mg Effexor as she does not take two antidepressant (she was informed that this is the same medication she has been taking, not taking two types of medication). She denies insomnia. She report good appetite. She continues to feel shaky at times and would like to increase clonazepam. She denies SI, AH/VH.  Her daughter presented during the entire interview. There has been no change in her mood since the last encounter. Patient has been taking care of her medication.   Visit Diagnosis:    ICD-9-CM ICD-10-CM   1. Moderate episode of recurrent major depressive disorder (HCC) 296.32 F33.1     Past Psychiatric History:  She has seen a psychiatrist years ago. She has no known psychiatry admission. Past trials of medication: Venlafaxine, clonazepam, Xanax (AH, VH, paranoia), Trazodone (palpitation)   Past Medical History:  Past Medical History:  Diagnosis Date  . Glaucoma   . High cholesterol   . Hypertension   . Irritable bowel syndrome   . Osteoporosis   . Palpitations     Past Surgical History:  Procedure Laterality Date  . ABDOMINAL HYSTERECTOMY    . BREAST SURGERY    . CHOLECYSTECTOMY    . EYE SURGERY    . ORIF PATELLA Left 01/19/2016   Procedure: OPEN REDUCTION INTERNAL (ORIF) FIXATION LEFT PATELLA  FRACTURE;  Surgeon: Vickki HearingStanley E Harrison, MD;  Location: [facility];  Service: Orthopedics;  Laterality: Left;    Family Psychiatric  History:  She has seen a psychiatrist years ago. She has no known psychiatry admission. Past trials of medication: Venlafaxine, clonazepam, Xanax (AH, VH, paranoia), Trazodone (palpitation)   Family History: No family history on file.  Social History:  Social History   Social History  . Marital status: Widowed    Spouse name: N/A  . Number of children: N/A  . Years of education: N/A   Social History Main Topics  . Smoking status: Never Smoker  . Smokeless tobacco: Not on file  . Alcohol use No  . Drug use: No  . Sexual activity: Not on file   Other Topics Concern  . Not on file   Social History Narrative  . No narrative on file    Allergies:  Allergies  Allergen Reactions  . Penicillins     Unknown-childhood allergy    Metabolic Disorder Labs: No results found for: HGBA1C, MPG No results found for: PROLACTIN No results found for: CHOL, TRIG, HDL, CHOLHDL, VLDL, LDLCALC   Current Medications: Current Outpatient Prescriptions  Medication Sig Dispense Refill  . alendronate (FOSAMAX) 70 MG tablet Take 70 mg by mouth once a week. Take with a full glass of water on an empty stomach.Takes on Monday    . atorvastatin (LIPITOR) 20 MG tablet Take 20 mg by mouth daily.    . Calcium Carb-Cholecalciferol (CALCIUM 600 + D PO) Take 1 tablet by mouth daily.    .Marland Kitchen  clonazePAM (KLONOPIN) 1 MG tablet Take 0.5 mg daily and 1 mg at night 45 tablet 1  . dexlansoprazole (DEXILANT) 60 MG capsule Take 60 mg by mouth daily.    . diphenoxylate-atropine (LOMOTIL) 2.5-0.025 MG tablet Take 1 tablet by mouth 4 (four) times daily as needed for diarrhea or loose stools.    . docusate sodium (COLACE) 100 MG capsule Take 1 capsule (100 mg total) by mouth 2 (two) times daily as needed for mild constipation. 20 capsule 0  . fexofenadine (ALLEGRA) 180 MG tablet Take 180 mg by mouth daily.    . Fluticasone-Salmeterol (ADVAIR) 250-50 MCG/DOSE AEPB Inhale 1 puff into the lungs daily.    Marland Kitchen gabapentin  (NEURONTIN) 100 MG capsule Take 100 mg by mouth 2 (two) times daily.    . hydrochlorothiazide (HYDRODIURIL) 25 MG tablet Take 25 mg by mouth daily.    Marland Kitchen HYDROcodone-acetaminophen (NORCO/VICODIN) 5-325 MG tablet Take 1 tablet by mouth 2 (two) times daily.    Marland Kitchen latanoprost (XALATAN) 0.005 % ophthalmic solution Place 1 drop into both eyes at bedtime.    . meclizine (ANTIVERT) 25 MG tablet Take 25 mg by mouth 3 (three) times daily as needed for dizziness.    . promethazine (PHENERGAN) 25 MG tablet Take 25 mg by mouth every 6 (six) hours as needed for nausea or vomiting.    . propranolol (INDERAL) 60 MG tablet Take 60 mg by mouth 2 (two) times daily.    . timolol (BETIMOL) 0.5 % ophthalmic solution Place 1 drop into the right eye daily.    Marland Kitchen triamcinolone cream (KENALOG) 0.1 % Apply 1 application topically daily.    Marland Kitchen venlafaxine XR (EFFEXOR-XR) 150 MG 24 hr capsule Take 1 capsule (150 mg total) by mouth daily with breakfast. 30 capsule 1  . venlafaxine XR (EFFEXOR-XR) 37.5 MG 24 hr capsule Take 1 capsule (37.5 mg total) by mouth daily. Take this pill in addition to venlafaxine 150 mg (Total of 187.5mg  per day) 30 capsule 2   No current facility-administered medications for this visit.     Neurologic: Headache: No Seizure: No Paresthesias: No  Musculoskeletal: Strength & Muscle Tone: decreased Gait & Station: unsteady, uses a walker Patient leans: N/A  Psychiatric Specialty Exam: Review of Systems  Musculoskeletal: Positive for back pain, joint pain and neck pain.  Psychiatric/Behavioral: Positive for depression. Negative for hallucinations, substance abuse and suicidal ideas. The patient is nervous/anxious. The patient does not have insomnia.   All other systems reviewed and are negative.   Blood pressure (!) 159/76, pulse 68, resp. rate 20, weight 148 lb 3.2 oz (67.2 kg).Body mass index is 28.47 kg/m.  General Appearance: Fairly Groomed  Eye Contact:  Good  Speech:  Clear and Coherent   Volume:  Normal  Mood:  "good"  Affect:  Restricted  Thought Process:  Coherent and Goal Directed  Orientation:  Full (Time, Place, and Person)  Thought Content: Logical Perceptions: denies AH/VH  Suicidal Thoughts:  No  Homicidal Thoughts:  No  Memory:  Immediate;   Fair Recent;   Fair Remote;   Fair  Judgement:  Fair  Insight:  Shallow  Psychomotor Activity:  Normal  Concentration:  Concentration: Fair and Attention Span: Fair  Recall:  [employer] of Knowledge: Good  Language: Good  Akathisia:  No  Handed:  Right  AIMS (if indicated):  N/A  Assets:  Communication Skills Desire for Improvement  ADL's:  Intact  Cognition: WNL  Sleep:  good   Assessment Klee  Silvana NewnessH Mones is a 82 year old female with depression, anxiety, hypertension, hyperlipidemia, irritable bowel syndrome, left patella fracture s/p ORIF presented for mood symptoms.   # MDD # Unspecified anxiety disorder Patient continues to endorse irritability, anxiety and some neurovegetative symptoms. Patient did not increase venlafaxine due to her misunderstanding; provided through psychoeducation and she agrees to increase the dose. Discussed risks including hypertension.  Continue clonazepam at the current dose. Provided supportive therapy for demoralization and patient is encouraged for behavioral activation.   Plan 1. Increase venlafaxine 187.5 mg daily (150 mg + 37.5 mg)  2. Continue clonazepam 0.5 mg daily and 1 mg at night 3. Return to clinic in January  Treatment Plan Summary: Plan as above  The patient demonstrates the following  risk factors for suicide: Chronic risk factors for suicide include psychiatric disorder,  demographic factors 77( >60 yo). Acute risk factors for suicide include none. Protective factors for this patient include positive social support, hope for the future. Considering these factors, the overall suicide risk at this point appears to be low.   Neysa Hottereina Azaiah Mello, MD 08/13/2016, 2:14 PM

## 2016-08-13 ENCOUNTER — Ambulatory Visit (INDEPENDENT_AMBULATORY_CARE_PROVIDER_SITE_OTHER): Payer: Medicare Other | Admitting: Psychiatry

## 2016-08-13 VITALS — BP 159/76 | HR 68 | Resp 20 | Wt 148.2 lb

## 2016-08-13 DIAGNOSIS — Z79899 Other long term (current) drug therapy: Secondary | ICD-10-CM | POA: Diagnosis not present

## 2016-08-13 DIAGNOSIS — F331 Major depressive disorder, recurrent, moderate: Secondary | ICD-10-CM | POA: Diagnosis not present

## 2016-08-13 MED ORDER — CLONAZEPAM 1 MG PO TABS
ORAL_TABLET | ORAL | 1 refills | Status: DC
Start: 2016-08-13 — End: 2016-10-15

## 2016-08-13 MED ORDER — VENLAFAXINE HCL ER 150 MG PO CP24: 150.0000 mg | ORAL_CAPSULE | Freq: Every day | ORAL | 1 refills | Status: DC

## 2016-08-13 NOTE — Patient Instructions (Addendum)
1. Increase venlafaxine 187.5 mg daily (150 mg + 37.5 mg)  2. Continue clonazepam 0.5 mg daily and 1 mg at night 3. Return to clinic in January

## 2016-10-07 NOTE — Progress Notes (Deleted)
BH MD/PA/NP OP Progress Note  10/07/2016 8:59 AM Bonnie Rush  MRN:  [id number]  Chief Complaint:   Subjective:  "I'm nervous as cat" HPI:  Patient states that she feels "nervous as cat." She talks about GI symptoms and it makes her feel upset. She feels irritable at times. She reports feeling down as she is unable to drive a car. She used to enjoy meeting with her friend and drive her to grocery shopping. She is unable to read as she wishes since removal of her eye a few years ago. She discontinued 37.5 mg Effexor as she does not take two antidepressant (she was informed that this is the same medication she has been taking, not taking two types of medication). She denies insomnia. She report good appetite. She continues to feel shaky at times and would like to increase clonazepam. She denies SI, AH/VH.  Her daughter presented during the entire interview. There has been no change in her mood since the last encounter. Patient has been taking care of her medication.   Visit Diagnosis:  No diagnosis found.  Past Psychiatric History:  She has seen a psychiatrist years ago. She has no known psychiatry admission. Past trials of medication: Venlafaxine, clonazepam, Xanax (AH, VH, paranoia), Trazodone (palpitation)   Past Medical History:  Past Medical History:  Diagnosis Date  . Glaucoma   . High cholesterol   . Hypertension   . Irritable bowel syndrome   . Osteoporosis   . Palpitations     Past Surgical History:  Procedure Laterality Date  . ABDOMINAL HYSTERECTOMY    . BREAST SURGERY    . CHOLECYSTECTOMY    . EYE SURGERY    . ORIF PATELLA Left 01/19/2016   Procedure: OPEN REDUCTION INTERNAL (ORIF) FIXATION LEFT PATELLA  FRACTURE;  Surgeon: Vickki HearingStanley E Harrison, MD;  Location: [facility];  Service: Orthopedics;  Laterality: Left;    Family Psychiatric History:  She has seen a psychiatrist years ago. She has no known psychiatry admission. Past trials of medication: Venlafaxine,  clonazepam, Xanax (AH, VH, paranoia), Trazodone (palpitation)   Family History: No family history on file.  Social History:  Social History   Social History  . Marital status: Widowed    Spouse name: N/A  . Number of children: N/A  . Years of education: N/A   Social History Main Topics  . Smoking status: Never Smoker  . Smokeless tobacco: Not on file  . Alcohol use No  . Drug use: No  . Sexual activity: Not on file   Other Topics Concern  . Not on file   Social History Narrative  . No narrative on file    Allergies:  Allergies  Allergen Reactions  . Penicillins     Unknown-childhood allergy    Metabolic Disorder Labs: No results found for: HGBA1C, MPG No results found for: PROLACTIN No results found for: CHOL, TRIG, HDL, CHOLHDL, VLDL, LDLCALC   Current Medications: Current Outpatient Prescriptions  Medication Sig Dispense Refill  . alendronate (FOSAMAX) 70 MG tablet Take 70 mg by mouth once a week. Take with a full glass of water on an empty stomach.Takes on Monday    . atorvastatin (LIPITOR) 20 MG tablet Take 20 mg by mouth daily.    . Calcium Carb-Cholecalciferol (CALCIUM 600 + D PO) Take 1 tablet by mouth daily.    . clonazePAM (KLONOPIN) 1 MG tablet Take 0.5 mg daily and 1 mg at night 45 tablet 1  . dexlansoprazole (DEXILANT) 60 MG capsule  Take 60 mg by mouth daily.    . diphenoxylate-atropine (LOMOTIL) 2.5-0.025 MG tablet Take 1 tablet by mouth 4 (four) times daily as needed for diarrhea or loose stools.    . docusate sodium (COLACE) 100 MG capsule Take 1 capsule (100 mg total) by mouth 2 (two) times daily as needed for mild constipation. 20 capsule 0  . fexofenadine (ALLEGRA) 180 MG tablet Take 180 mg by mouth daily.    . Fluticasone-Salmeterol (ADVAIR) 250-50 MCG/DOSE AEPB Inhale 1 puff into the lungs daily.    Marland Kitchen gabapentin (NEURONTIN) 100 MG capsule Take 100 mg by mouth 2 (two) times daily.    . hydrochlorothiazide (HYDRODIURIL) 25 MG tablet Take 25 mg  by mouth daily.    Marland Kitchen HYDROcodone-acetaminophen (NORCO/VICODIN) 5-325 MG tablet Take 1 tablet by mouth 2 (two) times daily.    Marland Kitchen latanoprost (XALATAN) 0.005 % ophthalmic solution Place 1 drop into both eyes at bedtime.    . meclizine (ANTIVERT) 25 MG tablet Take 25 mg by mouth 3 (three) times daily as needed for dizziness.    . promethazine (PHENERGAN) 25 MG tablet Take 25 mg by mouth every 6 (six) hours as needed for nausea or vomiting.    . propranolol (INDERAL) 60 MG tablet Take 60 mg by mouth 2 (two) times daily.    . timolol (BETIMOL) 0.5 % ophthalmic solution Place 1 drop into the right eye daily.    Marland Kitchen triamcinolone cream (KENALOG) 0.1 % Apply 1 application topically daily.    Marland Kitchen venlafaxine XR (EFFEXOR-XR) 150 MG 24 hr capsule Take 1 capsule (150 mg total) by mouth daily with breakfast. 30 capsule 1  . venlafaxine XR (EFFEXOR-XR) 37.5 MG 24 hr capsule Take 1 capsule (37.5 mg total) by mouth daily. Take this pill in addition to venlafaxine 150 mg (Total of 187.5mg  per day) 30 capsule 2   No current facility-administered medications for this visit.     Neurologic: Headache: No Seizure: No Paresthesias: No  Musculoskeletal: Strength & Muscle Tone: decreased Gait & Station: unsteady, uses a walker Patient leans: N/A  Psychiatric Specialty Exam: Review of Systems  Musculoskeletal: Positive for back pain, joint pain and neck pain.  Psychiatric/Behavioral: Positive for depression. Negative for hallucinations, substance abuse and suicidal ideas. The patient is nervous/anxious. The patient does not have insomnia.   All other systems reviewed and are negative.   There were no vitals taken for this visit.There is no height or weight on file to calculate BMI.  General Appearance: Fairly Groomed  Eye Contact:  Good  Speech:  Clear and Coherent  Volume:  Normal  Mood:  "good"  Affect:  Restricted  Thought Process:  Coherent and Goal Directed  Orientation:  Full (Time, Place, and Person)   Thought Content: Logical Perceptions: denies AH/VH  Suicidal Thoughts:  No  Homicidal Thoughts:  No  Memory:  Immediate;   Fair Recent;   Fair Remote;   Fair  Judgement:  Fair  Insight:  Shallow  Psychomotor Activity:  Normal  Concentration:  Concentration: Fair and Attention Span: Fair  Recall:  [employer] of Knowledge: Good  Language: Good  Akathisia:  No  Handed:  Right  AIMS (if indicated):  N/A  Assets:  Communication Skills Desire for Improvement  ADL's:  Intact  Cognition: WNL  Sleep:  good   Assessment Bonnie Rush is a 83 year old female with depression, anxiety, hypertension, hyperlipidemia, irritable bowel syndrome, left patella fracture s/p ORIF presented for mood symptoms.   #  MDD # Unspecified anxiety disorder Patient continues to endorse irritability, anxiety and some neurovegetative symptoms. Patient did not increase venlafaxine due to her misunderstanding; provided through psychoeducation and she agrees to increase the dose. Discussed risks including hypertension.  Continue clonazepam at the current dose. Provided supportive therapy for demoralization and patient is encouraged for behavioral activation.   Plan 1. Increase venlafaxine 187.5 mg daily (150 mg + 37.5 mg)  2. Continue clonazepam 0.5 mg daily and 1 mg at night 3. Return to clinic in January  Treatment Plan Summary: Plan as above  The patient demonstrates the following  risk factors for suicide: Chronic risk factors for suicide include psychiatric disorder,  demographic factors ( >35 yo). Acute risk factors for suicide include none. Protective factors for this patient include positive social support, hope for the future. Considering these factors, the overall suicide risk at this point appears to be low.   [name], MD 10/07/2016, 8:59 AM

## 2016-10-08 ENCOUNTER — Encounter (HOSPITAL_COMMUNITY): Payer: Self-pay

## 2016-10-08 ENCOUNTER — Ambulatory Visit (HOSPITAL_COMMUNITY): Payer: Medicare Other | Admitting: Psychiatry

## 2016-10-11 NOTE — Progress Notes (Signed)
BH MD/PA/NP OP Progress Note  10/15/2016 2:36 PM Bonnie Rush  MRN:  [id number]  Chief Complaint:  Chief Complaint    Follow-up; Depression     Subjective:  "I feel dizzy" HPI:  Patient presents for follow up appointment. She was brought by her son who she does not live with.   Patient states her mood is good. She reports that she did not pass driving test. She has occasional irritability, anxiety and wants to have more clonazepam. She reports insomnia. She reports passive SI. She had one time of VH of seeing people coming into her house; denies any episode more than a month.   Visit Diagnosis:    ICD-9-CM ICD-10-CM   1. Moderate episode of recurrent major depressive disorder (HCC) 296.32 F33.1     Past Psychiatric History:  She has seen a psychiatrist years ago. She has no known psychiatry admission. Past trials of medication: Venlafaxine, clonazepam, Xanax (AH, VH, paranoia), Trazodone (palpitation)   Past Medical History:  Past Medical History:  Diagnosis Date  . Glaucoma   . High cholesterol   . Hypertension   . Irritable bowel syndrome   . Osteoporosis   . Palpitations     Past Surgical History:  Procedure Laterality Date  . ABDOMINAL HYSTERECTOMY    . BREAST SURGERY    . CHOLECYSTECTOMY    . EYE SURGERY    . ORIF PATELLA Left 01/19/2016   Procedure: OPEN REDUCTION INTERNAL (ORIF) FIXATION LEFT PATELLA  FRACTURE;  Surgeon: Vickki Hearing, MD;  Location: [facility];  Service: Orthopedics;  Laterality: Left;    Family Psychiatric History:  She has seen a psychiatrist years ago. She has no known psychiatry admission. Past trials of medication: Venlafaxine, clonazepam, Xanax (AH, VH, paranoia), Trazodone (palpitation)   Family History: No family history on file.  Social History:  Social History   Social History  . Marital status: Widowed    Spouse name: N/A  . Number of children: N/A  . Years of education: N/A   Social History Main Topics  . Smoking  status: Never Smoker  . Smokeless tobacco: Never Used  . Alcohol use No  . Drug use: No  . Sexual activity: Not Asked   Other Topics Concern  . None   Social History Narrative  . None    Allergies:  Allergies  Allergen Reactions  . Penicillins     Unknown-childhood allergy    Metabolic Disorder Labs: No results found for: HGBA1C, MPG No results found for: PROLACTIN No results found for: CHOL, TRIG, HDL, CHOLHDL, VLDL, LDLCALC   Current Medications: Current Outpatient Prescriptions  Medication Sig Dispense Refill  . atorvastatin (LIPITOR) 20 MG tablet Take 20 mg by mouth daily.    . Calcium Carb-Cholecalciferol (CALCIUM 600 + D PO) Take 1 tablet by mouth daily.    . clonazePAM (KLONOPIN) 1 MG tablet Take 0.5 mg daily and 1 mg at night 45 tablet 0  . dexlansoprazole (DEXILANT) 60 MG capsule Take 60 mg by mouth daily.    . fexofenadine (ALLEGRA) 180 MG tablet Take 180 mg by mouth daily.    . hydrochlorothiazide (HYDRODIURIL) 25 MG tablet Take 25 mg by mouth daily.    Marland Kitchen loperamide (IMODIUM) 2 MG capsule Take 2 mg by mouth as needed for diarrhea or loose stools.    . meclizine (ANTIVERT) 25 MG tablet Take 25 mg by mouth 3 (three) times daily as needed for dizziness.    . promethazine (PHENERGAN) 25 MG tablet  Take 25 mg by mouth every 6 (six) hours as needed for nausea or vomiting.    . propranolol (INDERAL) 60 MG tablet Take 60 mg by mouth 2 (two) times daily.    Marland Kitchen. venlafaxine XR (EFFEXOR-XR) 150 MG 24 hr capsule Take 1 capsule (150 mg total) by mouth daily with breakfast. 30 capsule 1  . alendronate (FOSAMAX) 70 MG tablet Take 70 mg by mouth once a week. Take with a full glass of water on an empty stomach.Takes on Monday    . diphenoxylate-atropine (LOMOTIL) 2.5-0.025 MG tablet Take 1 tablet by mouth 4 (four) times daily as needed for diarrhea or loose stools.    . docusate sodium (COLACE) 100 MG capsule Take 1 capsule (100 mg total) by mouth 2 (two) times daily as needed for  mild constipation. 20 capsule 0  . Fluticasone-Salmeterol (ADVAIR) 250-50 MCG/DOSE AEPB Inhale 1 puff into the lungs daily.    Marland Kitchen. gabapentin (NEURONTIN) 100 MG capsule Take 100 mg by mouth 2 (two) times daily.    Marland Kitchen. HYDROcodone-acetaminophen (NORCO/VICODIN) 5-325 MG tablet Take 1 tablet by mouth 2 (two) times daily.    Marland Kitchen. latanoprost (XALATAN) 0.005 % ophthalmic solution Place 1 drop into both eyes at bedtime.    . meclizine (ANTIVERT) 25 MG tablet Take 25 mg by mouth 3 (three) times daily as needed for dizziness.    . timolol (BETIMOL) 0.5 % ophthalmic solution Place 1 drop into the right eye daily.    Marland Kitchen. triamcinolone cream (KENALOG) 0.1 % Apply 1 application topically daily.    Marland Kitchen. venlafaxine XR (EFFEXOR-XR) 37.5 MG 24 hr capsule Take 1 capsule (37.5 mg total) by mouth daily. Take this pill in addition to venlafaxine 150 mg (Total of 187.5mg  per day) 30 capsule 1   No current facility-administered medications for this visit.     Neurologic: Headache: No Seizure: No Paresthesias: No  Musculoskeletal: Strength & Muscle Tone: decreased Gait & Station: unsteady, uses a walker Patient leans: N/A  Psychiatric Specialty Exam: Review of Systems  Musculoskeletal: Positive for back pain, joint pain and neck pain.  Psychiatric/Behavioral: Positive for depression. Negative for hallucinations, substance abuse and suicidal ideas. The patient is nervous/anxious. The patient does not have insomnia.   All other systems reviewed and are negative.   Blood pressure (!) 153/84, pulse (!) 57, height 5' 0.5" (1.537 m), weight 150 lb (68 kg).Body mass index is 28.81 kg/m.  General Appearance: Fairly Groomed  Eye Contact:  Good  Speech:  Clear and Coherent  Volume:  Normal  Mood:  "good"  Affect:  Restricted- improving  Thought Process:  Coherent and Goal Directed  Orientation:  Full (Time, Place, and Person)  Thought Content: denies paranoia Perceptions: denies current AH/VH  Suicidal Thoughts:  No   Homicidal Thoughts:  No  Memory:  Immediate;   Fair Recent;   Fair Remote;   Fair  Judgement:  Fair  Insight:  Shallow  Psychomotor Activity:  Normal  Concentration:  Concentration: Fair and Attention Span: Fair  Recall:  [employer]  Fund of Knowledge: Good  Language: Good  Akathisia:  No  Handed:  Right  AIMS (if indicated):  N/A  Assets:  Communication Skills Desire for Improvement  ADL's:  Intact  Cognition: WNL  Sleep:  good   Assessment Bonnie Rush is a 83 year old female with depression, anxiety, hypertension, hyperlipidemia, irritable bowel syndrome, left patella fracture s/p ORIF presented for mood symptoms.   # MDD # Unspecified anxiety disorder Exam is  notable for her slightly brighten affect (appropriately), although patient continues to endorse mood symptoms. Will continue dose of Effexor. Will continue clonazepam at this time; discussed risk of dizziness, dependence, fall and oversedation. Patient is advised to come with her daughter she lives with, if the schedule allows at the next visit.   Plan 1. Continue venlafaxine 187.5 mg daily (150 mg + 37.5 mg)  2. Continue clonazepam 0.5 mg daily and 1 mg at night 3. Return to clinic in one month  Treatment Plan Summary: Plan as above  The patient demonstrates the following  risk factors for suicide: Chronic risk factors for suicide include psychiatric disorder,  demographic factors ( >58 yo). Acute risk factors for suicide include none. Protective factors for this patient include positive social support, hope for the future. Considering these factors, the overall suicide risk at this point appears to be low.   [name], MD 10/15/2016, 2:36 PM

## 2016-10-15 ENCOUNTER — Ambulatory Visit (INDEPENDENT_AMBULATORY_CARE_PROVIDER_SITE_OTHER): Payer: Medicare Other | Admitting: Psychiatry

## 2016-10-15 ENCOUNTER — Encounter (HOSPITAL_COMMUNITY): Payer: Self-pay | Admitting: Psychiatry

## 2016-10-15 VITALS — BP 153/84 | HR 57 | Ht 60.5 in | Wt 150.0 lb

## 2016-10-15 DIAGNOSIS — Z880 Allergy status to penicillin: Secondary | ICD-10-CM

## 2016-10-15 DIAGNOSIS — F331 Major depressive disorder, recurrent, moderate: Secondary | ICD-10-CM

## 2016-10-15 DIAGNOSIS — Z79899 Other long term (current) drug therapy: Secondary | ICD-10-CM | POA: Diagnosis not present

## 2016-10-15 MED ORDER — CLONAZEPAM 1 MG PO TABS: ORAL_TABLET | ORAL | 0 refills | Status: DC

## 2016-10-15 MED ORDER — VENLAFAXINE HCL ER 37.5 MG PO CP24: 37.5000 mg | ORAL_CAPSULE | Freq: Every day | ORAL | 1 refills | Status: DC

## 2016-10-15 MED ORDER — VENLAFAXINE HCL ER 150 MG PO CP24: 150.0000 mg | ORAL_CAPSULE | Freq: Every day | ORAL | 1 refills | Status: DC

## 2016-10-15 NOTE — Patient Instructions (Signed)
1. Continue venlafaxine 187.5 mg daily (150 mg + 37.5 mg)  2. Continue clonazepam 0.5 mg daily and 1 mg at night 3. Return to clinic in one month

## 2016-10-16 DIAGNOSIS — J449 Chronic obstructive pulmonary disease, unspecified: Secondary | ICD-10-CM | POA: Diagnosis not present

## 2016-10-16 DIAGNOSIS — M159 Polyosteoarthritis, unspecified: Secondary | ICD-10-CM | POA: Diagnosis not present

## 2016-10-16 DIAGNOSIS — I10 Essential (primary) hypertension: Secondary | ICD-10-CM | POA: Diagnosis not present

## 2016-10-21 DIAGNOSIS — G309 Alzheimer's disease, unspecified: Secondary | ICD-10-CM | POA: Diagnosis not present

## 2016-10-21 DIAGNOSIS — J449 Chronic obstructive pulmonary disease, unspecified: Secondary | ICD-10-CM | POA: Diagnosis not present

## 2016-10-21 DIAGNOSIS — K589 Irritable bowel syndrome without diarrhea: Secondary | ICD-10-CM | POA: Diagnosis not present

## 2016-10-21 DIAGNOSIS — I10 Essential (primary) hypertension: Secondary | ICD-10-CM | POA: Diagnosis not present

## 2016-10-21 DIAGNOSIS — Z87891 Personal history of nicotine dependence: Secondary | ICD-10-CM | POA: Diagnosis not present

## 2016-10-21 DIAGNOSIS — Z299 Encounter for prophylactic measures, unspecified: Secondary | ICD-10-CM | POA: Diagnosis not present

## 2016-10-21 DIAGNOSIS — Z6829 Body mass index (BMI) 29.0-29.9, adult: Secondary | ICD-10-CM | POA: Diagnosis not present

## 2016-10-21 DIAGNOSIS — Z713 Dietary counseling and surveillance: Secondary | ICD-10-CM | POA: Diagnosis not present

## 2016-11-07 NOTE — Progress Notes (Signed)
BH MD/PA/NP OP Progress Note  11/12/2016 1:58 PM Bonnie Rush  MRN:  [id number]  Chief Complaint:  Chief Complaint    Depression; Anxiety; Follow-up     Subjective:  "I am doing better." HPI:  Patient presents for follow up appointment with her daughter.   She reports that she has been doing good, although she hopes to increase the dose of clonazepam. She enjoys reading newspaper. She hopes to walk outside when the weather gets better. She denies irritability.  She has insomnia at night. She denies SI. She complains of dizziness/vertigo which she has for many years. She denies AH/VH.   Per patient daughter, Savanha demands her clonazepam to be increased for anxiety. Otherwise, she is doing well overall. Patient did not get a chance to increase her Effexor. No safety issues.   Visit Diagnosis:    ICD-9-CM ICD-10-CM   1. Moderate episode of recurrent major depressive disorder (HCC) 296.32 F33.1     Past Psychiatric History:  She has seen a psychiatrist years ago. She has no known psychiatry admission. Past trials of medication: Venlafaxine, clonazepam, Xanax (AH, VH, paranoia), Trazodone (palpitation)   Past Medical History:  Past Medical History:  Diagnosis Date  . Glaucoma   . High cholesterol   . Hypertension   . Irritable bowel syndrome   . Osteoporosis   . Palpitations     Past Surgical History:  Procedure Laterality Date  . ABDOMINAL HYSTERECTOMY    . BREAST SURGERY    . CHOLECYSTECTOMY    . EYE SURGERY    . ORIF PATELLA Left 01/19/2016   Procedure: OPEN REDUCTION INTERNAL (ORIF) FIXATION LEFT PATELLA  FRACTURE;  Surgeon: Vickki Hearing, MD;  Location: [facility];  Service: Orthopedics;  Laterality: Left;    Family Psychiatric History:  She has seen a psychiatrist years ago. She has no known psychiatry admission. Past trials of medication: Venlafaxine, clonazepam, Xanax (AH, VH, paranoia), Trazodone (palpitation)   Family History: No family history on  file.  Social History:  Social History   Social History  . Marital status: Widowed    Spouse name: N/A  . Number of children: N/A  . Years of education: N/A   Social History Main Topics  . Smoking status: Never Smoker  . Smokeless tobacco: Never Used  . Alcohol use No  . Drug use: No  . Sexual activity: Not Asked   Other Topics Concern  . None   Social History Narrative  . None    Allergies:  Allergies  Allergen Reactions  . Penicillins     Unknown-childhood allergy    Metabolic Disorder Labs: No results found for: HGBA1C, MPG No results found for: PROLACTIN No results found for: CHOL, TRIG, HDL, CHOLHDL, VLDL, LDLCALC   Current Medications: Current Outpatient Prescriptions  Medication Sig Dispense Refill  . alendronate (FOSAMAX) 70 MG tablet Take 70 mg by mouth once a week. Take with a full glass of water on an empty stomach.Takes on Monday    . atorvastatin (LIPITOR) 20 MG tablet Take 20 mg by mouth daily.    . Calcium Carb-Cholecalciferol (CALCIUM 600 + D PO) Take 1 tablet by mouth daily.    . clonazePAM (KLONOPIN) 1 MG tablet Take 1 mg daily and 0.5 mg at night 90 tablet 0  . dexlansoprazole (DEXILANT) 60 MG capsule Take 60 mg by mouth daily.    . diphenoxylate-atropine (LOMOTIL) 2.5-0.025 MG tablet Take 1 tablet by mouth 4 (four) times daily as needed for diarrhea or  loose stools.    . docusate sodium (COLACE) 100 MG capsule Take 1 capsule (100 mg total) by mouth 2 (two) times daily as needed for mild constipation. 20 capsule 0  . fexofenadine (ALLEGRA) 180 MG tablet Take 180 mg by mouth daily.    . Fluticasone-Salmeterol (ADVAIR) 250-50 MCG/DOSE AEPB Inhale 1 puff into the lungs daily.    Marland Kitchen gabapentin (NEURONTIN) 100 MG capsule Take 100 mg by mouth 2 (two) times daily.    . hydrochlorothiazide (HYDRODIURIL) 25 MG tablet Take 25 mg by mouth daily.    Marland Kitchen HYDROcodone-acetaminophen (NORCO/VICODIN) 5-325 MG tablet Take 1 tablet by mouth 2 (two) times daily.    Marland Kitchen  latanoprost (XALATAN) 0.005 % ophthalmic solution Place 1 drop into both eyes at bedtime.    Marland Kitchen loperamide (IMODIUM) 2 MG capsule Take 2 mg by mouth as needed for diarrhea or loose stools.    . meclizine (ANTIVERT) 25 MG tablet Take 25 mg by mouth 3 (three) times daily as needed for dizziness.    . promethazine (PHENERGAN) 25 MG tablet Take 25 mg by mouth every 6 (six) hours as needed for nausea or vomiting.    . propranolol (INDERAL) 60 MG tablet Take 60 mg by mouth 2 (two) times daily.    . timolol (BETIMOL) 0.5 % ophthalmic solution Place 1 drop into the right eye daily.    Marland Kitchen triamcinolone cream (KENALOG) 0.1 % Apply 1 application topically daily.    Marland Kitchen venlafaxine XR (EFFEXOR-XR) 150 MG 24 hr capsule Take 1 capsule (150 mg total) by mouth daily with breakfast. 60 capsule 0   No current facility-administered medications for this visit.     Neurologic: Headache: No Seizure: No Paresthesias: No  Musculoskeletal: Strength & Muscle Tone: decreased Gait & Station: unsteady, uses a walker Patient leans: N/A  Psychiatric Specialty Exam: Review of Systems  Musculoskeletal: Positive for back pain, joint pain and neck pain.  Psychiatric/Behavioral: Positive for depression. Negative for hallucinations, substance abuse and suicidal ideas. The patient is nervous/anxious. The patient does not have insomnia.   All other systems reviewed and are negative.   Blood pressure (!) 156/70, pulse 60, height 5' 0.5" (1.537 m), weight 150 lb 12.8 oz (68.4 kg), SpO2 91 %.Body mass index is 28.97 kg/m.  General Appearance: Fairly Groomed  Eye Contact:  Good  Speech:  Clear and Coherent  Volume:  Normal  Mood:  "better"  Affect:  Restricted- improving  Thought Process:  Coherent and Goal Directed  Orientation:  Full (Time, Place, and Person)  Thought Content: denies paranoia Perceptions: denies current AH/VH  Suicidal Thoughts:  No  Homicidal Thoughts:  No  Memory:  Immediate;   Fair Recent;    Fair Remote;   Fair  Judgement:  Fair  Insight:  Shallow  Psychomotor Activity:  Normal  Concentration:  Concentration: Fair and Attention Span: Fair  Recall:  [employer] of Knowledge: Good  Language: Good  Akathisia:  No  Handed:  Right  AIMS (if indicated):  N/A  Assets:  Communication Skills Desire for Improvement  ADL's:  Intact  Cognition: WNL  Sleep:  poor   Assessment TAMETHA BANNING is a 83 year old female with depression, anxiety, hypertension, hyperlipidemia, irritable bowel syndrome, left patella fracture s/p ORIF presented for mood symptoms.   # MDD # Unspecified anxiety disorder There has been overall improvement in her mood symptoms since the initial encounter. Will hold to increase Effexor (patient never received additional dose for some reason) given  her improvement and to avoid unnecessary adverse reaction given her hypertension. Patient is advised to try hight dose of clonazepam in the morning. Discussed risk of dizziness, dependence, fall and oversedation.  Plan 1. Continue venlafaxine 150 mg daily- monitor BP 2. Continue clonazepam 1 mg during the day and 0.5 mg at night 3. Return to clinic in two months  Treatment Plan Summary: Plan as above  The patient demonstrates the following  risk factors for suicide: Chronic risk factors for suicide include psychiatric disorder,  demographic factors 9( >60 yo). Acute risk factors for suicide include none. Protective factors for this patient include positive social support, hope for the future. Considering these factors, the overall suicide risk at this point appears to be low.   Neysa Hottereina Eason Housman, MD 11/12/2016, 1:58 PM

## 2016-11-12 ENCOUNTER — Ambulatory Visit (INDEPENDENT_AMBULATORY_CARE_PROVIDER_SITE_OTHER): Payer: Medicare Other | Admitting: Psychiatry

## 2016-11-12 ENCOUNTER — Encounter (HOSPITAL_COMMUNITY): Payer: Self-pay | Admitting: Psychiatry

## 2016-11-12 VITALS — BP 156/70 | HR 60 | Ht 60.5 in | Wt 150.8 lb

## 2016-11-12 DIAGNOSIS — E785 Hyperlipidemia, unspecified: Secondary | ICD-10-CM | POA: Diagnosis not present

## 2016-11-12 DIAGNOSIS — Z9049 Acquired absence of other specified parts of digestive tract: Secondary | ICD-10-CM

## 2016-11-12 DIAGNOSIS — Z79899 Other long term (current) drug therapy: Secondary | ICD-10-CM

## 2016-11-12 DIAGNOSIS — F419 Anxiety disorder, unspecified: Secondary | ICD-10-CM | POA: Diagnosis not present

## 2016-11-12 DIAGNOSIS — Z90710 Acquired absence of both cervix and uterus: Secondary | ICD-10-CM

## 2016-11-12 DIAGNOSIS — F331 Major depressive disorder, recurrent, moderate: Secondary | ICD-10-CM | POA: Diagnosis not present

## 2016-11-12 DIAGNOSIS — I10 Essential (primary) hypertension: Secondary | ICD-10-CM

## 2016-11-12 DIAGNOSIS — Z880 Allergy status to penicillin: Secondary | ICD-10-CM

## 2016-11-12 DIAGNOSIS — Z9889 Other specified postprocedural states: Secondary | ICD-10-CM

## 2016-11-12 DIAGNOSIS — K589 Irritable bowel syndrome without diarrhea: Secondary | ICD-10-CM

## 2016-11-12 MED ORDER — VENLAFAXINE HCL ER 150 MG PO CP24: 150.0000 mg | ORAL_CAPSULE | Freq: Every day | ORAL | 0 refills | Status: DC

## 2016-11-12 MED ORDER — CLONAZEPAM 1 MG PO TABS: ORAL_TABLET | ORAL | 0 refills | Status: DC

## 2016-11-12 NOTE — Patient Instructions (Addendum)
1. Continue venlafaxine 150 mg daily 2. Continue clonazepam 1 mg during the day and 0.5 mg at night 3. Return to clinic in two months

## 2016-11-20 DIAGNOSIS — Z79899 Other long term (current) drug therapy: Secondary | ICD-10-CM | POA: Diagnosis not present

## 2016-11-20 DIAGNOSIS — G309 Alzheimer's disease, unspecified: Secondary | ICD-10-CM | POA: Diagnosis not present

## 2016-11-20 DIAGNOSIS — K589 Irritable bowel syndrome without diarrhea: Secondary | ICD-10-CM | POA: Diagnosis not present

## 2016-11-20 DIAGNOSIS — Z1389 Encounter for screening for other disorder: Secondary | ICD-10-CM | POA: Diagnosis not present

## 2016-11-20 DIAGNOSIS — E7800 Pure hypercholesterolemia, unspecified: Secondary | ICD-10-CM | POA: Diagnosis not present

## 2016-11-20 DIAGNOSIS — Z0000 Encounter for general adult medical examination without abnormal findings: Secondary | ICD-10-CM | POA: Diagnosis not present

## 2016-11-20 DIAGNOSIS — J449 Chronic obstructive pulmonary disease, unspecified: Secondary | ICD-10-CM | POA: Diagnosis not present

## 2016-11-20 DIAGNOSIS — F419 Anxiety disorder, unspecified: Secondary | ICD-10-CM | POA: Diagnosis not present

## 2016-11-20 DIAGNOSIS — Z7189 Other specified counseling: Secondary | ICD-10-CM | POA: Diagnosis not present

## 2016-11-20 DIAGNOSIS — Z299 Encounter for prophylactic measures, unspecified: Secondary | ICD-10-CM | POA: Diagnosis not present

## 2016-11-20 DIAGNOSIS — Z1211 Encounter for screening for malignant neoplasm of colon: Secondary | ICD-10-CM | POA: Diagnosis not present

## 2016-11-20 DIAGNOSIS — R5383 Other fatigue: Secondary | ICD-10-CM | POA: Diagnosis not present

## 2016-11-20 DIAGNOSIS — Z6829 Body mass index (BMI) 29.0-29.9, adult: Secondary | ICD-10-CM | POA: Diagnosis not present

## 2016-11-20 DIAGNOSIS — I10 Essential (primary) hypertension: Secondary | ICD-10-CM | POA: Diagnosis not present

## 2016-11-20 DIAGNOSIS — K769 Liver disease, unspecified: Secondary | ICD-10-CM | POA: Diagnosis not present

## 2016-12-19 DIAGNOSIS — M159 Polyosteoarthritis, unspecified: Secondary | ICD-10-CM | POA: Diagnosis not present

## 2016-12-19 DIAGNOSIS — I10 Essential (primary) hypertension: Secondary | ICD-10-CM | POA: Diagnosis not present

## 2016-12-19 DIAGNOSIS — J449 Chronic obstructive pulmonary disease, unspecified: Secondary | ICD-10-CM | POA: Diagnosis not present

## 2017-01-08 DIAGNOSIS — K219 Gastro-esophageal reflux disease without esophagitis: Secondary | ICD-10-CM | POA: Diagnosis not present

## 2017-01-08 DIAGNOSIS — Z87891 Personal history of nicotine dependence: Secondary | ICD-10-CM | POA: Diagnosis not present

## 2017-01-08 DIAGNOSIS — F0280 Dementia in other diseases classified elsewhere without behavioral disturbance: Secondary | ICD-10-CM | POA: Diagnosis not present

## 2017-01-08 DIAGNOSIS — Z299 Encounter for prophylactic measures, unspecified: Secondary | ICD-10-CM | POA: Diagnosis not present

## 2017-01-08 DIAGNOSIS — F419 Anxiety disorder, unspecified: Secondary | ICD-10-CM | POA: Diagnosis not present

## 2017-01-08 DIAGNOSIS — J449 Chronic obstructive pulmonary disease, unspecified: Secondary | ICD-10-CM | POA: Diagnosis not present

## 2017-01-08 DIAGNOSIS — K769 Liver disease, unspecified: Secondary | ICD-10-CM | POA: Diagnosis not present

## 2017-01-08 DIAGNOSIS — Z6828 Body mass index (BMI) 28.0-28.9, adult: Secondary | ICD-10-CM | POA: Diagnosis not present

## 2017-01-08 DIAGNOSIS — I10 Essential (primary) hypertension: Secondary | ICD-10-CM | POA: Diagnosis not present

## 2017-01-08 DIAGNOSIS — G309 Alzheimer's disease, unspecified: Secondary | ICD-10-CM | POA: Diagnosis not present

## 2017-01-08 DIAGNOSIS — R350 Frequency of micturition: Secondary | ICD-10-CM | POA: Diagnosis not present

## 2017-01-08 DIAGNOSIS — Q613 Polycystic kidney, unspecified: Secondary | ICD-10-CM | POA: Diagnosis not present

## 2017-01-10 ENCOUNTER — Ambulatory Visit (INDEPENDENT_AMBULATORY_CARE_PROVIDER_SITE_OTHER): Payer: Medicare Other | Admitting: Psychiatry

## 2017-01-10 ENCOUNTER — Encounter (HOSPITAL_COMMUNITY): Payer: Self-pay | Admitting: Psychiatry

## 2017-01-10 VITALS — BP 190/100 | HR 60 | Ht 60.5 in | Wt 150.2 lb

## 2017-01-10 DIAGNOSIS — R350 Frequency of micturition: Secondary | ICD-10-CM | POA: Diagnosis not present

## 2017-01-10 DIAGNOSIS — I10 Essential (primary) hypertension: Secondary | ICD-10-CM

## 2017-01-10 DIAGNOSIS — Z79899 Other long term (current) drug therapy: Secondary | ICD-10-CM

## 2017-01-10 DIAGNOSIS — Z6828 Body mass index (BMI) 28.0-28.9, adult: Secondary | ICD-10-CM | POA: Diagnosis not present

## 2017-01-10 DIAGNOSIS — F331 Major depressive disorder, recurrent, moderate: Secondary | ICD-10-CM | POA: Diagnosis not present

## 2017-01-10 DIAGNOSIS — Z299 Encounter for prophylactic measures, unspecified: Secondary | ICD-10-CM | POA: Diagnosis not present

## 2017-01-10 DIAGNOSIS — Z713 Dietary counseling and surveillance: Secondary | ICD-10-CM | POA: Diagnosis not present

## 2017-01-10 MED ORDER — VENLAFAXINE HCL ER 150 MG PO CP24: 150.0000 mg | ORAL_CAPSULE | Freq: Every day | ORAL | 0 refills | Status: DC

## 2017-01-10 MED ORDER — CLONAZEPAM 1 MG PO TABS: ORAL_TABLET | ORAL | 0 refills | Status: DC

## 2017-01-10 NOTE — Progress Notes (Addendum)
BH MD/PA/NP OP Progress Note  01/10/2017 11:42 AM Bonnie Rush  MRN:  [id number]  Chief Complaint:  Chief Complaint    Follow-up; Depression     Subjective:  "I still feels nervous." HPI:  Patient presents for follow up appointment. She is doing well. She complains of her vision, which she will see ophthalmologist. She reports that she feels anxious at times and quit going to church due to IBS. She reports good motivation if her GI symptoms resolves. She wants her clonazepam to be increased at night. She feels depressed at times. She denies SI. She denies AH/VH. Of note, she states that her BP was "fine" when she saw a doctor a few days ago. She denies headache, weakness or chest pain. She has chronic vertigo.   Her daughter was not available on the visit today.   Visit Diagnosis:    ICD-9-CM ICD-10-CM   1. Moderate episode of recurrent major depressive disorder (HCC) 296.32 F33.1     Past Psychiatric History:  She has seen a psychiatrist years ago. She has no known psychiatry admission. Past trials of medication: Venlafaxine, clonazepam, Xanax (AH, VH, paranoia), Trazodone (palpitation)   Past Medical History:  Past Medical History:  Diagnosis Date  . Glaucoma   . High cholesterol   . Hypertension   . Irritable bowel syndrome   . Osteoporosis   . Palpitations     Past Surgical History:  Procedure Laterality Date  . ABDOMINAL HYSTERECTOMY    . BREAST SURGERY    . CHOLECYSTECTOMY    . EYE SURGERY    . ORIF PATELLA Left 01/19/2016   Procedure: OPEN REDUCTION INTERNAL (ORIF) FIXATION LEFT PATELLA  FRACTURE;  Surgeon: Vickki Hearing, MD;  Location: [facility];  Service: Orthopedics;  Laterality: Left;    Family Psychiatric History:  She has seen a psychiatrist years ago. She has no known psychiatry admission. Past trials of medication: Venlafaxine, clonazepam, Xanax (AH, VH, paranoia), Trazodone (palpitation)   Family History: No family history on file.  Social  History:  Social History   Social History  . Marital status: Widowed    Spouse name: N/A  . Number of children: N/A  . Years of education: N/A   Social History Main Topics  . Smoking status: Never Smoker  . Smokeless tobacco: Never Used  . Alcohol use No  . Drug use: No  . Sexual activity: Not Asked   Other Topics Concern  . None   Social History Narrative  . None    Allergies:  Allergies  Allergen Reactions  . Penicillins     Unknown-childhood allergy    Metabolic Disorder Labs: No results found for: HGBA1C, MPG No results found for: PROLACTIN No results found for: CHOL, TRIG, HDL, CHOLHDL, VLDL, LDLCALC   Current Medications: Current Outpatient Prescriptions  Medication Sig Dispense Refill  . alendronate (FOSAMAX) 70 MG tablet Take 70 mg by mouth once a week. Take with a full glass of water on an empty stomach.Takes on Monday    . atorvastatin (LIPITOR) 20 MG tablet Take 20 mg by mouth daily.    . Calcium Carb-Cholecalciferol (CALCIUM 600 + D PO) Take 1 tablet by mouth daily.    . clonazePAM (KLONOPIN) 1 MG tablet Take 1 mg daily and 0.5 mg at night 90 tablet 0  . dexlansoprazole (DEXILANT) 60 MG capsule Take 60 mg by mouth daily.    . diphenoxylate-atropine (LOMOTIL) 2.5-0.025 MG tablet Take 1 tablet by mouth 4 (four) times daily as  needed for diarrhea or loose stools.    . docusate sodium (COLACE) 100 MG capsule Take 1 capsule (100 mg total) by mouth 2 (two) times daily as needed for mild constipation. 20 capsule 0  . fexofenadine (ALLEGRA) 180 MG tablet Take 180 mg by mouth daily.    . Fluticasone-Salmeterol (ADVAIR) 250-50 MCG/DOSE AEPB Inhale 1 puff into the lungs daily.    Marland Kitchen gabapentin (NEURONTIN) 100 MG capsule Take 100 mg by mouth 2 (two) times daily.    . hydrochlorothiazide (HYDRODIURIL) 25 MG tablet Take 25 mg by mouth daily.    Marland Kitchen HYDROcodone-acetaminophen (NORCO/VICODIN) 5-325 MG tablet Take 1 tablet by mouth 2 (two) times daily.    Marland Kitchen latanoprost  (XALATAN) 0.005 % ophthalmic solution Place 1 drop into both eyes at bedtime.    Marland Kitchen loperamide (IMODIUM) 2 MG capsule Take 2 mg by mouth as needed for diarrhea or loose stools.    . meclizine (ANTIVERT) 25 MG tablet Take 25 mg by mouth 3 (three) times daily as needed for dizziness.    . promethazine (PHENERGAN) 25 MG tablet Take 25 mg by mouth every 6 (six) hours as needed for nausea or vomiting.    . propranolol (INDERAL) 60 MG tablet Take 60 mg by mouth 2 (two) times daily.    . timolol (BETIMOL) 0.5 % ophthalmic solution Place 1 drop into the right eye daily.    Marland Kitchen triamcinolone cream (KENALOG) 0.1 % Apply 1 application topically daily.    Marland Kitchen venlafaxine XR (EFFEXOR-XR) 150 MG 24 hr capsule Take 1 capsule (150 mg total) by mouth daily with breakfast. 60 capsule 0   No current facility-administered medications for this visit.     Neurologic: Headache: No Seizure: No Paresthesias: No  Musculoskeletal: Strength & Muscle Tone: decreased Gait & Station: unsteady, uses a walker Patient leans: N/A  Psychiatric Specialty Exam: Review of Systems  Musculoskeletal: Positive for back pain, joint pain and neck pain.  Neurological: Negative for dizziness and focal weakness.  Psychiatric/Behavioral: Positive for depression. Negative for hallucinations, substance abuse and suicidal ideas. The patient is nervous/anxious. The patient does not have insomnia.   All other systems reviewed and are negative.   Blood pressure (!) 190/100, pulse 60, height 5' 0.5" (1.537 m), weight 150 lb 3.2 oz (68.1 kg), SpO2 93 %.Body mass index is 28.85 kg/m.  General Appearance: Fairly Groomed  Eye Contact:  Good  Speech:  Clear and Coherent  Volume:  Normal  Mood:  "better"  Affect:  Restricted- improving  Thought Process:  Coherent and Goal Directed  Orientation:  Full (Time, Place, and Person)  Thought Content: denies paranoia Perceptions: denies current AH/VH  Suicidal Thoughts:  No  Homicidal Thoughts:  No   Memory:  Immediate;   Fair Recent;   Fair Remote;   Fair  Judgement:  Fair  Insight:  Shallow  Psychomotor Activity:  Normal  Concentration:  Concentration: Fair and Attention Span: Fair  Recall:  [employer] of Knowledge: Good  Language: Good  Akathisia:  No  Handed:  Right  AIMS (if indicated):  N/A  Assets:  Communication Skills Desire for Improvement  ADL's:  Intact  Cognition: WNL  Sleep:  fair   Assessment Bonnie Rush is a 83 year old female with depression, anxiety, hypertension, hyperlipidemia, irritable bowel syndrome, left patella fracture s/p ORIF who presents for follow up appointment for depression.   # MDD # Unspecified anxiety disorder Although patient complains of occasional anxiety, her mood has been improved  overall since the initial encounter. Will continue current medication. Continue clonazepam for anxiety. Discussed risk of dizziness, dependence, fall and oversedation.   # Hypertension Today's BP is markedly high. Patient is not in acute distress and she denies any acute symptoms. She is advised to contact her PCP for follow up. Noted that although Effexor can raise BP, her BP has been lower on current regimen. Will continue the same dose at this time.  Plan 1. Continue venlafaxine 150 mg daily 2. Continue clonazepam 1 mg during the day and 0.5 mg at night 3. Return to clinic in two months 4. Patient to contact her PCP for hypertension  Treatment Plan Summary: Plan as above  The patient demonstrates the following  risk factors for suicide: Chronic risk factors for suicide include psychiatric disorder,  demographic factors ( >80 yo). Acute risk factors for suicide include none. Protective factors for this patient include positive social support, hope for the future. Considering these factors, the overall suicide risk at this point appears to be low.   [name], MD 01/10/2017, 11:42 AM

## 2017-01-10 NOTE — Patient Instructions (Addendum)
1. Continue venlafaxine 150 mg daily 2. Continue clonazepam 1 mg during the day and 0.5 mg at night 3. Return to clinic in two months 4. Please contact your primary care doctor for your hypertension

## 2017-01-20 DIAGNOSIS — Z970 Presence of artificial eye: Secondary | ICD-10-CM | POA: Diagnosis not present

## 2017-01-20 DIAGNOSIS — Z961 Presence of intraocular lens: Secondary | ICD-10-CM | POA: Diagnosis not present

## 2017-01-20 DIAGNOSIS — H35371 Puckering of macula, right eye: Secondary | ICD-10-CM | POA: Diagnosis not present

## 2017-01-20 DIAGNOSIS — H43811 Vitreous degeneration, right eye: Secondary | ICD-10-CM | POA: Diagnosis not present

## 2017-01-20 DIAGNOSIS — H401112 Primary open-angle glaucoma, right eye, moderate stage: Secondary | ICD-10-CM | POA: Diagnosis not present

## 2017-02-13 DIAGNOSIS — M159 Polyosteoarthritis, unspecified: Secondary | ICD-10-CM | POA: Diagnosis not present

## 2017-02-13 DIAGNOSIS — J449 Chronic obstructive pulmonary disease, unspecified: Secondary | ICD-10-CM | POA: Diagnosis not present

## 2017-02-13 DIAGNOSIS — I10 Essential (primary) hypertension: Secondary | ICD-10-CM | POA: Diagnosis not present

## 2017-02-26 DIAGNOSIS — I10 Essential (primary) hypertension: Secondary | ICD-10-CM | POA: Diagnosis not present

## 2017-02-26 DIAGNOSIS — K769 Liver disease, unspecified: Secondary | ICD-10-CM | POA: Diagnosis not present

## 2017-02-26 DIAGNOSIS — Q613 Polycystic kidney, unspecified: Secondary | ICD-10-CM | POA: Diagnosis not present

## 2017-02-26 DIAGNOSIS — F0280 Dementia in other diseases classified elsewhere without behavioral disturbance: Secondary | ICD-10-CM | POA: Diagnosis not present

## 2017-02-26 DIAGNOSIS — J449 Chronic obstructive pulmonary disease, unspecified: Secondary | ICD-10-CM | POA: Diagnosis not present

## 2017-02-26 DIAGNOSIS — K589 Irritable bowel syndrome without diarrhea: Secondary | ICD-10-CM | POA: Diagnosis not present

## 2017-02-26 DIAGNOSIS — G309 Alzheimer's disease, unspecified: Secondary | ICD-10-CM | POA: Diagnosis not present

## 2017-02-26 DIAGNOSIS — R32 Unspecified urinary incontinence: Secondary | ICD-10-CM | POA: Diagnosis not present

## 2017-02-26 DIAGNOSIS — F419 Anxiety disorder, unspecified: Secondary | ICD-10-CM | POA: Diagnosis not present

## 2017-02-26 DIAGNOSIS — Z299 Encounter for prophylactic measures, unspecified: Secondary | ICD-10-CM | POA: Diagnosis not present

## 2017-02-26 DIAGNOSIS — Z6828 Body mass index (BMI) 28.0-28.9, adult: Secondary | ICD-10-CM | POA: Diagnosis not present

## 2017-02-26 DIAGNOSIS — L740 Miliaria rubra: Secondary | ICD-10-CM | POA: Diagnosis not present

## 2017-03-06 NOTE — Progress Notes (Signed)
BH MD/PA/NP OP Progress Note  03/12/2017 12:44 PM VALITA RIGHTER  MRN:  [id number]  Chief Complaint:  Chief Complaint    Follow-up; Depression     Subjective:  "I'm depressed." HPI:  Patient presents for follow up appointment. She states that she is not doing good, stating "lot going on." She has hearing loss and has not been able to see ENT. She is unable to go outside due to her fear of fall. She talks about her son, who lives with her 4-5 years, who helps her for cooking. She vaguely reports stress, stating that "thing you would have living together." She feels frustrated that she cannot take clonazepam more than instructed. She enjoy watching TV. She spends time doing house chores. She is not interested in going to senior center, as she is concerned that she may have some GI symptoms. She reports insomnia. She feels fatigue. She denies SI, AH/VH. She feels anxious and has panic attack once very three week.   Her daughter presents to the interview.  There has been no change since the last appointment. She appears to be more frustrated as she cannot take clonazepam as she wishes. No safety concern.   Per [employer] Clonazepam, last filled in 4/20, for 45 days   Visit Diagnosis:    ICD-10-CM   1. Moderate episode of recurrent major depressive disorder (HCC) F33.1     Past Psychiatric History:  She has seen a psychiatrist years ago. She has no known psychiatry admission. Past trials of medication: Venlafaxine, clonazepam, Xanax (AH, VH, paranoia), Trazodone (palpitation)   Past Medical History:  Past Medical History:  Diagnosis Date  . Glaucoma   . High cholesterol   . Hypertension   . Irritable bowel syndrome   . Osteoporosis   . Palpitations     Past Surgical History:  Procedure Laterality Date  . ABDOMINAL HYSTERECTOMY    . BREAST SURGERY    . CHOLECYSTECTOMY    . EYE SURGERY    . ORIF PATELLA Left 01/19/2016   Procedure: OPEN REDUCTION INTERNAL (ORIF) FIXATION LEFT  PATELLA  FRACTURE;  Surgeon: Vickki Hearing, MD;  Location: [facility];  Service: Orthopedics;  Laterality: Left;    Family Psychiatric History:  She has seen a psychiatrist years ago. She has no known psychiatry admission. Past trials of medication: Venlafaxine, clonazepam, Xanax (AH, VH, paranoia), Trazodone (palpitation)   Family History: No family history on file.  Social History:  Social History   Social History  . Marital status: Widowed    Spouse name: N/A  . Number of children: N/A  . Years of education: N/A   Social History Main Topics  . Smoking status: Never Smoker  . Smokeless tobacco: Never Used  . Alcohol use No  . Drug use: No  . Sexual activity: Not Asked   Other Topics Concern  . None   Social History Narrative  . None    Allergies:  Allergies  Allergen Reactions  . Penicillins     Unknown-childhood allergy    Metabolic Disorder Labs: No results found for: HGBA1C, MPG No results found for: PROLACTIN No results found for: CHOL, TRIG, HDL, CHOLHDL, VLDL, LDLCALC   Current Medications: Current Outpatient Prescriptions  Medication Sig Dispense Refill  . alendronate (FOSAMAX) 70 MG tablet Take 70 mg by mouth once a week. Take with a full glass of water on an empty stomach.Takes on Monday    . atorvastatin (LIPITOR) 20 MG tablet Take 20 mg by mouth  daily.    . Calcium Carb-Cholecalciferol (CALCIUM 600 + D PO) Take 1 tablet by mouth daily.    . clonazePAM (KLONOPIN) 1 MG tablet Take 1 mg daily and 0.5 mg at night 135 tablet 0  . dexlansoprazole (DEXILANT) 60 MG capsule Take 60 mg by mouth daily.    . diphenoxylate-atropine (LOMOTIL) 2.5-0.025 MG tablet Take 1 tablet by mouth 4 (four) times daily as needed for diarrhea or loose stools.    . docusate sodium (COLACE) 100 MG capsule Take 1 capsule (100 mg total) by mouth 2 (two) times daily as needed for mild constipation. 20 capsule 0  . fexofenadine (ALLEGRA) 180 MG tablet Take 180 mg by mouth daily.     . Fluticasone-Salmeterol (ADVAIR) 250-50 MCG/DOSE AEPB Inhale 1 puff into the lungs daily.    . hydrochlorothiazide (HYDRODIURIL) 25 MG tablet Take 25 mg by mouth daily.    Marland Kitchen. latanoprost (XALATAN) 0.005 % ophthalmic solution Place 1 drop into both eyes at bedtime.    Marland Kitchen. loperamide (IMODIUM) 2 MG capsule Take 2 mg by mouth as needed for diarrhea or loose stools.    . meclizine (ANTIVERT) 25 MG tablet Take 25 mg by mouth 3 (three) times daily as needed for dizziness.    . promethazine (PHENERGAN) 25 MG tablet Take 25 mg by mouth every 6 (six) hours as needed for nausea or vomiting.    . propranolol (INDERAL) 60 MG tablet Take 60 mg by mouth 2 (two) times daily.    . timolol (BETIMOL) 0.5 % ophthalmic solution Place 1 drop into the right eye daily.    Marland Kitchen. triamcinolone cream (KENALOG) 0.1 % Apply 1 application topically daily.    Marland Kitchen. venlafaxine XR (EFFEXOR-XR) 150 MG 24 hr capsule Take 1 capsule (150 mg total) by mouth daily with breakfast. 90 capsule 0  . gabapentin (NEURONTIN) 100 MG capsule Take 100 mg by mouth 2 (two) times daily.     No current facility-administered medications for this visit.     Neurologic: Headache: No Seizure: No Paresthesias: No  Musculoskeletal: Strength & Muscle Tone: decreased Gait & Station: unsteady, uses a walker Patient leans: N/A  Psychiatric Specialty Exam: Review of Systems  Musculoskeletal: Positive for back pain, joint pain and neck pain.  Neurological: Negative for dizziness and focal weakness.  Psychiatric/Behavioral: Positive for depression. Negative for hallucinations, substance abuse and suicidal ideas. The patient is nervous/anxious and has insomnia.   All other systems reviewed and are negative.   Blood pressure (!) 150/70, pulse (!) 55, height 5' 0.5" (1.537 m), weight 145 lb 9.6 oz (66 kg), SpO2 91 %.Body mass index is 27.97 kg/m.  General Appearance: Fairly Groomed  Eye Contact:  Good  Speech:  Clear and Coherent  Volume:  Normal   Mood:  not good  Affect:  less restricted- smiles at times  Thought Process:  Coherent and Goal Directed  Orientation:  Full (Time, Place, and Person)  Thought Content: denies paranoia Perceptions: denies current AH/VH  Suicidal Thoughts:  No  Homicidal Thoughts:  No  Memory:  Immediate;   Fair Recent;   Fair Remote;   Fair  Judgement:  Fair  Insight:  Shallow  Psychomotor Activity:  Normal  Concentration:  Concentration: Fair and Attention Span: Fair  Recall:  [employer]Fair  Fund of Knowledge: Good  Language: Good  Akathisia:  No  Handed:  Right  AIMS (if indicated):  N/A  Assets:  Communication Skills Desire for Improvement  ADL's:  Intact  Cognition: WNL  Sleep:  fair   Assessment AERIELLE STOKLOSA is a 83 year old female with depression, anxiety, hypertension, hyperlipidemia, irritable bowel syndrome, left patella fracture s/p ORIF who presents for follow up appointment for depression.   # MDD # Unspecified anxiety disorder There has been overall improvement in her neurovegetative symptoms, although she ruminates at times on her anxiety. Will continue current dose of venlafaxine to target her mood. Will continue clonazepam for anxiety. Although it is preferable to avoid this medication in geriatric population, will continue this medication given she and her daughter reports significant benefit for her anxiety.  Discussed risk of dizziness, dependence, fall and oversedation. Discussed in length about behavioral activation, which would be also helpful for her to cope with anxiety. Validated her stress of limited (I)ADL (unable to walk outside or drive).   Plan 1. Continue venlafaxine 150 mg daily 2. Continue clonazepam 1 mg during the day and 0.5 mg at night 3. Return to clinic in three months for 30 mins (She is advised to see ENT for hearing loss and PCP for hypertension)  Treatment Plan Summary: Plan as above  The patient demonstrates the following  risk factors for suicide:  Chronic risk factors for suicide include psychiatric disorder,  demographic factors ( >55 yo). Acute risk factors for suicide include none. Protective factors for this patient include positive social support, hope for the future. Considering these factors, the overall suicide risk at this point appears to be low.  The duration of this appointment visit was 30 minutes of face-to-face time with the patient.  Greater than 50% of this time was spent in counseling, explanation of  diagnosis, planning of further management, and coordination of care.  [name], MD 03/12/2017, 12:44 PM

## 2017-03-12 ENCOUNTER — Encounter (HOSPITAL_COMMUNITY): Payer: Self-pay | Admitting: Psychiatry

## 2017-03-12 ENCOUNTER — Ambulatory Visit (INDEPENDENT_AMBULATORY_CARE_PROVIDER_SITE_OTHER): Payer: Medicare Other | Admitting: Psychiatry

## 2017-03-12 VITALS — BP 150/70 | HR 55 | Ht 60.5 in | Wt 145.6 lb

## 2017-03-12 DIAGNOSIS — F419 Anxiety disorder, unspecified: Secondary | ICD-10-CM

## 2017-03-12 DIAGNOSIS — F331 Major depressive disorder, recurrent, moderate: Secondary | ICD-10-CM

## 2017-03-12 DIAGNOSIS — E785 Hyperlipidemia, unspecified: Secondary | ICD-10-CM

## 2017-03-12 DIAGNOSIS — I10 Essential (primary) hypertension: Secondary | ICD-10-CM | POA: Diagnosis not present

## 2017-03-12 DIAGNOSIS — K589 Irritable bowel syndrome without diarrhea: Secondary | ICD-10-CM | POA: Diagnosis not present

## 2017-03-12 MED ORDER — VENLAFAXINE HCL ER 150 MG PO CP24: 150.0000 mg | ORAL_CAPSULE | Freq: Every day | ORAL | 0 refills | Status: DC

## 2017-03-12 MED ORDER — CLONAZEPAM 1 MG PO TABS: ORAL_TABLET | ORAL | 0 refills | Status: DC

## 2017-03-12 NOTE — Patient Instructions (Addendum)
1. Continue venlafaxine 150 mg daily 2. Continue clonazepam 1 mg during the day and 0.5 mg at night 3. Return to clinic in three months for 30 mins 

## 2017-03-31 IMAGING — CR DG CHEST 1V PORT
1 series · 1 of 1 positions shown · non-contrast
Comparison: None.

CLINICAL DATA: Preop evaluation. Patient fell yesterday injuring
the left knee. History of hypertension.

EXAM:
PORTABLE CHEST 1 VIEW

[ap portable]
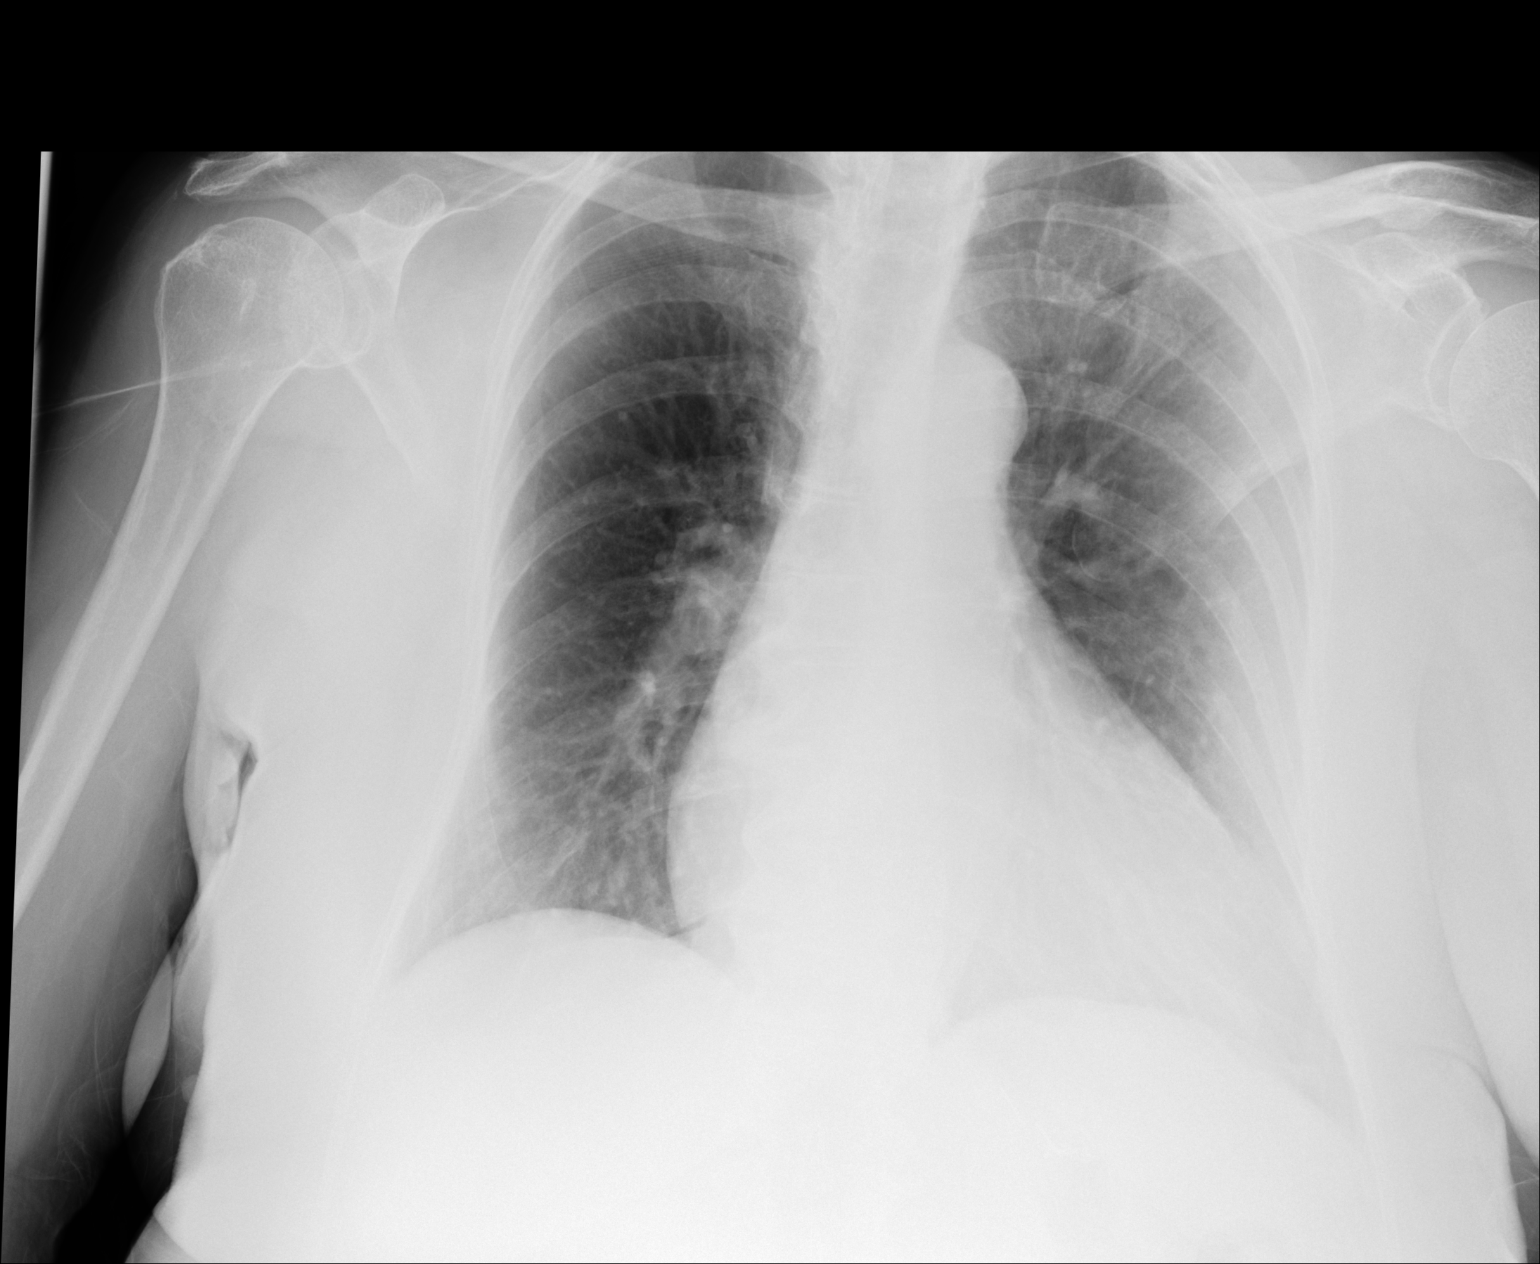

[1 of 1 positions shown; findings below may reference images not displayed]

FINDINGS: Cardiac silhouette is mildly enlarged. No mediastinal or hilar
masses or evidence of adenopathy.

Lungs are clear.  No pleural effusion pneumothorax.

Bony thorax is demineralized but grossly intact.
IMPRESSION: No acute cardiopulmonary disease.  Mild cardiomegaly.

## 2017-04-01 IMAGING — RF DG KNEE 3 VIEWS*L*
1 series · 6 of 6 positions shown · non-contrast
Comparison: None.

CLINICAL DATA: Operative fixation of a left patellar fracture.

EXAM:
DG C-ARM 61-120 MIN; LEFT KNEE - 3 VIEW

[Series 1: run · 6 of 6 slices shown]
[im 1/6]
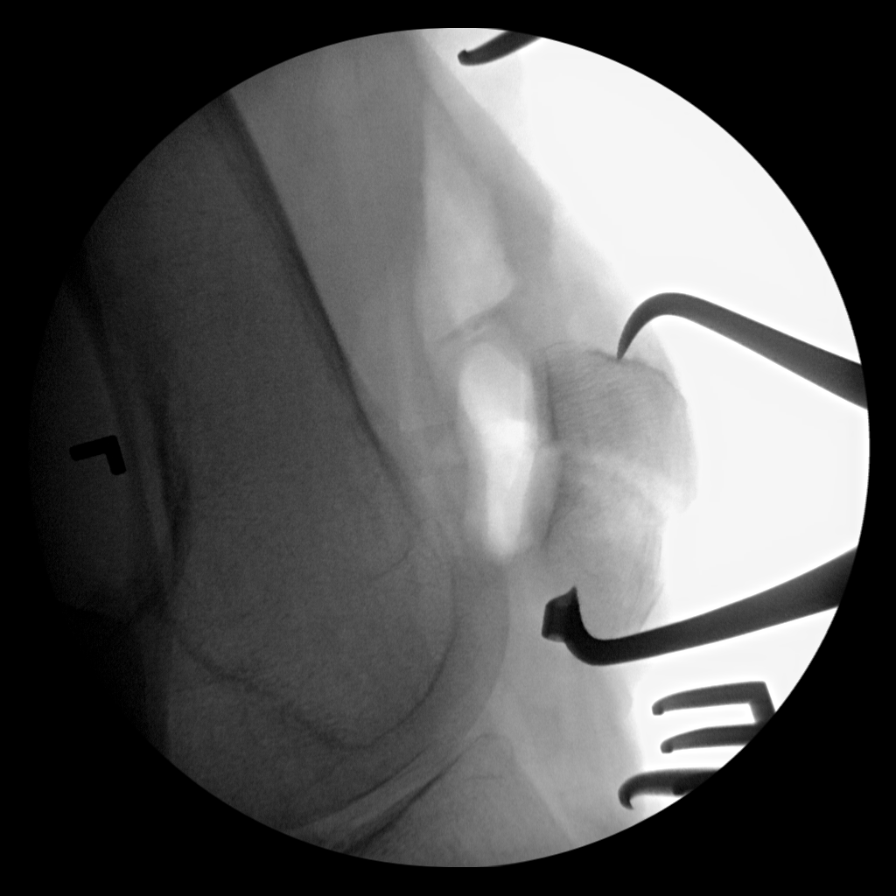
[im 2/6]
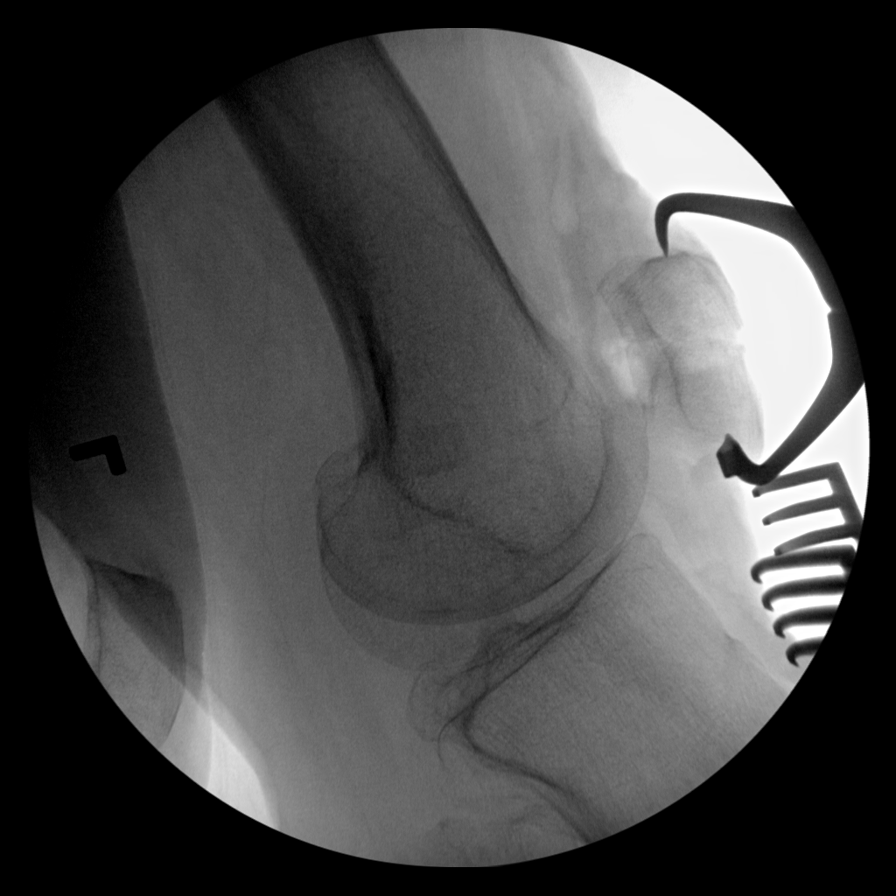
[im 3/6]
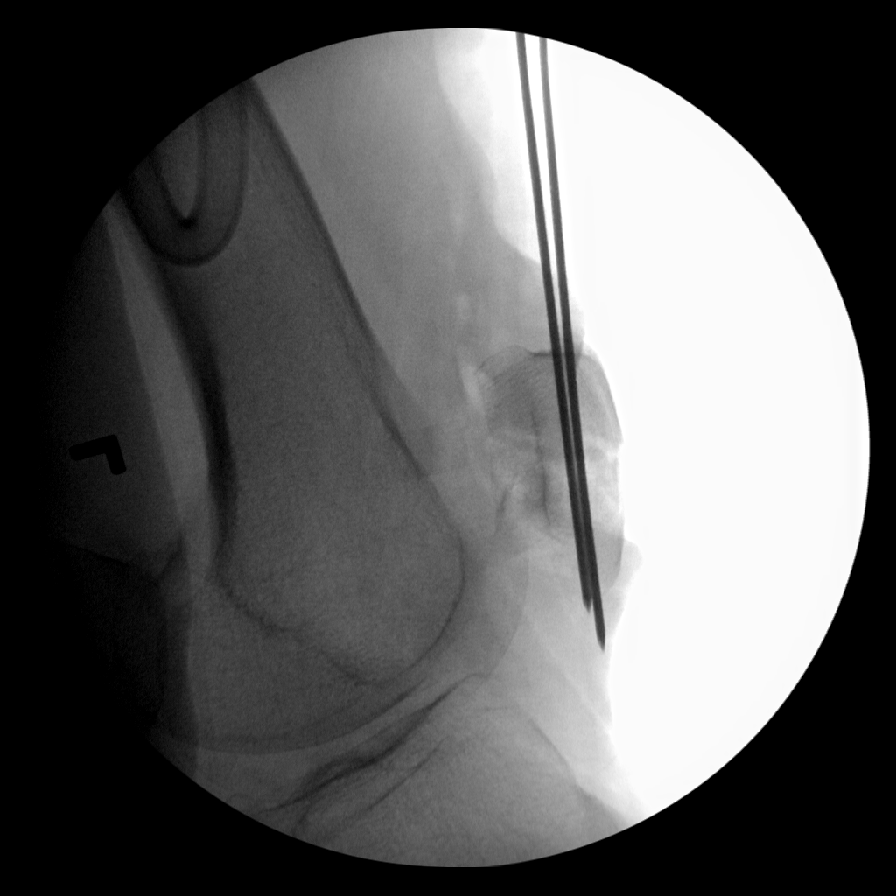
[im 4/6]
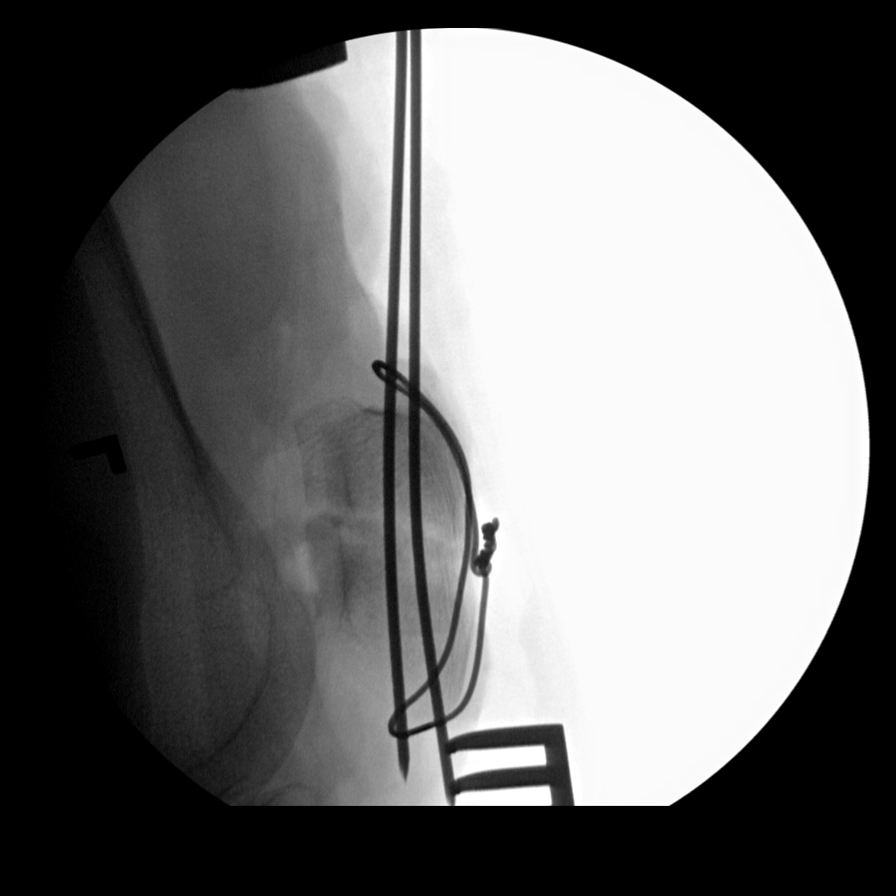
[im 5/6]
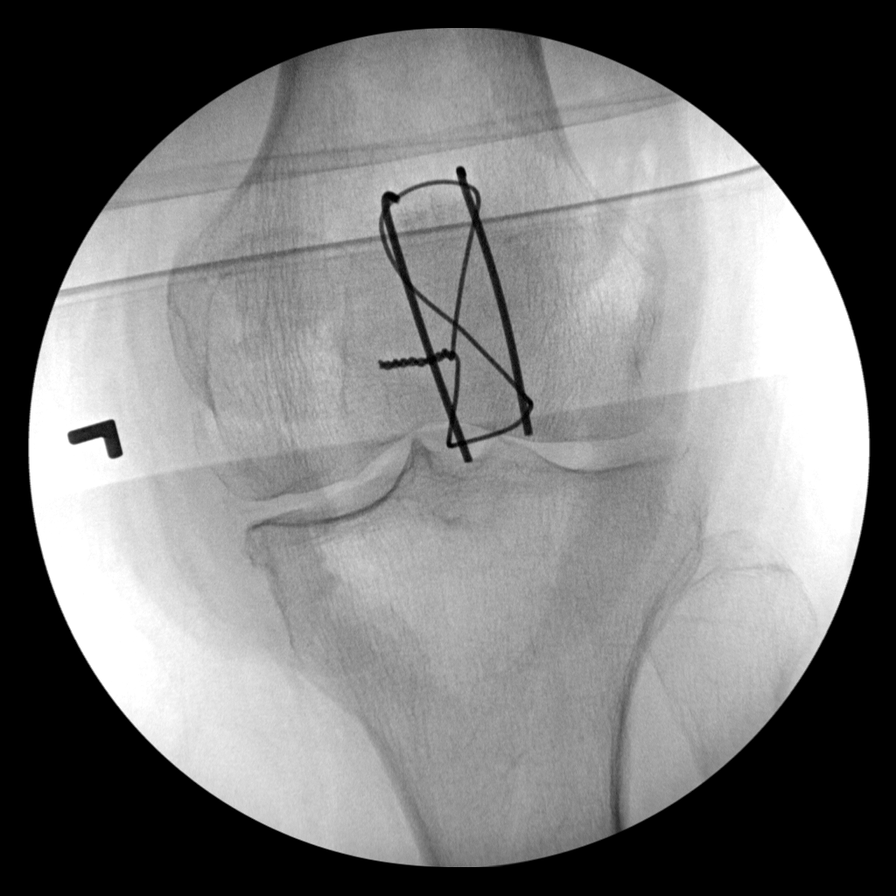
[im 6/6]
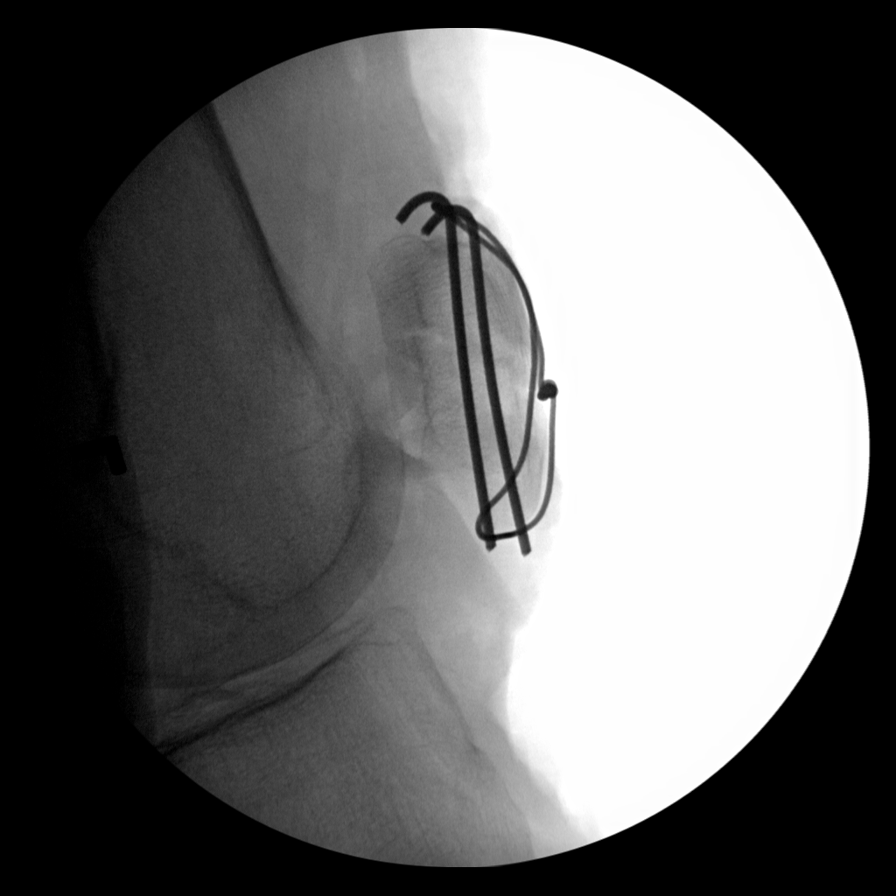

[6 of 6 positions shown; findings below may reference images not displayed]

FINDINGS: Six C-arm views of the left knee demonstrate a transverse fracture
of the mid patella. K-wire and cerclage fixation is demonstrated
with anatomic position and alignment.
IMPRESSION: K-wire cerclage wire fixation of a transverse fracture of the
patella with anatomic position and alignment on these views.

## 2017-05-29 DIAGNOSIS — K589 Irritable bowel syndrome without diarrhea: Secondary | ICD-10-CM | POA: Diagnosis not present

## 2017-05-29 DIAGNOSIS — F419 Anxiety disorder, unspecified: Secondary | ICD-10-CM | POA: Diagnosis not present

## 2017-05-29 DIAGNOSIS — Z6828 Body mass index (BMI) 28.0-28.9, adult: Secondary | ICD-10-CM | POA: Diagnosis not present

## 2017-05-29 DIAGNOSIS — Z299 Encounter for prophylactic measures, unspecified: Secondary | ICD-10-CM | POA: Diagnosis not present

## 2017-05-29 DIAGNOSIS — F0280 Dementia in other diseases classified elsewhere without behavioral disturbance: Secondary | ICD-10-CM | POA: Diagnosis not present

## 2017-05-29 DIAGNOSIS — R410 Disorientation, unspecified: Secondary | ICD-10-CM | POA: Diagnosis not present

## 2017-05-29 DIAGNOSIS — I10 Essential (primary) hypertension: Secondary | ICD-10-CM | POA: Diagnosis not present

## 2017-05-29 DIAGNOSIS — K769 Liver disease, unspecified: Secondary | ICD-10-CM | POA: Diagnosis not present

## 2017-05-29 DIAGNOSIS — Q613 Polycystic kidney, unspecified: Secondary | ICD-10-CM | POA: Diagnosis not present

## 2017-05-29 DIAGNOSIS — J449 Chronic obstructive pulmonary disease, unspecified: Secondary | ICD-10-CM | POA: Diagnosis not present

## 2017-05-29 DIAGNOSIS — G309 Alzheimer's disease, unspecified: Secondary | ICD-10-CM | POA: Diagnosis not present

## 2017-06-09 NOTE — Progress Notes (Signed)
BH MD/PA/NP OP Progress Note  06/12/2017 4:56 PM Bonnie Rush  MRN:  [id number]  Chief Complaint:  Chief Complaint    Follow-up; Depression     HPI:  Patient presents for follow up appointment for depression. She talks about losses of her friends and family. She states that she feels the same. She enjoys reading articles and books. She agrees that she tends to be irritable, although she tries not to act on it. She stays in the house most of the time due to pain. Her son helps her to do gardening. She admits that she has a slightly worsening memory loss and feels confused at times. She denies feeling depressed. She denies SI, AH, VH.   Her daughter presents to the interview.  She states that the patient appears to be more irritable and yelling at others at times. She appears to be confused at times; she could not figure out which way she should go after getting out of the car before this appointment. She believes that it has become worse over time. Patient sleeps at 10 PM through 1 PM the next day. The daughter takes care of her medication. No safety issues. ADL independent.   Per [facility] Clonazepam filled on 03/12/2017 for 90 days   Visit Diagnosis:    ICD-10-CM   1. Moderate episode of recurrent major depressive disorder (HCC) F33.1   2. Major neurocognitive disorder F03.90     Past Psychiatric History:  I have reviewed the patient's psychiatry history in detail and updated the patient record. Outpatient: seen a psychiatrist years ago Psychiatry admission: denies Previous suicide attempt: denies Past trials of medication: Venlafaxine, clonazepam, Xanax (AH, VH, paranoia), Trazodone (palpitation) History of violence: denies  Past Medical History:  Past Medical History:  Diagnosis Date  . Glaucoma   . High cholesterol   . Hypertension   . Irritable bowel syndrome   . Osteoporosis   . Palpitations     Past Surgical History:  Procedure Laterality Date  . ABDOMINAL  HYSTERECTOMY    . BREAST SURGERY    . CHOLECYSTECTOMY    . EYE SURGERY    . ORIF PATELLA Left 01/19/2016   Procedure: OPEN REDUCTION INTERNAL (ORIF) FIXATION LEFT PATELLA  FRACTURE;  Surgeon: Vickki Hearing, MD;  Location: [facility];  Service: Orthopedics;  Laterality: Left;    Family Psychiatric History:  I have reviewed the patient's family history in detail and updated the patient record.  Family History: No family history on file.  Social History:  Social History   Social History  . Marital status: Widowed    Spouse name: N/A  . Number of children: N/A  . Years of education: N/A   Social History Main Topics  . Smoking status: Never Smoker  . Smokeless tobacco: Never Used  . Alcohol use No  . Drug use: No  . Sexual activity: Not Currently   Other Topics Concern  . None   Social History Narrative  . None   She is a widow and she has been married 3 times  Allergies:  Allergies  Allergen Reactions  . Penicillins     Unknown-childhood allergy    Metabolic Disorder Labs: No results found for: HGBA1C, MPG No results found for: PROLACTIN No results found for: CHOL, TRIG, HDL, CHOLHDL, VLDL, LDLCALC No results found for: TSH  Therapeutic Level Labs: No results found for: LITHIUM No results found for: VALPROATE No components found for:  CBMZ  Current Medications: Current Outpatient Prescriptions  Medication Sig Dispense Refill  . alendronate (FOSAMAX) 70 MG tablet Take 70 mg by mouth once a week. Take with a full glass of water on an empty stomach.Takes on Monday    . atorvastatin (LIPITOR) 20 MG tablet Take 20 mg by mouth daily.    . Calcium Carb-Cholecalciferol (CALCIUM 600 + D PO) Take 1 tablet by mouth daily.    . clonazePAM (KLONOPIN) 1 MG tablet Take 1 mg daily and 0.5 mg at night 135 tablet 0  . dexlansoprazole (DEXILANT) 60 MG capsule Take 60 mg by mouth daily.    . diphenoxylate-atropine (LOMOTIL) 2.5-0.025 MG tablet Take 1 tablet by mouth 4 (four)  times daily as needed for diarrhea or loose stools.    . docusate sodium (COLACE) 100 MG capsule Take 1 capsule (100 mg total) by mouth 2 (two) times daily as needed for mild constipation. 20 capsule 0  . fexofenadine (ALLEGRA) 180 MG tablet Take 180 mg by mouth daily.    . Fluticasone-Salmeterol (ADVAIR) 250-50 MCG/DOSE AEPB Inhale 1 puff into the lungs daily.    Marland Kitchen gabapentin (NEURONTIN) 100 MG capsule Take 100 mg by mouth 2 (two) times daily.    . hydrochlorothiazide (HYDRODIURIL) 25 MG tablet Take 25 mg by mouth daily.    Marland Kitchen latanoprost (XALATAN) 0.005 % ophthalmic solution Place 1 drop into both eyes at bedtime.    Marland Kitchen loperamide (IMODIUM) 2 MG capsule Take 2 mg by mouth as needed for diarrhea or loose stools.    . meclizine (ANTIVERT) 25 MG tablet Take 25 mg by mouth 3 (three) times daily as needed for dizziness.    . promethazine (PHENERGAN) 25 MG tablet Take 25 mg by mouth every 6 (six) hours as needed for nausea or vomiting.    . propranolol (INDERAL) 60 MG tablet Take 60 mg by mouth 2 (two) times daily.    . timolol (BETIMOL) 0.5 % ophthalmic solution Place 1 drop into the right eye daily.    Marland Kitchen triamcinolone cream (KENALOG) 0.1 % Apply 1 application topically daily.    Marland Kitchen venlafaxine XR (EFFEXOR-XR) 150 MG 24 hr capsule Take 1 capsule (150 mg total) by mouth daily with breakfast. 90 capsule 0   No current facility-administered medications for this visit.      Musculoskeletal: Strength & Muscle Tone: within normal limits Gait & Station: normal Patient leans: N/A  Psychiatric Specialty Exam: Review of Systems  Psychiatric/Behavioral: Positive for depression and memory loss. Negative for hallucinations, substance abuse and suicidal ideas. The patient is nervous/anxious. The patient does not have insomnia.   All other systems reviewed and are negative.   Blood pressure 136/78, pulse 66, height 5' 0.5" (1.537 m), weight 144 lb (65.3 kg).Body mass index is 27.66 kg/m.  General  Appearance: Fairly Groomed  Eye Contact:  Good  Speech:  Clear and Coherent  Volume:  Normal  Mood:  "fine"  Affect:  Appropriate, Congruent and slightly restricted  Thought Process:  Coherent and Goal Directed  Orientation:  Full (Time, Place, and Person)- except place  Thought Content: Logical Perceptions: denies AH/VH  Suicidal Thoughts:  No  Homicidal Thoughts:  No  Memory:  Immediate;   Poor Recent;   Poor Remote;   Poor  Judgement:  Fair  Insight:  Present  Psychomotor Activity:  Normal  Concentration:  Concentration: Good and Attention Span: Good  Recall:  Good  Fund of Knowledge: Good  Language: Good  Akathisia:  No  Handed:  Right  AIMS (if indicated): not  done  Assets:  Communication Skills Desire for Improvement  ADL's:  Intact  Cognition: WNL  Sleep:  Good  MOCA 11 on 05/2017. : +2 for drawing clock,+2 for naming, +2 for attention, +5 for orientation  Screenings:   Assessment and Plan:  JENNET SCROGGIN is a 83 y.o. year old female with a history of depression, anxiety, hypertension, hyperlipidemia, irritable bowel syndrome, left patella fracture s/p ORIF, who presents for follow up appointment for Moderate episode of recurrent major depressive disorder (HCC)  Major neurocognitive disorder  # MDD # Unspecified anxiety disorder She reports occasional depression and anxiety. Will continue venlafaxine to target depression. Will continue clonazepam for anxiety. Although it is less preferred to use this medication for geriatric population, will continue given both patient and her daughter reports significant benefit from this medication. Discussed risk of fall, confusion and dependence. Validated her demoralization due to limited activity. Discussed behavioral activation.   # Major neurocognitive disorder- mild Her daughter reports gradual worsening in her memory. Exam is notable for cognitive deficits as in [facility] as described above. IADL partially independent, ADL  independent. Will continue to monitor and start aricept if any worsening in her symptoms.   Plan 1. Continue venlafaxine 150 mg daily 2. Continue clonazepam 1 mg during the day and 0.5 mg at night 3. Return to clinic in three months for 30 mins  Treatment Plan Summary: Plan as above  The patient demonstrates the following risk factors for suicide: Chronic risk factorsfor suicide include psychiatric disorder, demographic factors ( >90 yo). Acute risk factorsfor suicide includenone.Protective factorsfor this patient include positive social support,hope for the future. Considering these factors, the overall suicide risk at this point appears to be low.  The duration of this appointment visit was 30 minutes of face-to-face time with the patient.  Greater than 50% of this time was spent in counseling, explanation of  diagnosis, planning of further management, and coordination of care.  [name], MD 06/12/2017, 4:56 PM

## 2017-06-11 DIAGNOSIS — J449 Chronic obstructive pulmonary disease, unspecified: Secondary | ICD-10-CM | POA: Diagnosis not present

## 2017-06-11 DIAGNOSIS — I10 Essential (primary) hypertension: Secondary | ICD-10-CM | POA: Diagnosis not present

## 2017-06-11 DIAGNOSIS — M159 Polyosteoarthritis, unspecified: Secondary | ICD-10-CM | POA: Diagnosis not present

## 2017-06-12 ENCOUNTER — Encounter (HOSPITAL_COMMUNITY): Payer: Self-pay | Admitting: Psychiatry

## 2017-06-12 ENCOUNTER — Ambulatory Visit (INDEPENDENT_AMBULATORY_CARE_PROVIDER_SITE_OTHER): Payer: Medicare Other | Admitting: Psychiatry

## 2017-06-12 VITALS — BP 136/78 | HR 66 | Ht 60.5 in | Wt 144.0 lb

## 2017-06-12 DIAGNOSIS — E785 Hyperlipidemia, unspecified: Secondary | ICD-10-CM

## 2017-06-12 DIAGNOSIS — F0150 Vascular dementia without behavioral disturbance: Secondary | ICD-10-CM | POA: Insufficient documentation

## 2017-06-12 DIAGNOSIS — Z79899 Other long term (current) drug therapy: Secondary | ICD-10-CM

## 2017-06-12 DIAGNOSIS — F0390 Unspecified dementia without behavioral disturbance: Secondary | ICD-10-CM | POA: Diagnosis not present

## 2017-06-12 DIAGNOSIS — F419 Anxiety disorder, unspecified: Secondary | ICD-10-CM | POA: Diagnosis not present

## 2017-06-12 DIAGNOSIS — K589 Irritable bowel syndrome without diarrhea: Secondary | ICD-10-CM

## 2017-06-12 DIAGNOSIS — I10 Essential (primary) hypertension: Secondary | ICD-10-CM

## 2017-06-12 DIAGNOSIS — F331 Major depressive disorder, recurrent, moderate: Secondary | ICD-10-CM

## 2017-06-12 MED ORDER — VENLAFAXINE HCL ER 150 MG PO CP24: 150.0000 mg | ORAL_CAPSULE | Freq: Every day | ORAL | 0 refills | Status: DC

## 2017-06-12 MED ORDER — CLONAZEPAM 1 MG PO TABS: ORAL_TABLET | ORAL | 0 refills | Status: DC

## 2017-06-12 NOTE — Patient Instructions (Signed)
1. Continue venlafaxine 150 mg daily 2. Continue clonazepam 1 mg during the day and 0.5 mg at night 3. Return to clinic in three months for 30 mins 

## 2017-07-14 DIAGNOSIS — I10 Essential (primary) hypertension: Secondary | ICD-10-CM | POA: Diagnosis not present

## 2017-07-14 DIAGNOSIS — M159 Polyosteoarthritis, unspecified: Secondary | ICD-10-CM | POA: Diagnosis not present

## 2017-07-14 DIAGNOSIS — J449 Chronic obstructive pulmonary disease, unspecified: Secondary | ICD-10-CM | POA: Diagnosis not present

## 2017-08-04 DIAGNOSIS — Z970 Presence of artificial eye: Secondary | ICD-10-CM | POA: Diagnosis not present

## 2017-08-04 DIAGNOSIS — Z961 Presence of intraocular lens: Secondary | ICD-10-CM | POA: Diagnosis not present

## 2017-08-04 DIAGNOSIS — H35371 Puckering of macula, right eye: Secondary | ICD-10-CM | POA: Diagnosis not present

## 2017-08-04 DIAGNOSIS — H43811 Vitreous degeneration, right eye: Secondary | ICD-10-CM | POA: Diagnosis not present

## 2017-08-04 DIAGNOSIS — H401112 Primary open-angle glaucoma, right eye, moderate stage: Secondary | ICD-10-CM | POA: Diagnosis not present

## 2017-09-02 DIAGNOSIS — I10 Essential (primary) hypertension: Secondary | ICD-10-CM | POA: Diagnosis not present

## 2017-09-02 DIAGNOSIS — M159 Polyosteoarthritis, unspecified: Secondary | ICD-10-CM | POA: Diagnosis not present

## 2017-09-02 DIAGNOSIS — J449 Chronic obstructive pulmonary disease, unspecified: Secondary | ICD-10-CM | POA: Diagnosis not present

## 2017-09-09 NOTE — Progress Notes (Signed)
[facility] MD/PA/NP OP Progress Note  09/11/2017 2:23 PM Bonnie Rush  MRN:  [id number]  Chief Complaint:  Chief Complaint    Depression; Follow-up; Anxiety     HPI:  Patient presents for follow-up appointment for depression and mild major neurocognitive disorder.  She states that she has been doing well.  She went to Bonnie son's house for Thanksgiving dinner.  She states that two of Bonnie great grandchildren moved to [place]. She feels sorry for Bonnie daughter as Bonnie daughter has raised them since born. She feels better. She has occasional anxiety. She feels less irritable. She denies panic attacks. However, she also complains that she wants to increase clonazepam as lower dose does not work for Bonnie. She denies insomnia. She feels depressed at times. She has fair energy. She tends to stay in the house, doing nothings. She denies SI, HI. She reports occasional abdominal pain. She complains of memory loss.   Bonnie daughter presents to the interview.  She states that Bonnie son was killed in car accident before Thanksgiving. It has been rough time for the patient. The patient had not been dispensed effexor since she has been seen by this writer (05/2016). The patient restarted it about a month ago and the patient seems to be doing better. No significant safety issues. The patient has some memory loss.   Updated the following ADL/IADL Functional Status Instrumental Activities of Daily Living (IADLs):  [name] Rush independent in the following: n/a Requires assistance with the following: managing finances, medications, driving  Activities of Daily Living (ADLs):  Bonnie Rush Rush independent in the following: bathing and hygiene, feeding, continence, grooming and toileting, walking  Wt Readings from Last 3 Encounters:  09/11/17 147 lb (66.7 kg)  06/12/17 144 lb (65.3 kg)  03/12/17 145 lb 9.6 oz (66 kg)   Per PMP,  Clonazepam filled on 06/12/2017 for 90 days I have utilized the [place] Controlled  Substances Reporting System (PMP AWARxE) to confirm adherence regarding the patient's medication. My review reveals appropriate prescription fills.    Visit Diagnosis:    ICD-10-CM   1. Moderate episode of recurrent major depressive disorder (HCC) F33.1     Past Psychiatric History:  I have reviewed the patient's psychiatry history in detail and updated the patient record. Outpatient: seen a psychiatrist years ago Psychiatry admission: denies Previous suicide attempt: denies Past trials of medication: Venlafaxine, clonazepam, Xanax (AH, VH, paranoia), Trazodone (palpitation) History of violence: denies    Past Medical History:  Past Medical History:  Diagnosis Date  . Glaucoma   . High cholesterol   . Hypertension   . Irritable bowel syndrome   . Osteoporosis   . Palpitations     Past Surgical History:  Procedure Laterality Date  . ABDOMINAL HYSTERECTOMY    . BREAST SURGERY    . CHOLECYSTECTOMY    . EYE SURGERY    . ORIF PATELLA Left 01/19/2016   Procedure: OPEN REDUCTION INTERNAL (ORIF) FIXATION LEFT PATELLA  FRACTURE;  Surgeon: Vickki Hearing, MD;  Location: [facility];  Service: Orthopedics;  Laterality: Left;    Family Psychiatric History: I have reviewed the patient's family history in detail and updated the patient record.  Family History: No family history on file.  Social History:  Social History   Socioeconomic History  . Marital status: Widowed    Spouse name: None  . Number of children: None  . Years of education: None  . Highest education level: None  Social Needs  .  Financial resource strain: None  . Food insecurity - worry: None  . Food insecurity - inability: None  . Transportation needs - medical: None  . Transportation needs - non-medical: None  Occupational History  . None  Tobacco Use  . Smoking status: Never Smoker  . Smokeless tobacco: Never Used  Substance and Sexual Activity  . Alcohol use: No  . Drug use: No  . Sexual activity:  Not Currently  Other Topics Concern  . None  Social History Narrative  . None    Allergies:  Allergies  Allergen Reactions  . Penicillins     Unknown-childhood allergy    Metabolic Disorder Labs: No results found for: HGBA1C, MPG No results found for: PROLACTIN No results found for: CHOL, TRIG, HDL, CHOLHDL, VLDL, LDLCALC No results found for: TSH  Therapeutic Level Labs: No results found for: LITHIUM No results found for: VALPROATE No components found for:  CBMZ  Current Medications: Current Outpatient Medications  Medication Sig Dispense Refill  . alendronate (FOSAMAX) 70 MG tablet Take 70 mg by mouth once a week. Take with a full glass of water on an empty stomach.Takes on Monday    . atorvastatin (LIPITOR) 20 MG tablet Take 20 mg by mouth daily.    . Calcium Carb-Cholecalciferol (CALCIUM 600 + D PO) Take 1 tablet by mouth daily.    . clonazePAM (KLONOPIN) 1 MG tablet Take 1 mg daily and 0.5 mg at night 135 tablet 0  . dexlansoprazole (DEXILANT) 60 MG capsule Take 60 mg by mouth daily.    . diphenoxylate-atropine (LOMOTIL) 2.5-0.025 MG tablet Take 1 tablet by mouth 4 (four) times daily as needed for diarrhea or loose stools.    . docusate sodium (COLACE) 100 MG capsule Take 1 capsule (100 mg total) by mouth 2 (two) times daily as needed for mild constipation. 20 capsule 0  . fexofenadine (ALLEGRA) 180 MG tablet Take 180 mg by mouth daily.    . Fluticasone-Salmeterol (ADVAIR) 250-50 MCG/DOSE AEPB Inhale 1 puff into the lungs daily.    Marland Kitchen. gabapentin (NEURONTIN) 100 MG capsule Take 100 mg by mouth 2 (two) times daily.    . hydrochlorothiazide (HYDRODIURIL) 25 MG tablet Take 25 mg by mouth daily.    Marland Kitchen. latanoprost (XALATAN) 0.005 % ophthalmic solution Place 1 drop into both eyes at bedtime.    Marland Kitchen. loperamide (IMODIUM) 2 MG capsule Take 2 mg by mouth as needed for diarrhea or loose stools.    . meclizine (ANTIVERT) 25 MG tablet Take 25 mg by mouth 3 (three) times daily as needed  for dizziness.    . promethazine (PHENERGAN) 25 MG tablet Take 25 mg by mouth every 6 (six) hours as needed for nausea or vomiting.    . propranolol (INDERAL) 60 MG tablet Take 60 mg by mouth 2 (two) times daily.    . timolol (BETIMOL) 0.5 % ophthalmic solution Place 1 drop into the right eye daily.    Marland Kitchen. triamcinolone cream (KENALOG) 0.1 % Apply 1 application topically daily.    Marland Kitchen. venlafaxine XR (EFFEXOR-XR) 150 MG 24 hr capsule Take 1 capsule (150 mg total) by mouth daily with breakfast. 90 capsule 0  . donepezil (ARICEPT) 5 MG tablet Take 1 tablet (5 mg total) by mouth at bedtime. 90 tablet 0   No current facility-administered medications for this visit.      Musculoskeletal: Strength & Muscle Tone: within normal limits Gait & Station: normal Patient leans: N/A  Psychiatric Specialty Exam: Review of Systems  Gastrointestinal: Positive for abdominal pain.  Psychiatric/Behavioral: Positive for depression and memory loss. Negative for hallucinations, substance abuse and suicidal ideas. The patient Rush nervous/anxious. The patient does not have insomnia.   All other systems reviewed and are negative.   Blood pressure (!) 144/64, pulse 61, height 5' 0.5" (1.537 m), weight 147 lb (66.7 kg), SpO2 95 %.Body mass index Rush 28.24 kg/m.  General Appearance: Fairly Groomed  Eye Contact:  Good  Speech:  Clear and Coherent  Volume:  Normal  Mood:  "better"  Affect:  Appropriate, Congruent and less restricted, improving  Thought Process:  Coherent  Orientation:  Full (Time, Place, and Person)  Thought Content: Logical Perceptions: denies AH/VH  Suicidal Thoughts:  No  Homicidal Thoughts:  No  Memory:  Immediate;   Fair  Judgement:  Fair  Insight:  Present  Psychomotor Activity:  Normal  Concentration:  Concentration: Fair and Attention Span: Fair  Recall:  Poor  Fund of Knowledge: Fair  Language: Good  Akathisia:  No  Handed:  Right  AIMS (if indicated): not done  Assets:   Communication Skills Desire for Improvement  ADL's:  Intact  Cognition: WNL  Sleep:  Good   Screenings: MOCA 11 on 05/2017. : +2 for drawing clock,+2 for naming, +2 for attention, +5 for orientation  Assessment and Plan:  Lura EmDoris H Frei Rush a 83 y.o. year old female with a history of depression, anxiety, hypertension, hyperlipidemia, irritable bowel syndrome, left patella fracture s/p ORIF, who presents for follow up appointment for Moderate episode of recurrent major depressive disorder (HCC)  # MDD # Unspecified anxiety disorder There has been overall improvement in  neurovegetative symptoms and anxiety since the last appointment; the daughter states that she may not have been taking Effexor as it was not dispensed at the pharmacy.  She has restarted this medication since the last appointment.  Will continue current dose to target depression and anxiety. Will continue clonazepam as needed for anxiety; although it Rush less recommended to use this medication, will continue for now given the patient and Bonnie daughter reports significant benefit from this medication. Discussed risk of dependence and fall. Discussed behavioral activation.    # Major neurocognitive disorder- mild Patient and Bonnie daughter reports gradual worsening in memory.  Exam Rush notable for cognitive deficits as a MoCA.  Will start Aricept to target dementia.  Discussed side effect of GI symptoms.  She Rush advised to discontinue medication if any worsening in Bonnie GI symptoms given she has irritable bowel syndrome.  Plan .I have reviewed and updated plans as below 1. Continue venlafaxine 150 mg daily 2. Continue clonazepam 1 mg during the day and 0.5 mg at night 3. Return to clinic in three months for 30 mins  Treatment Plan Summary: Plan as above  The patient demonstrates the following risk factors for suicide: Chronic risk factorsfor suicide include psychiatric disorder, demographic factors 104( >60 yo). Acute risk  factorsfor suicide includenone.Protective factorsfor this patient include positive social support,hope for the future. Considering these factors, the overall suicide risk at this point appears to be low.  The duration of this appointment visit was 30 minutes of face-to-face time with the patient.  Greater than 50% of this time was spent in counseling, explanation of  diagnosis, planning of further management, and coordination of care.   Neysa Hottereina Shakirah Kirkey, MD 09/11/2017, 2:23 PM

## 2017-09-10 DIAGNOSIS — K589 Irritable bowel syndrome without diarrhea: Secondary | ICD-10-CM | POA: Diagnosis not present

## 2017-09-10 DIAGNOSIS — Z6827 Body mass index (BMI) 27.0-27.9, adult: Secondary | ICD-10-CM | POA: Diagnosis not present

## 2017-09-10 DIAGNOSIS — Z299 Encounter for prophylactic measures, unspecified: Secondary | ICD-10-CM | POA: Diagnosis not present

## 2017-09-10 DIAGNOSIS — M25552 Pain in left hip: Secondary | ICD-10-CM | POA: Diagnosis not present

## 2017-09-10 DIAGNOSIS — Z713 Dietary counseling and surveillance: Secondary | ICD-10-CM | POA: Diagnosis not present

## 2017-09-11 ENCOUNTER — Encounter (HOSPITAL_COMMUNITY): Payer: Self-pay | Admitting: Psychiatry

## 2017-09-11 ENCOUNTER — Ambulatory Visit (INDEPENDENT_AMBULATORY_CARE_PROVIDER_SITE_OTHER): Payer: Medicare Other | Admitting: Psychiatry

## 2017-09-11 VITALS — BP 144/64 | HR 61 | Ht 60.5 in | Wt 147.0 lb

## 2017-09-11 DIAGNOSIS — F331 Major depressive disorder, recurrent, moderate: Secondary | ICD-10-CM | POA: Diagnosis not present

## 2017-09-11 DIAGNOSIS — F419 Anxiety disorder, unspecified: Secondary | ICD-10-CM

## 2017-09-11 DIAGNOSIS — F0150 Vascular dementia without behavioral disturbance: Secondary | ICD-10-CM | POA: Diagnosis not present

## 2017-09-11 DIAGNOSIS — R450 Nervousness: Secondary | ICD-10-CM | POA: Diagnosis not present

## 2017-09-11 DIAGNOSIS — R109 Unspecified abdominal pain: Secondary | ICD-10-CM

## 2017-09-11 MED ORDER — DONEPEZIL HCL 5 MG PO TABS: 5.0000 mg | ORAL_TABLET | Freq: Every day | ORAL | 0 refills | Status: DC

## 2017-09-11 MED ORDER — CLONAZEPAM 1 MG PO TABS: ORAL_TABLET | ORAL | 0 refills | Status: DC

## 2017-09-11 MED ORDER — VENLAFAXINE HCL ER 150 MG PO CP24: 150.0000 mg | ORAL_CAPSULE | Freq: Every day | ORAL | 0 refills | Status: DC

## 2017-09-11 NOTE — Patient Instructions (Signed)
1. Continue venlafaxine 150 mg daily 2. Continue clonazepam 1 mg during the day and 0.5 mg at night 3. Return to clinic in three months for 30 mins

## 2017-11-10 NOTE — Progress Notes (Addendum)
[facility] MD/PA/NP OP Progress Note  11/12/2017 4:53 PM Bonnie Rush  MRN:  [id number]  Chief Complaint:  Chief Complaint    Depression; Anxiety; Follow-up     HPI:  Patient presents for follow-up appointment for neurocognitive disorder and anxiety.  She states that she feels sad and anxious at times, thinking about her nephew. One of her nephews was deceased. She also talks about other niece, although she has not met her for many years. She likes to read, although she complains of visual loss. She wants to walk around and sees a flower when the weather gets nicer. She states that she has not started Aricept as she never received it. Contacted the pharmacy; they delivered the medication. She also has clonazepam 30 tabs left. Her daughter has not been able to take care of patient medication. .She has good appetite.  She has fair motivation and energy.  She denies SI.  She has a panic attacks a few times per week especially when she thinks about her family.   Her daughter presents to the interview.  There has been no significant change since the last appointment.  No safety concern.   Per PMP,  Clonazepam filled on 09/11/2017 for 90 days I have utilized the [place] Controlled Substances Reporting System (PMP AWARxE) to confirm adherence regarding the patient's medication. My review reveals appropriate prescription fills.   Visit Diagnosis:    ICD-10-CM   1. Moderate episode of recurrent major depressive disorder (HCC) F33.1   2. Major neurocognitive disorder F01.50     Past Psychiatric History:  I have reviewed the patient's psychiatry history in detail and updated the patient record. Outpatient:seen a psychiatrist years ago Psychiatry admission:denies Previous suicide attempt:denies Past trials of medication:Venlafaxine, clonazepam, Xanax (AH, VH, paranoia), Trazodone (palpitation) History of violence:denies   Past Medical History:  Past Medical History:  Diagnosis Date  . Glaucoma    . High cholesterol   . Hypertension   . Irritable bowel syndrome   . Osteoporosis   . Palpitations     Past Surgical History:  Procedure Laterality Date  . ABDOMINAL HYSTERECTOMY    . BREAST SURGERY    . CHOLECYSTECTOMY    . EYE SURGERY    . ORIF PATELLA Left 01/19/2016   Procedure: OPEN REDUCTION INTERNAL (ORIF) FIXATION LEFT PATELLA  FRACTURE;  Surgeon: Carole Civil, MD;  Location: [facility];  Service: Orthopedics;  Laterality: Left;    Family Psychiatric History: I have reviewed the patient's family history in detail and updated the patient record.  Family History: No family history on file.  Social History:  Social History   Socioeconomic History  . Marital status: Widowed    Spouse name: None  . Number of children: None  . Years of education: None  . Highest education level: None  Social Needs  . Financial resource strain: None  . Food insecurity - worry: None  . Food insecurity - inability: None  . Transportation needs - medical: None  . Transportation needs - non-medical: None  Occupational History  . None  Tobacco Use  . Smoking status: Never Smoker  . Smokeless tobacco: Never Used  Substance and Sexual Activity  . Alcohol use: No  . Drug use: No  . Sexual activity: Not Currently  Other Topics Concern  . None  Social History Narrative  . None    Allergies:  Allergies  Allergen Reactions  . Penicillins     Unknown-childhood allergy    Metabolic Disorder Labs: No  results found for: HGBA1C, MPG No results found for: PROLACTIN No results found for: CHOL, TRIG, HDL, CHOLHDL, VLDL, LDLCALC No results found for: TSH  Therapeutic Level Labs: No results found for: LITHIUM No results found for: VALPROATE No components found for:  CBMZ  Current Medications: Current Outpatient Medications  Medication Sig Dispense Refill  . alendronate (FOSAMAX) 70 MG tablet Take 70 mg by mouth once a week. Take with a full glass of water on an empty stomach.Takes  on Monday    . atorvastatin (LIPITOR) 20 MG tablet Take 20 mg by mouth daily.    . Calcium Carb-Cholecalciferol (CALCIUM 600 + D PO) Take 1 tablet by mouth daily.    . clonazePAM (KLONOPIN) 1 MG tablet Take 1 mg daily and 0.5 mg at night 45 tablet 2  . dexlansoprazole (DEXILANT) 60 MG capsule Take 60 mg by mouth daily.    . diphenoxylate-atropine (LOMOTIL) 2.5-0.025 MG tablet Take 1 tablet by mouth 4 (four) times daily as needed for diarrhea or loose stools.    . docusate sodium (COLACE) 100 MG capsule Take 1 capsule (100 mg total) by mouth 2 (two) times daily as needed for mild constipation. 20 capsule 0  . donepezil (ARICEPT) 5 MG tablet Take 1 tablet (5 mg total) by mouth at bedtime. 90 tablet 0  . fexofenadine (ALLEGRA) 180 MG tablet Take 180 mg by mouth daily.    . Fluticasone-Salmeterol (ADVAIR) 250-50 MCG/DOSE AEPB Inhale 1 puff into the lungs daily.    Marland Kitchen gabapentin (NEURONTIN) 100 MG capsule Take 100 mg by mouth 2 (two) times daily.    . hydrochlorothiazide (HYDRODIURIL) 25 MG tablet Take 25 mg by mouth daily.    Marland Kitchen latanoprost (XALATAN) 0.005 % ophthalmic solution Place 1 drop into both eyes at bedtime.    Marland Kitchen loperamide (IMODIUM) 2 MG capsule Take 2 mg by mouth as needed for diarrhea or loose stools.    . meclizine (ANTIVERT) 25 MG tablet Take 25 mg by mouth 3 (three) times daily as needed for dizziness.    . promethazine (PHENERGAN) 25 MG tablet Take 25 mg by mouth every 6 (six) hours as needed for nausea or vomiting.    . propranolol (INDERAL) 60 MG tablet Take 60 mg by mouth 2 (two) times daily.    . timolol (BETIMOL) 0.5 % ophthalmic solution Place 1 drop into the right eye daily.    Marland Kitchen triamcinolone cream (KENALOG) 0.1 % Apply 1 application topically daily.    Marland Kitchen venlafaxine XR (EFFEXOR-XR) 150 MG 24 hr capsule Take 1 capsule (150 mg total) by mouth daily with breakfast. 90 capsule 0   No current facility-administered medications for this visit.      Musculoskeletal: Strength &  Muscle Tone: within normal limits Gait & Station: normal Patient leans: N/A  Psychiatric Specialty Exam: Review of Systems  Psychiatric/Behavioral: Positive for depression and memory loss. Negative for hallucinations, substance abuse and suicidal ideas. The patient is nervous/anxious. The patient does not have insomnia.   All other systems reviewed and are negative.   Blood pressure (!) 169/70, pulse (!) 54, height 5' 0.5" (1.537 m), weight 150 lb (68 kg), SpO2 95 %.Body mass index is 28.81 kg/m.  General Appearance: Fairly Groomed  Eye Contact:  Good  Speech:  Clear and Coherent  Volume:  Normal  Mood:  Anxious  Affect:  Appropriate, Congruent and smiles  Thought Process:  Coherent, irrelevant at times  Orientation:  Full (Time, Place, and Person)  Thought Content: denies paranoia  Suicidal Thoughts:  No  Homicidal Thoughts:  No  Memory:  Immediate;   Fair  Judgement:  Fair  Insight:  Shallow  Psychomotor Activity:  Normal  Concentration:  Concentration: Good and Attention Span: Good  Recall:  Poor  Fund of Knowledge: Good  Language: Good  Akathisia:  No  Handed:  Right  AIMS (if indicated): not done  Assets:  Communication Skills Desire for Improvement  ADL's:  Intact  Cognition: WNL  Sleep:  Fair   Screenings: MOCA 11 on 05/2017. : +2 for drawing clock,+2 for naming, +2 for attention, +5 for orientation   Assessment and Plan:  Bonnie Rush is a 84 y.o. year old female with a history of depression, neurocognitive disorder, hypertension, hyperlipidemia, irritable bowel syndrome, left patella fracture s/p ORIF , who presents for follow up appointment for Moderate episode of recurrent major depressive disorder ([place])  Major neurocognitive disorder  # MDD # Unspecified anxiety disorder There has been overall improvement in anxiety since she has restarted Effexor.  Will continue current dose to target depression and anxiety while monitoring blood pressure (she states  that her regular BP is lower).  Will continue clonazepam as needed for anxiety. Although it is preferable to avoid this medication given its risk, both the patient and her daughter requests this as they have seen significant benefit from this medication. Discussed risk of dependence and fall.   Noted that there is a significant concern about medication adherence. Reviewed each of her medication.  Her daughter agrees to take care of her medication to avoid any accidental overdose.   # Major neurocognitive disorder- mild Patient continues to have memory loss.  She has not started Aricept yet; she agrees to start this medication.  Discussed potential GI side effect she is advised to discontinue medication if any worsening in her GI symptoms given she has irritable bowel syndrome.   Plan .I have reviewed and updated plans as below 1. Continue venlafaxine 150 mg daily 2. Continue clonazepam 1 mg during the day and 0.5 mg at night (refill after 3/20. Will not do 90 days given concern for adherence) 3. Start Aricept 5 mg daily  4. Return to clinic in three months for 30 mins  Treatment Plan Summary: Plan as above  The patient demonstrates the following risk factors for suicide: Chronic risk factorsfor suicide include psychiatric disorder, demographic factors ( >65 yo). Acute risk factorsfor suicide includenone.Protective factorsfor this patient include positive social support,hope for the future. Considering these factors, the overall suicide risk at this point appears to be low.  The duration of this appointment visit was 30 minutes of face-to-face time with the patient.  Greater than 50% of this time was spent in counseling, explanation of  diagnosis, planning of further management, and coordination of care.  Norman Clay, MD 11/12/2017, 4:53 PM

## 2017-11-12 ENCOUNTER — Encounter (HOSPITAL_COMMUNITY): Payer: Self-pay | Admitting: Psychiatry

## 2017-11-12 ENCOUNTER — Ambulatory Visit (INDEPENDENT_AMBULATORY_CARE_PROVIDER_SITE_OTHER): Payer: Medicare Other | Admitting: Psychiatry

## 2017-11-12 VITALS — BP 169/70 | HR 54 | Ht 60.5 in | Wt 150.0 lb

## 2017-11-12 DIAGNOSIS — F419 Anxiety disorder, unspecified: Secondary | ICD-10-CM

## 2017-11-12 DIAGNOSIS — F0150 Vascular dementia without behavioral disturbance: Secondary | ICD-10-CM

## 2017-11-12 DIAGNOSIS — F0390 Unspecified dementia without behavioral disturbance: Secondary | ICD-10-CM

## 2017-11-12 DIAGNOSIS — R450 Nervousness: Secondary | ICD-10-CM | POA: Diagnosis not present

## 2017-11-12 DIAGNOSIS — F331 Major depressive disorder, recurrent, moderate: Secondary | ICD-10-CM | POA: Diagnosis not present

## 2017-11-12 DIAGNOSIS — R413 Other amnesia: Secondary | ICD-10-CM

## 2017-11-12 MED ORDER — DONEPEZIL HCL 5 MG PO TABS: 5.0000 mg | ORAL_TABLET | Freq: Every day | ORAL | 0 refills | Status: DC

## 2017-11-12 MED ORDER — VENLAFAXINE HCL ER 150 MG PO CP24: 150.0000 mg | ORAL_CAPSULE | Freq: Every day | ORAL | 0 refills | Status: DC

## 2017-11-12 MED ORDER — CLONAZEPAM 1 MG PO TABS: ORAL_TABLET | ORAL | 2 refills | Status: DC

## 2017-11-12 NOTE — Patient Instructions (Signed)
1. Continue venlafaxine 150 mg daily 2. Continue clonazepam 1 mg during the day and 0.5 mg at night 3. Start Aricept 5 mg daily  4. Return to clinic in three months for 30 mins

## 2017-11-26 DIAGNOSIS — Z299 Encounter for prophylactic measures, unspecified: Secondary | ICD-10-CM | POA: Diagnosis not present

## 2017-11-26 DIAGNOSIS — Z6828 Body mass index (BMI) 28.0-28.9, adult: Secondary | ICD-10-CM | POA: Diagnosis not present

## 2017-11-26 DIAGNOSIS — Z1339 Encounter for screening examination for other mental health and behavioral disorders: Secondary | ICD-10-CM | POA: Diagnosis not present

## 2017-11-26 DIAGNOSIS — I10 Essential (primary) hypertension: Secondary | ICD-10-CM | POA: Diagnosis not present

## 2017-11-26 DIAGNOSIS — Z1331 Encounter for screening for depression: Secondary | ICD-10-CM | POA: Diagnosis not present

## 2017-11-26 DIAGNOSIS — R5383 Other fatigue: Secondary | ICD-10-CM | POA: Diagnosis not present

## 2017-11-26 DIAGNOSIS — Z1211 Encounter for screening for malignant neoplasm of colon: Secondary | ICD-10-CM | POA: Diagnosis not present

## 2017-11-26 DIAGNOSIS — Z79899 Other long term (current) drug therapy: Secondary | ICD-10-CM | POA: Diagnosis not present

## 2017-11-26 DIAGNOSIS — J309 Allergic rhinitis, unspecified: Secondary | ICD-10-CM | POA: Diagnosis not present

## 2017-11-26 DIAGNOSIS — E559 Vitamin D deficiency, unspecified: Secondary | ICD-10-CM | POA: Diagnosis not present

## 2017-11-26 DIAGNOSIS — Z7189 Other specified counseling: Secondary | ICD-10-CM | POA: Diagnosis not present

## 2017-11-26 DIAGNOSIS — Z0000 Encounter for general adult medical examination without abnormal findings: Secondary | ICD-10-CM | POA: Diagnosis not present

## 2017-11-27 DIAGNOSIS — R350 Frequency of micturition: Secondary | ICD-10-CM | POA: Diagnosis not present

## 2018-01-23 DIAGNOSIS — M159 Polyosteoarthritis, unspecified: Secondary | ICD-10-CM | POA: Diagnosis not present

## 2018-01-23 DIAGNOSIS — J449 Chronic obstructive pulmonary disease, unspecified: Secondary | ICD-10-CM | POA: Diagnosis not present

## 2018-01-23 DIAGNOSIS — I10 Essential (primary) hypertension: Secondary | ICD-10-CM | POA: Diagnosis not present

## 2018-01-30 DIAGNOSIS — H35371 Puckering of macula, right eye: Secondary | ICD-10-CM | POA: Diagnosis not present

## 2018-01-30 DIAGNOSIS — H43811 Vitreous degeneration, right eye: Secondary | ICD-10-CM | POA: Diagnosis not present

## 2018-01-30 DIAGNOSIS — Z970 Presence of artificial eye: Secondary | ICD-10-CM | POA: Diagnosis not present

## 2018-01-30 DIAGNOSIS — Z961 Presence of intraocular lens: Secondary | ICD-10-CM | POA: Diagnosis not present

## 2018-01-30 DIAGNOSIS — H401112 Primary open-angle glaucoma, right eye, moderate stage: Secondary | ICD-10-CM | POA: Diagnosis not present

## 2018-02-04 NOTE — Progress Notes (Signed)
BH MD/PA/NP OP Progress Note  02/09/2018 2:37 PM Bonnie Rush  MRN:  [id number]  Chief Complaint:  Chief Complaint    Depression; Anxiety; Follow-up     HPI:  Patient presents late for follow-up appointment for depression, accompanied by her daughter. She asks clonazepam to be increased as she feels nervous. On other occasion, she states that she asks to continue the current dose of clonazepam, then later reports that 0.5 mg does not work for anxiety. She has been going to the appointment for eye glass, hearing loss, and she had her hair cut. Although she occasionally feels depressed, she tries not to think about it. She talks about her pet, who died several years ago. She discontinued aricept after a few days of trial as she had VH of seeing somebody in her room. She experienced similar event when she was on Xanax. She had a good mother's day and Easter. She has insomnia. She feels fatigue at times. She has good appetite. She denies SI. She denies panic attacks. She denies any difficulty in ADL.    Wt Readings from Last 3 Encounters:  02/09/18 150 lb (68 kg)  11/12/17 150 lb (68 kg)  09/11/17 147 lb (66.7 kg)    Per PMP,  Clonazepam last filled on 01/31/2018 I have utilized the [place] Controlled Substances Reporting System (PMP [place]) to confirm adherence regarding the patient's medication. My review reveals appropriate prescription fills.   Visit Diagnosis:    ICD-10-CM   1. Moderate episode of recurrent major depressive disorder (HCC) F33.1   2. Major neurocognitive disorder F01.50     Past Psychiatric History:  I have reviewed the patient's psychiatry history in detail and updated the patient record. Outpatient:seen a psychiatrist years ago Psychiatry admission:denies Previous suicide attempt:denies Past trials of medication:Venlafaxine, clonazepam, Xanax (AH, VH, paranoia), Trazodone (palpitation), aricept History of violence:denies   Past Medical History:  Past  Medical History:  Diagnosis Date  . Glaucoma   . High cholesterol   . Hypertension   . Irritable bowel syndrome   . Osteoporosis   . Palpitations     Past Surgical History:  Procedure Laterality Date  . ABDOMINAL HYSTERECTOMY    . BREAST SURGERY    . CHOLECYSTECTOMY    . EYE SURGERY    . ORIF PATELLA Left 01/19/2016   Procedure: OPEN REDUCTION INTERNAL (ORIF) FIXATION LEFT PATELLA  FRACTURE;  Surgeon: Vickki Hearing, MD;  Location: [facility];  Service: Orthopedics;  Laterality: Left;    Family Psychiatric History: I have reviewed the patient's family history in detail and updated the patient record.  Family History: No family history on file.  Social History:  Social History   Socioeconomic History  . Marital status: Widowed    Spouse name: Not on file  . Number of children: Not on file  . Years of education: Not on file  . Highest education level: Not on file  Occupational History  . Not on file  Social Needs  . Financial resource strain: Not on file  . Food insecurity:    Worry: Not on file    Inability: Not on file  . Transportation needs:    Medical: Not on file    Non-medical: Not on file  Tobacco Use  . Smoking status: Never Smoker  . Smokeless tobacco: Never Used  Substance and Sexual Activity  . Alcohol use: No  . Drug use: No  . Sexual activity: Not Currently  Lifestyle  . Physical activity:  Days per week: Not on file    Minutes per session: Not on file  . Stress: Not on file  Relationships  . Social connections:    Talks on phone: Not on file    Gets together: Not on file    Attends religious service: Not on file    Active member of club or organization: Not on file    Attends meetings of clubs or organizations: Not on file    Relationship status: Not on file  Other Topics Concern  . Not on file  Social History Narrative  . Not on file    Allergies:  Allergies  Allergen Reactions  . Penicillins     Unknown-childhood allergy     Metabolic Disorder Labs: No results found for: HGBA1C, MPG No results found for: PROLACTIN No results found for: CHOL, TRIG, HDL, CHOLHDL, VLDL, LDLCALC No results found for: TSH  Therapeutic Level Labs: No results found for: LITHIUM No results found for: VALPROATE No components found for:  CBMZ  Current Medications: Current Outpatient Medications  Medication Sig Dispense Refill  . alendronate (FOSAMAX) 70 MG tablet Take 70 mg by mouth once a week. Take with a full glass of water on an empty stomach.Takes on Monday    . atorvastatin (LIPITOR) 20 MG tablet Take 20 mg by mouth daily.    . Calcium Carb-Cholecalciferol (CALCIUM 600 + D PO) Take 1 tablet by mouth daily.    Melene Muller ON 03/03/2018] clonazePAM (KLONOPIN) 1 MG tablet Take 1 mg daily and 0.5 mg at night 45 tablet 3  . dexlansoprazole (DEXILANT) 60 MG capsule Take 60 mg by mouth daily.    . diphenoxylate-atropine (LOMOTIL) 2.5-0.025 MG tablet Take 1 tablet by mouth 4 (four) times daily as needed for diarrhea or loose stools.    . docusate sodium (COLACE) 100 MG capsule Take 1 capsule (100 mg total) by mouth 2 (two) times daily as needed for mild constipation. 20 capsule 0  . fexofenadine (ALLEGRA) 180 MG tablet Take 180 mg by mouth daily.    . Fluticasone-Salmeterol (ADVAIR) 250-50 MCG/DOSE AEPB Inhale 1 puff into the lungs daily.    Marland Kitchen gabapentin (NEURONTIN) 100 MG capsule Take 100 mg by mouth 2 (two) times daily.    . hydrochlorothiazide (HYDRODIURIL) 25 MG tablet Take 25 mg by mouth daily.    Marland Kitchen latanoprost (XALATAN) 0.005 % ophthalmic solution Place 1 drop into both eyes at bedtime.    Marland Kitchen loperamide (IMODIUM) 2 MG capsule Take 2 mg by mouth as needed for diarrhea or loose stools.    . meclizine (ANTIVERT) 25 MG tablet Take 25 mg by mouth 3 (three) times daily as needed for dizziness.    . promethazine (PHENERGAN) 25 MG tablet Take 25 mg by mouth every 6 (six) hours as needed for nausea or vomiting.    . propranolol (INDERAL)  60 MG tablet Take 60 mg by mouth 2 (two) times daily.    . timolol (BETIMOL) 0.5 % ophthalmic solution Place 1 drop into the right eye daily.    Marland Kitchen triamcinolone cream (KENALOG) 0.1 % Apply 1 application topically daily.    Marland Kitchen venlafaxine XR (EFFEXOR-XR) 150 MG 24 hr capsule Take 1 capsule (150 mg total) by mouth daily with breakfast. 90 capsule 1   No current facility-administered medications for this visit.      Musculoskeletal: Strength & Muscle Tone: decreased Gait & Station: unsteady- uses a walker Patient leans: N/A  Psychiatric Specialty Exam: Review of Systems  Psychiatric/Behavioral:  Positive for depression and memory loss. Negative for hallucinations, substance abuse and suicidal ideas. The patient is nervous/anxious and has insomnia.   All other systems reviewed and are negative.   Blood pressure 113/73, pulse 75, height 5' 0.5" (1.537 m), weight 150 lb (68 kg), SpO2 93 %.Body mass index is 28.81 kg/m.  General Appearance: Fairly Groomed  Eye Contact:  Good  Speech:  Clear and Coherent  Volume:  Normal  Mood:  Depressed  Affect:  Appropriate, Congruent and down at times, but reactive. Smiles at the end of the interview  Thought Process:  Coherent, occasionally irrelevant, illogical  Orientation:  Full (Time, Place, and Person)  Thought Content: Rumination   Suicidal Thoughts:  No  Homicidal Thoughts:  No  Memory:  Immediate;   Fair  Judgement:  Fair  Insight:  Present  Psychomotor Activity:  Normal  Concentration:  Concentration: Good and Attention Span: Good  Recall:  Good  Fund of Knowledge: Good  Language: Good  Akathisia:  No  Handed:  Right  AIMS (if indicated): not done  Assets:  Communication Skills Desire for Improvement  ADL's:  Intact  Cognition: WNL  Sleep:  Poor   Screenings: MOCA 11 on 05/2017. : +2 for drawing clock,+2 for naming, +2 for attention, +5 for orientation   Assessment and Plan:  Bonnie Rush is a 84 y.o. year old female with a  history of depression, neurocognitive disorder,hypertension,hyperlipidemia, irritable bowel syndrome, left patella fracture s/p ORIF , who presents for follow up appointment for Moderate episode of recurrent major depressive disorder (HCC)  Major neurocognitive disorder  # MDD # Unspecified anxiety disorder Patient reports overall improvement in mood symptoms with less rumination on clonazepam.  Will continue Effexor to target depression and anxiety.  Will continue clonazepam as needed for anxiety.  Although it has been strongly advised to avoid this medication given its risk of falls, both the patient and her daughter requests it to be continued as she has significant benefit from this medication. Discussed risks including but not limited to falls, oversedation and independence. Given concern for adherence, will order with refills.  # Major neurocognitive disorder- mild Patient does have neurocognitive deficits as in MOCA. She had adverse reaction of VH while on Aricept. Will discontinue this medication.   Plan I have reviewed and updated plans as below 1. Continue venlafaxine 150 mg daily 2. Continue clonazepam 1 mg during the day and 0.5 mg at night (refill after 3/20. Will not do 90 days given concern for adherence) 3. Discontinue aricept 4. Return to clinic in four months for 30 mins  The patient demonstrates the following risk factors for suicide: Chronic risk factorsfor suicide include psychiatric disorder, demographic factors ( >55 yo). Acute risk factorsfor suicide includenone.Protective factorsfor this patient include positive social support,hope for the future. Considering these factors, the overall suicide risk at this point appears to be low.  The duration of this appointment visit was 30 minutes of face-to-face time with the patient.  Greater than 50% of this time was spent in counseling, explanation of  diagnosis, planning of further management, and coordination of  care.  [name], MD 02/09/2018, 2:37 PM

## 2018-02-09 ENCOUNTER — Encounter (HOSPITAL_COMMUNITY): Payer: Self-pay | Admitting: Psychiatry

## 2018-02-09 ENCOUNTER — Ambulatory Visit (INDEPENDENT_AMBULATORY_CARE_PROVIDER_SITE_OTHER): Payer: Medicare Other | Admitting: Psychiatry

## 2018-02-09 VITALS — BP 113/73 | HR 75 | Ht 60.5 in | Wt 150.0 lb

## 2018-02-09 DIAGNOSIS — F0150 Vascular dementia without behavioral disturbance: Secondary | ICD-10-CM

## 2018-02-09 DIAGNOSIS — G3184 Mild cognitive impairment, so stated: Secondary | ICD-10-CM | POA: Diagnosis not present

## 2018-02-09 DIAGNOSIS — G4700 Insomnia, unspecified: Secondary | ICD-10-CM

## 2018-02-09 DIAGNOSIS — R450 Nervousness: Secondary | ICD-10-CM | POA: Diagnosis not present

## 2018-02-09 DIAGNOSIS — F331 Major depressive disorder, recurrent, moderate: Secondary | ICD-10-CM

## 2018-02-09 DIAGNOSIS — F419 Anxiety disorder, unspecified: Secondary | ICD-10-CM | POA: Diagnosis not present

## 2018-02-09 DIAGNOSIS — F0390 Unspecified dementia without behavioral disturbance: Secondary | ICD-10-CM

## 2018-02-09 MED ORDER — CLONAZEPAM 1 MG PO TABS: ORAL_TABLET | ORAL | 3 refills | Status: DC

## 2018-02-09 MED ORDER — VENLAFAXINE HCL ER 150 MG PO CP24: 150.0000 mg | ORAL_CAPSULE | Freq: Every day | ORAL | 1 refills | Status: DC

## 2018-02-09 NOTE — Patient Instructions (Signed)
1. Continue venlafaxine 150 mg daily 2. Continue clonazepam 1 mg during the day and 0.5 mg at night  3. Discontinue aricept 4. Return to clinic in four months for 30 mins

## 2018-02-25 DIAGNOSIS — M159 Polyosteoarthritis, unspecified: Secondary | ICD-10-CM | POA: Diagnosis not present

## 2018-02-25 DIAGNOSIS — J449 Chronic obstructive pulmonary disease, unspecified: Secondary | ICD-10-CM | POA: Diagnosis not present

## 2018-02-25 DIAGNOSIS — I10 Essential (primary) hypertension: Secondary | ICD-10-CM | POA: Diagnosis not present

## 2018-03-04 DIAGNOSIS — J449 Chronic obstructive pulmonary disease, unspecified: Secondary | ICD-10-CM | POA: Diagnosis not present

## 2018-03-04 DIAGNOSIS — Z299 Encounter for prophylactic measures, unspecified: Secondary | ICD-10-CM | POA: Diagnosis not present

## 2018-03-04 DIAGNOSIS — Z713 Dietary counseling and surveillance: Secondary | ICD-10-CM | POA: Diagnosis not present

## 2018-03-04 DIAGNOSIS — I10 Essential (primary) hypertension: Secondary | ICD-10-CM | POA: Diagnosis not present

## 2018-03-04 DIAGNOSIS — Z6828 Body mass index (BMI) 28.0-28.9, adult: Secondary | ICD-10-CM | POA: Diagnosis not present

## 2018-04-08 DIAGNOSIS — M159 Polyosteoarthritis, unspecified: Secondary | ICD-10-CM | POA: Diagnosis not present

## 2018-04-08 DIAGNOSIS — J449 Chronic obstructive pulmonary disease, unspecified: Secondary | ICD-10-CM | POA: Diagnosis not present

## 2018-04-08 DIAGNOSIS — I10 Essential (primary) hypertension: Secondary | ICD-10-CM | POA: Diagnosis not present

## 2018-04-11 ENCOUNTER — Emergency Department (HOSPITAL_COMMUNITY): Payer: Medicare Other

## 2018-04-11 ENCOUNTER — Encounter (HOSPITAL_COMMUNITY): Payer: Self-pay | Admitting: Emergency Medicine

## 2018-04-11 ENCOUNTER — Other Ambulatory Visit: Payer: Self-pay

## 2018-04-11 ENCOUNTER — Emergency Department (HOSPITAL_COMMUNITY)
Admission: EM | Admit: 2018-04-11 | Discharge: 2018-04-11 | Disposition: A | Discharge status: Home / Self Care | Payer: Medicare Other | Attending: Emergency Medicine | Admitting: Emergency Medicine

## 2018-04-11 DIAGNOSIS — I10 Essential (primary) hypertension: Secondary | ICD-10-CM | POA: Diagnosis not present

## 2018-04-11 DIAGNOSIS — Z79899 Other long term (current) drug therapy: Secondary | ICD-10-CM | POA: Insufficient documentation

## 2018-04-11 DIAGNOSIS — H5789 Other specified disorders of eye and adnexa: Secondary | ICD-10-CM | POA: Insufficient documentation

## 2018-04-11 DIAGNOSIS — H5712 Ocular pain, left eye: Secondary | ICD-10-CM | POA: Diagnosis present

## 2018-04-11 DIAGNOSIS — L03213 Periorbital cellulitis: Secondary | ICD-10-CM | POA: Insufficient documentation

## 2018-04-11 DIAGNOSIS — E876 Hypokalemia: Secondary | ICD-10-CM | POA: Insufficient documentation

## 2018-04-11 DIAGNOSIS — J329 Chronic sinusitis, unspecified: Secondary | ICD-10-CM | POA: Diagnosis not present

## 2018-04-11 LAB — BASIC METABOLIC PANEL
ANION GAP: 8 (ref 5–15)
BUN: 17 mg/dL (ref 8–23)
CHLORIDE: 99 mmol/L (ref 98–111)
CO2: 29 mmol/L (ref 22–32)
Calcium: 9.9 mg/dL (ref 8.9–10.3)
Creatinine, Ser: 1.11 mg/dL — ABNORMAL HIGH (ref 0.44–1.00)
GFR calc non Af Amer: 45 mL/min — ABNORMAL LOW (ref >60)
GFR, EST AFRICAN AMERICAN: 52 mL/min — AB (ref >60)
Glucose, Bld: 119 mg/dL — ABNORMAL HIGH (ref 70–99)
Potassium: 2.8 mmol/L — ABNORMAL LOW (ref 3.5–5.1)
Sodium: 136 mmol/L (ref 135–145)

## 2018-04-11 LAB — CBC
HEMATOCRIT: 36.4 % (ref 36.0–46.0)
HEMOGLOBIN: 11.9 g/dL — AB (ref 12.0–15.0)
MCH: 30.4 pg (ref 26.0–34.0)
MCHC: 32.7 g/dL (ref 30.0–36.0)
MCV: 92.9 fL (ref 78.0–100.0)
Platelets: 285 10*3/uL (ref 150–400)
RBC: 3.92 MIL/uL (ref 3.87–5.11)
RDW: 12.7 % (ref 11.5–15.5)
WBC: 7.6 10*3/uL (ref 4.0–10.5)

## 2018-04-11 MED ORDER — SODIUM CHLORIDE 0.9 % IV SOLN
1.0000 g | Freq: Once | INTRAVENOUS | Status: AC
  Administered 2018-04-11: 1 g via INTRAVENOUS
  Filled 2018-04-11: qty 10

## 2018-04-11 MED ORDER — IOPAMIDOL (ISOVUE-300) INJECTION 61%: 75.0000 mL | Freq: Once | INTRAVENOUS | Status: DC | PRN

## 2018-04-11 MED ORDER — POTASSIUM CHLORIDE 10 MEQ/100ML IV SOLN
10.0000 meq | Freq: Once | INTRAVENOUS | Status: AC
  Administered 2018-04-11: 10 meq via INTRAVENOUS
  Filled 2018-04-11: qty 100

## 2018-04-11 MED ORDER — POTASSIUM CHLORIDE CRYS ER 20 MEQ PO TBCR
40.0000 meq | EXTENDED_RELEASE_TABLET | Freq: Once | ORAL | Status: AC
  Administered 2018-04-11: 40 meq via ORAL
  Filled 2018-04-11: qty 2

## 2018-04-11 MED ORDER — CEFPODOXIME PROXETIL 200 MG PO TABS: 200.0000 mg | ORAL_TABLET | Freq: Two times a day (BID) | ORAL | 0 refills | Status: DC

## 2018-04-11 MED ORDER — IOHEXOL 300 MG/ML  SOLN
75.0000 mL | Freq: Once | INTRAMUSCULAR | Status: AC | PRN
  Administered 2018-04-11: 60 mL via INTRAVENOUS

## 2018-04-11 MED ORDER — POTASSIUM CHLORIDE ER 10 MEQ PO TBCR: 10.0000 meq | EXTENDED_RELEASE_TABLET | Freq: Two times a day (BID) | ORAL | 0 refills | Status: AC

## 2018-04-11 NOTE — Discharge Instructions (Addendum)
Follow up with Dr Dione BoozeGroat later this week to be rechecked, monitor for fever, worsening symptoms, follow up with your doctor to have the potassium level rechecked

## 2018-04-11 NOTE — ED Provider Notes (Signed)
[facility] Provider Note   CSN: [id number] Arrival date & time: 04/11/18  1720     History   Chief Complaint Chief Complaint  Patient presents with  . Eye Pain    HPI Bonnie Rush is a 84 y.o. female.  HPI Patient presents to the emergency room for evaluation of left eye redness and drainage.  Patient's left eye was surgically removed approximately 10 years ago.  She wears a prosthesis in her left eye.  Today when the family members went to visit they noted that the patient was rubbing her eye.  They Saw purulent drainage coming out from around the prosthesis.  Patient had taken the prosthesis out and when she attempted to put it back in seemed like it popped out due to pressure.  Family members called the ophthalmologist, Dr. Dione BoozeGroat who recommended she come to the emergency room to get a CT scan.  Patient's had some mild URI symptoms but no fevers.  No vomiting or diarrhea.  She denies any pain or soreness right now Past Medical History:  Diagnosis Date  . Glaucoma   . High cholesterol   . Hypertension   . Irritable bowel syndrome   . Osteoporosis   . Palpitations     Patient Active Problem List   Diagnosis Date Noted  . Major neurocognitive disorder 06/12/2017  . Moderate episode of recurrent major depressive disorder (HCC) 06/20/2016  . Left patella fracture 01/19/2016  . Closed fracture of left patella   . Patella fracture 01/18/2016    Past Surgical History:  Procedure Laterality Date  . ABDOMINAL HYSTERECTOMY    . BREAST SURGERY    . CHOLECYSTECTOMY    . EYE SURGERY    . ORIF PATELLA Left 01/19/2016   Procedure: OPEN REDUCTION INTERNAL (ORIF) FIXATION LEFT PATELLA  FRACTURE;  Surgeon: Vickki HearingStanley E Harrison, MD;  Location: [facility];  Service: Orthopedics;  Laterality: Left;     OB History   None      Home Medications    Prior to Admission medications   Medication Sig Start Date End Date Taking? Authorizing Provider  atorvastatin  (LIPITOR) 20 MG tablet Take 20 mg by mouth every morning.    Yes [provider]  Calcium Carb-Cholecalciferol (CALCIUM 600 + D PO) Take 1 tablet by mouth daily.   Yes [provider]  clonazePAM (KLONOPIN) 1 MG tablet Take 1 mg daily and 0.5 mg at night Patient taking differently: Take 0.5-1 mg by mouth See admin instructions. Take 1 mg daily and 0.5 mg at night 03/03/18  Yes Hisada, Reina, MD  dexlansoprazole (DEXILANT) 60 MG capsule Take 60 mg by mouth daily.   Yes [provider]  diphenoxylate-atropine (LOMOTIL) 2.5-0.025 MG tablet Take 1 tablet by mouth 4 (four) times daily as needed for diarrhea or loose stools.   Yes [provider]  docusate sodium (COLACE) 100 MG capsule Take 1 capsule (100 mg total) by mouth 2 (two) times daily as needed for mild constipation. 01/17/16  Yes Raeford RazorKohut, Stephen, MD  fexofenadine (ALLEGRA) 180 MG tablet Take 180 mg by mouth daily.   Yes [provider]  hydrochlorothiazide (HYDRODIURIL) 25 MG tablet Take 25 mg by mouth daily.   Yes [provider]  latanoprost (XALATAN) 0.005 % ophthalmic solution Place 1 drop into both eyes at bedtime.   Yes [provider]  loperamide (IMODIUM) 2 MG capsule Take 2 mg by mouth as needed for diarrhea or loose stools.   Yes  [provider]  meclizine (ANTIVERT) 25 MG tablet Take 25 mg by mouth 3 (three) times daily.    Yes [provider]  promethazine (PHENERGAN) 25 MG tablet Take 25 mg by mouth every 6 (six) hours as needed for nausea or vomiting.   Yes [provider]  propranolol (INDERAL) 60 MG tablet Take 60 mg by mouth 2 (two) times daily.   Yes [provider]  timolol (BETIMOL) 0.5 % ophthalmic solution Place 1 drop into the right eye daily.   Yes [provider]  venlafaxine XR (EFFEXOR-XR) 150 MG 24 hr capsule Take 1 capsule (150 mg total) by mouth daily with breakfast. 02/09/18  Yes Hisada, Barbee Cough, MD  cefpodoxime  (VANTIN) 200 MG tablet Take 1 tablet (200 mg total) by mouth 2 (two) times daily. 04/11/18   Linwood Dibbles, MD  potassium chloride (K-DUR) 10 MEQ tablet Take 1 tablet (10 mEq total) by mouth 2 (two) times daily. 04/11/18   Linwood Dibbles, MD    Family History No family history on file.  Social History Social History   Tobacco Use  . Smoking status: Never Smoker  . Smokeless tobacco: Never Used  Substance Use Topics  . Alcohol use: No  . Drug use: No     Allergies   Penicillins   Review of Systems Review of Systems  All other systems reviewed and are negative.    Physical Exam Updated Vital Signs BP (!) 156/63   Pulse 62   Temp 98.9 F (37.2 C) (Oral)   Resp 20   Ht 1.575 m (5\' 2" )   Wt 69.4 kg (153 lb)   SpO2 92%   BMI 27.98 kg/m   Physical Exam  Constitutional: She appears well-developed and well-nourished. No distress.  HENT:  Head: Normocephalic and atraumatic.  Right Ear: External ear normal.  Left Ear: External ear normal.  Mouth/Throat: Oropharynx is clear and moist. No oropharyngeal exudate.  Eyes: Conjunctivae are normal. Right eye exhibits no discharge. Left eye exhibits no discharge. No scleral icterus.  No discharge noted in the left eye now, small amount of yellow crusted debris left side, no purulence noted in the eye socket, erythema of the left eye lid and periorbital region, no ttp  Neck: Neck supple. No tracheal deviation present.  Cardiovascular: Normal rate.  Pulmonary/Chest: Effort normal. No stridor. No respiratory distress.  Abdominal: She exhibits no distension.  Musculoskeletal: She exhibits no edema.  Neurological: She is alert. Cranial nerve deficit: no gross deficits.  Skin: Skin is warm and dry. No rash noted.  Psychiatric: She has a normal mood and affect.  Nursing note and vitals reviewed.    ED Treatments / Results  Labs (all labs ordered are listed, but only abnormal results are displayed) Labs Reviewed  CBC - Abnormal; Notable  for the following components:      Result Value   Hemoglobin 11.9 (*)    All other components within normal limits  BASIC METABOLIC PANEL - Abnormal; Notable for the following components:   Potassium 2.8 (*)    Glucose, Bld 119 (*)    Creatinine, Ser 1.11 (*)    GFR calc non Af Amer 45 (*)    GFR calc Af Amer 52 (*)    All other components within normal limits    EKG EKG Interpretation  Date/Time:  Saturday April 11 2018 19:21:57 EDT Ventricular Rate:  69 PR Interval:    QRS Duration: 84 QT Interval:  549 QTC Calculation: 589 R Axis:  36 Text Interpretation:  Sinus rhythm Borderline T abnormalities, diffuse leads Prolonged QT interval No old tracing to compare Confirmed by Linwood Dibbles [phone]) on 04/11/2018 7:29:06 PM   Radiology Ct Orbits W Contrast  Result Date: 04/11/2018 CLINICAL DATA:  LEFT eye prosthesis, suspected infection. EXAM: CT ORBITS WITH CONTRAST TECHNIQUE: Multidetector CT images was performed according to the standard protocol following intravenous contrast administration. CONTRAST:  60mL OMNIPAQUE IOHEXOL 300 MG/ML  SOLN COMPARISON:  CT head 11/04/2015. FINDINGS: Orbits: The native LEFT globe is radiodense, presumed vitreous hemorrhage. The orbital prosthesis which normally lies over the globe is not present currently. There is superficial cellulitic change of what can be best characterized as preseptal soft tissue. There is no visible LEFT orbital abscess or postseptal inflammation. RIGHT orbit negative. Visualized sinuses: Mild fluid in the sinuses. Significant filling of the RIGHT maxillary sinus, and mucosal thickening of the LEFT maxillary sinus. Mucosal thickening of the RIGHT frontal sinus. BILATERAL ethmoid fluid. BILATERAL sphenoid sinus mucosal thickening, some layering fluid on the LEFT. Soft tissues: Unremarkable. Limited intracranial: Atrophy.  Dolichoectatic vessels. IMPRESSION: LEFT preseptal periorbital superficial cellulitis of the soft tissues which  overlie the native radiodense LEFT globe. The prosthesis is currently not within the orbit. There is no postseptal inflammation or orbital abscess. Chronic pansinusitis, but no significant sinus pathology affecting the LEFT orbit. Electronically Signed   By: Elsie Stain M.D.   On: 04/11/2018 19:44    Procedures Procedures (including critical care time)  Medications Ordered in ED Medications  iopamidol (ISOVUE-300) 61 % injection 75 mL (has no administration in time range)  iohexol (OMNIPAQUE) 300 MG/ML solution 75 mL (60 mLs Intravenous Contrast Given 04/11/18 1908)  potassium chloride SA (K-DUR,KLOR-CON) CR tablet 40 mEq (40 mEq Oral Given 04/11/18 1949)  potassium chloride 10 mEq in 100 mL IVPB (0 mEq Intravenous Stopped 04/11/18 2050)  cefTRIAXone (ROCEPHIN) 1 g in sodium chloride 0.9 % 100 mL IVPB (0 g Intravenous Stopped 04/11/18 2057)     Initial Impression / Assessment and Plan / ED Course  I have reviewed the triage vital signs and the nursing notes.  Pertinent labs & imaging results that were available during my care of the patient were reviewed by me and considered in my medical decision making (see chart for details).  Clinical Course as of Apr 11 2114  Sat Apr 11, 2018  2016 CT scan without signs of abscess.  Potassium low.  Replacement ordered.    [JK]    Clinical Course User Index [JK] Linwood Dibbles, MD    Patient presented to the emergency room for evaluation of eye redness and concerns for possible abscess.  Patient CT scan does not show any evidence of orbital abscess.  She does have physical exam findings to suggest a mild periorbital cellulitis she is nontoxic and afebrile.  She was given a dose of Rocephin in the emergency room and tolerated it well.  I plan on discharging her home with a course of oral antibiotics and potassium replacement  Final Clinical Impressions(s) / ED Diagnoses   Final diagnoses:  Periorbital cellulitis of left eye  Hypokalemia    ED  Discharge Orders        Ordered    potassium chloride (K-DUR) 10 MEQ tablet  2 times daily     04/11/18 2105    cefpodoxime (VANTIN) 200 MG tablet  2 times daily     04/11/18 2114       Linwood Dibbles, MD 04/11/18 2115

## 2018-04-11 NOTE — ED Triage Notes (Signed)
Pt c/o of left eye pain.  Pt has a prosthetic eye in the left eye.  Pt's daughter states that pt prosthetic eye "popped" out of patients socket due to pressure from pus. Pt's daughter stated that patient had yellow pus draining out her eye and pt then placed prosthetic eye back in her eye.  Redness and swelling noted to eye.

## 2018-04-14 DIAGNOSIS — H10022 Other mucopurulent conjunctivitis, left eye: Secondary | ICD-10-CM | POA: Diagnosis not present

## 2018-04-14 DIAGNOSIS — H43811 Vitreous degeneration, right eye: Secondary | ICD-10-CM | POA: Diagnosis not present

## 2018-04-14 DIAGNOSIS — Z961 Presence of intraocular lens: Secondary | ICD-10-CM | POA: Diagnosis not present

## 2018-04-14 DIAGNOSIS — H35371 Puckering of macula, right eye: Secondary | ICD-10-CM | POA: Diagnosis not present

## 2018-04-14 DIAGNOSIS — H401112 Primary open-angle glaucoma, right eye, moderate stage: Secondary | ICD-10-CM | POA: Diagnosis not present

## 2018-04-14 DIAGNOSIS — Z970 Presence of artificial eye: Secondary | ICD-10-CM | POA: Diagnosis not present

## 2018-04-21 DIAGNOSIS — E876 Hypokalemia: Secondary | ICD-10-CM | POA: Diagnosis not present

## 2018-04-21 DIAGNOSIS — F419 Anxiety disorder, unspecified: Secondary | ICD-10-CM | POA: Diagnosis not present

## 2018-04-21 DIAGNOSIS — H44009 Unspecified purulent endophthalmitis, unspecified eye: Secondary | ICD-10-CM | POA: Diagnosis not present

## 2018-04-21 DIAGNOSIS — Z6829 Body mass index (BMI) 29.0-29.9, adult: Secondary | ICD-10-CM | POA: Diagnosis not present

## 2018-04-21 DIAGNOSIS — I10 Essential (primary) hypertension: Secondary | ICD-10-CM | POA: Diagnosis not present

## 2018-04-21 DIAGNOSIS — L03213 Periorbital cellulitis: Secondary | ICD-10-CM | POA: Diagnosis not present

## 2018-04-21 DIAGNOSIS — Z299 Encounter for prophylactic measures, unspecified: Secondary | ICD-10-CM | POA: Diagnosis not present

## 2018-04-22 DIAGNOSIS — Z961 Presence of intraocular lens: Secondary | ICD-10-CM | POA: Diagnosis not present

## 2018-04-22 DIAGNOSIS — H10022 Other mucopurulent conjunctivitis, left eye: Secondary | ICD-10-CM | POA: Diagnosis not present

## 2018-04-22 DIAGNOSIS — Z970 Presence of artificial eye: Secondary | ICD-10-CM | POA: Diagnosis not present

## 2018-06-01 DIAGNOSIS — Z961 Presence of intraocular lens: Secondary | ICD-10-CM | POA: Diagnosis not present

## 2018-06-01 DIAGNOSIS — Z970 Presence of artificial eye: Secondary | ICD-10-CM | POA: Diagnosis not present

## 2018-06-01 DIAGNOSIS — H10022 Other mucopurulent conjunctivitis, left eye: Secondary | ICD-10-CM | POA: Diagnosis not present

## 2018-06-01 DIAGNOSIS — H43811 Vitreous degeneration, right eye: Secondary | ICD-10-CM | POA: Diagnosis not present

## 2018-06-01 DIAGNOSIS — H401112 Primary open-angle glaucoma, right eye, moderate stage: Secondary | ICD-10-CM | POA: Diagnosis not present

## 2018-06-01 DIAGNOSIS — H35371 Puckering of macula, right eye: Secondary | ICD-10-CM | POA: Diagnosis not present

## 2018-06-09 NOTE — Progress Notes (Deleted)
BH MD/PA/NP OP Progress Note  06/09/2018 3:16 PM Bonnie Rush  MRN:  [id number]  Chief Complaint:  HPI: *** Visit Diagnosis: No diagnosis found.  Past Psychiatric History: Please see initial evaluation for full details. I have reviewed the history. No updates at this time.     Past Medical History:  Past Medical History:  Diagnosis Date  . Glaucoma   . High cholesterol   . Hypertension   . Irritable bowel syndrome   . Osteoporosis   . Palpitations     Past Surgical History:  Procedure Laterality Date  . ABDOMINAL HYSTERECTOMY    . BREAST SURGERY    . CHOLECYSTECTOMY    . EYE SURGERY    . ORIF PATELLA Left 01/19/2016   Procedure: OPEN REDUCTION INTERNAL (ORIF) FIXATION LEFT PATELLA  FRACTURE;  Surgeon: Vickki Hearing, MD;  Location: [facility];  Service: Orthopedics;  Laterality: Left;    Family Psychiatric History: Please see initial evaluation for full details. I have reviewed the history. No updates at this time.     Family History: No family history on file.  Social History:  Social History   Socioeconomic History  . Marital status: Widowed    Spouse name: Not on file  . Number of children: Not on file  . Years of education: Not on file  . Highest education level: Not on file  Occupational History  . Not on file  Social Needs  . Financial resource strain: Not on file  . Food insecurity:    Worry: Not on file    Inability: Not on file  . Transportation needs:    Medical: Not on file    Non-medical: Not on file  Tobacco Use  . Smoking status: Never Smoker  . Smokeless tobacco: Never Used  Substance and Sexual Activity  . Alcohol use: No  . Drug use: No  . Sexual activity: Not Currently  Lifestyle  . Physical activity:    Days per week: Not on file    Minutes per session: Not on file  . Stress: Not on file  Relationships  . Social connections:    Talks on phone: Not on file    Gets together: Not on file    Attends religious service: Not on  file    Active member of club or organization: Not on file    Attends meetings of clubs or organizations: Not on file    Relationship status: Not on file  Other Topics Concern  . Not on file  Social History Narrative  . Not on file    Allergies:  Allergies  Allergen Reactions  . Penicillins     Unknown-childhood allergy Pt tolerated Rocephin IV on 04/11/2018 (monitored in the ED)    Metabolic Disorder Labs: No results found for: HGBA1C, MPG No results found for: PROLACTIN No results found for: CHOL, TRIG, HDL, CHOLHDL, VLDL, LDLCALC No results found for: TSH  Therapeutic Level Labs: No results found for: LITHIUM No results found for: VALPROATE No components found for:  CBMZ  Current Medications: Current Outpatient Medications  Medication Sig Dispense Refill  . atorvastatin (LIPITOR) 20 MG tablet Take 20 mg by mouth every morning.     . Calcium Carb-Cholecalciferol (CALCIUM 600 + D PO) Take 1 tablet by mouth daily.    . cefpodoxime (VANTIN) 200 MG tablet Take 1 tablet (200 mg total) by mouth 2 (two) times daily. 20 tablet 0  . clonazePAM (KLONOPIN) 1 MG tablet Take 1 mg daily and  0.5 mg at night (Patient taking differently: Take 0.5-1 mg by mouth See admin instructions. Take 1 mg daily and 0.5 mg at night) 45 tablet 3  . dexlansoprazole (DEXILANT) 60 MG capsule Take 60 mg by mouth daily.    . diphenoxylate-atropine (LOMOTIL) 2.5-0.025 MG tablet Take 1 tablet by mouth 4 (four) times daily as needed for diarrhea or loose stools.    . docusate sodium (COLACE) 100 MG capsule Take 1 capsule (100 mg total) by mouth 2 (two) times daily as needed for mild constipation. 20 capsule 0  . fexofenadine (ALLEGRA) 180 MG tablet Take 180 mg by mouth daily.    . hydrochlorothiazide (HYDRODIURIL) 25 MG tablet Take 25 mg by mouth daily.    Marland Kitchen latanoprost (XALATAN) 0.005 % ophthalmic solution Place 1 drop into both eyes at bedtime.    Marland Kitchen loperamide (IMODIUM) 2 MG capsule Take 2 mg by mouth as  needed for diarrhea or loose stools.    . meclizine (ANTIVERT) 25 MG tablet Take 25 mg by mouth 3 (three) times daily.     . potassium chloride (K-DUR) 10 MEQ tablet Take 1 tablet (10 mEq total) by mouth 2 (two) times daily. 10 tablet 0  . promethazine (PHENERGAN) 25 MG tablet Take 25 mg by mouth every 6 (six) hours as needed for nausea or vomiting.    . propranolol (INDERAL) 60 MG tablet Take 60 mg by mouth 2 (two) times daily.    . timolol (BETIMOL) 0.5 % ophthalmic solution Place 1 drop into the right eye daily.    Marland Kitchen venlafaxine XR (EFFEXOR-XR) 150 MG 24 hr capsule Take 1 capsule (150 mg total) by mouth daily with breakfast. 90 capsule 1   No current facility-administered medications for this visit.      Musculoskeletal: Strength & Muscle Tone: within normal limits Gait & Station: normal Patient leans: N/A  Psychiatric Specialty Exam: ROS  There were no vitals taken for this visit.There is no height or weight on file to calculate BMI.  General Appearance: Fairly Groomed  Eye Contact:  Good  Speech:  Clear and Coherent  Volume:  Normal  Mood:  {BHH MOOD:22306}  Affect:  {Affect (PAA):22687}  Thought Process:  Coherent  Orientation:  Full (Time, Place, and Person)  Thought Content: Logical   Suicidal Thoughts:  {ST/HT (PAA):22692}  Homicidal Thoughts:  {ST/HT (PAA):22692}  Memory:  Immediate;   Good  Judgement:  {Judgement (PAA):22694}  Insight:  {Insight (PAA):22695}  Psychomotor Activity:  Normal  Concentration:  Concentration: Good and Attention Span: Good  Recall:  Good  Fund of Knowledge: Good  Language: Good  Akathisia:  No  Handed:  Right  AIMS (if indicated): not done  Assets:  Communication Skills Desire for Improvement  ADL's:  Intact  Cognition: WNL  Sleep:  {BHH GOOD/FAIR/POOR:22877}   Screenings: MOCA 11 on 05/2017. : +2 for drawing clock,+2 for naming, +2 for attention, +5 for orientation  Assessment and Plan:  Bonnie Rush is a 84 y.o. year old  female with a history of depression, neurocognitive disorder, hypertension,hyperlipidemia, irritable bowel syndrome, left patella fracture s/p ORIF,  who presents for follow up appointment for No diagnosis found.   # MDD  # Unspecified anxiety disorder  Patient reports overall improvement in mood symptoms with less rumination on clonazepam.  Will continue Effexor to target depression and anxiety.  Will continue clonazepam as needed for anxiety.  Although it has been strongly advised to avoid this medication given its risk of falls, both  the patient and her daughter requests it to be continued as she has significant benefit from this medication. Discussed risks including but not limited to falls, oversedation and independence. Given concern for adherence, will order with refills.  #Major neurocognitive disorder-mild Patient does have neurocognitive deficits as in MOCA. She had adverse reaction of VH while on Aricept. Will discontinue this medication.   Plan  1. Continue venlafaxine 150 mg daily 2. Continue clonazepam 1 mg during the day and 0.5 mg at night(refill after 3/20. Will not do 90 days given concern for adherence) 3. Discontinue aricept 4. Return to clinic in four months for 30 mins  The patient demonstrates the following risk factors for suicide: Chronic risk factorsfor suicide include psychiatric disorder, demographic factors 58( >60 yo). Acute risk factorsfor suicide includenone.Protective factorsfor this patient include positive social support,hope for the future. Considering these factors, the overall suicide risk at this point appears to be low.  Bonnie Hottereina Kamoni Gentles, MD 06/09/2018, 3:16 PM

## 2018-06-15 ENCOUNTER — Ambulatory Visit (HOSPITAL_COMMUNITY): Payer: Self-pay | Admitting: Psychiatry

## 2018-06-15 NOTE — Progress Notes (Deleted)
BH MD/PA/NP OP Progress Note  06/15/2018 12:35 PM Bonnie Rush  MRN:  [id number]  Chief Complaint:  HPI: *** Visit Diagnosis: No diagnosis found.  Past Psychiatric History: Please see initial evaluation for full details. I have reviewed the history. No updates at this time.     Past Medical History:  Past Medical History:  Diagnosis Date  . Glaucoma   . High cholesterol   . Hypertension   . Irritable bowel syndrome   . Osteoporosis   . Palpitations     Past Surgical History:  Procedure Laterality Date  . ABDOMINAL HYSTERECTOMY    . BREAST SURGERY    . CHOLECYSTECTOMY    . EYE SURGERY    . ORIF PATELLA Left 01/19/2016   Procedure: OPEN REDUCTION INTERNAL (ORIF) FIXATION LEFT PATELLA  FRACTURE;  Surgeon: Vickki Hearing, MD;  Location: [facility];  Service: Orthopedics;  Laterality: Left;    Family Psychiatric History: Please see initial evaluation for full details. I have reviewed the history. No updates at this time.     Family History: No family history on file.  Social History:  Social History   Socioeconomic History  . Marital status: Widowed    Spouse name: Not on file  . Number of children: Not on file  . Years of education: Not on file  . Highest education level: Not on file  Occupational History  . Not on file  Social Needs  . Financial resource strain: Not on file  . Food insecurity:    Worry: Not on file    Inability: Not on file  . Transportation needs:    Medical: Not on file    Non-medical: Not on file  Tobacco Use  . Smoking status: Never Smoker  . Smokeless tobacco: Never Used  Substance and Sexual Activity  . Alcohol use: No  . Drug use: No  . Sexual activity: Not Currently  Lifestyle  . Physical activity:    Days per week: Not on file    Minutes per session: Not on file  . Stress: Not on file  Relationships  . Social connections:    Talks on phone: Not on file    Gets together: Not on file    Attends religious service: Not on  file    Active member of club or organization: Not on file    Attends meetings of clubs or organizations: Not on file    Relationship status: Not on file  Other Topics Concern  . Not on file  Social History Narrative  . Not on file    Allergies:  Allergies  Allergen Reactions  . Penicillins     Unknown-childhood allergy Pt tolerated Rocephin IV on 04/11/2018 (monitored in the ED)    Metabolic Disorder Labs: No results found for: HGBA1C, MPG No results found for: PROLACTIN No results found for: CHOL, TRIG, HDL, CHOLHDL, VLDL, LDLCALC No results found for: TSH  Therapeutic Level Labs: No results found for: LITHIUM No results found for: VALPROATE No components found for:  CBMZ  Current Medications: Current Outpatient Medications  Medication Sig Dispense Refill  . atorvastatin (LIPITOR) 20 MG tablet Take 20 mg by mouth every morning.     . Calcium Carb-Cholecalciferol (CALCIUM 600 + D PO) Take 1 tablet by mouth daily.    . cefpodoxime (VANTIN) 200 MG tablet Take 1 tablet (200 mg total) by mouth 2 (two) times daily. 20 tablet 0  . clonazePAM (KLONOPIN) 1 MG tablet Take 1 mg daily and  0.5 mg at night (Patient taking differently: Take 0.5-1 mg by mouth See admin instructions. Take 1 mg daily and 0.5 mg at night) 45 tablet 3  . dexlansoprazole (DEXILANT) 60 MG capsule Take 60 mg by mouth daily.    . diphenoxylate-atropine (LOMOTIL) 2.5-0.025 MG tablet Take 1 tablet by mouth 4 (four) times daily as needed for diarrhea or loose stools.    . docusate sodium (COLACE) 100 MG capsule Take 1 capsule (100 mg total) by mouth 2 (two) times daily as needed for mild constipation. 20 capsule 0  . fexofenadine (ALLEGRA) 180 MG tablet Take 180 mg by mouth daily.    . hydrochlorothiazide (HYDRODIURIL) 25 MG tablet Take 25 mg by mouth daily.    Marland Kitchen. latanoprost (XALATAN) 0.005 % ophthalmic solution Place 1 drop into both eyes at bedtime.    Marland Kitchen. loperamide (IMODIUM) 2 MG capsule Take 2 mg by mouth as  needed for diarrhea or loose stools.    . meclizine (ANTIVERT) 25 MG tablet Take 25 mg by mouth 3 (three) times daily.     . potassium chloride (K-DUR) 10 MEQ tablet Take 1 tablet (10 mEq total) by mouth 2 (two) times daily. 10 tablet 0  . promethazine (PHENERGAN) 25 MG tablet Take 25 mg by mouth every 6 (six) hours as needed for nausea or vomiting.    . propranolol (INDERAL) 60 MG tablet Take 60 mg by mouth 2 (two) times daily.    . timolol (BETIMOL) 0.5 % ophthalmic solution Place 1 drop into the right eye daily.    Marland Kitchen. venlafaxine XR (EFFEXOR-XR) 150 MG 24 hr capsule Take 1 capsule (150 mg total) by mouth daily with breakfast. 90 capsule 1   No current facility-administered medications for this visit.      Musculoskeletal: Strength & Muscle Tone: within normal limits Gait & Station: normal Patient leans: N/A  Psychiatric Specialty Exam: ROS  There were no vitals taken for this visit.There is no height or weight on file to calculate BMI.  General Appearance: Fairly Groomed  Eye Contact:  Good  Speech:  Clear and Coherent  Volume:  Normal  Mood:  {BHH MOOD:22306}  Affect:  {Affect (PAA):22687}  Thought Process:  Coherent  Orientation:  Full (Time, Place, and Person)  Thought Content: Logical   Suicidal Thoughts:  {ST/HT (PAA):22692}  Homicidal Thoughts:  {ST/HT (PAA):22692}  Memory:  Immediate;   Good  Judgement:  {Judgement (PAA):22694}  Insight:  {Insight (PAA):22695}  Psychomotor Activity:  Normal  Concentration:  Concentration: Good and Attention Span: Good  Recall:  Good  Fund of Knowledge: Good  Language: Good  Akathisia:  No  Handed:  Right  AIMS (if indicated): not done  Assets:  Communication Skills Desire for Improvement  ADL's:  Intact  Cognition: WNL  Sleep:  {BHH GOOD/FAIR/POOR:22877}   Screenings: MOCA 11 on 05/2017. : +2 for drawing clock,+2 for naming, +2 for attention, +5 for orientation  Assessment and Plan:  Bonnie Rush is a 84 y.o. year old  female with a history of depression, major neurocognitive disorder, hypertension,hyperlipidemia, irritable bowel syndrome, left patella fracture s/p ORIF  , who presents for follow up appointment for No diagnosis found.  # MDD # Unspecified anxiety disorder  Patient reports overall improvement in mood symptoms with less rumination on clonazepam.  Will continue Effexor to target depression and anxiety.  Will continue clonazepam as needed for anxiety.  Although it has been strongly advised to avoid this medication given its risk of falls, both  the patient and her daughter requests it to be continued as she has significant benefit from this medication. Discussed risks including but not limited to falls, oversedation and independence. Given concern for adherence, will order with refills.  # Major neurocognitive disorder- mild  Patient does have neurocognitive deficits as in MOCA. She had adverse reaction of VH while on Aricept. Will discontinue this medication.   Plan  1. Continue venlafaxine 150 mg daily 2. Continue clonazepam 1 mg during the day and 0.5 mg at night(refill after 3/20. Will not do 90 days given concern for adherence) 3. Discontinue aricept 4. Return to clinic in four months for 30 mins  The patient demonstrates the following risk factors for suicide: Chronic risk factorsfor suicide include psychiatric disorder, demographic factors ( >25 yo). Acute risk factorsfor suicide includenone.Protective factorsfor this patient include positive social support,hope for the future. Considering these factors, the overall suicide risk at this point appears to be low.  [name], MD 06/15/2018, 12:35 PM

## 2018-06-15 NOTE — Progress Notes (Deleted)
BH MD/PA/NP OP Progress Note  06/15/2018 11:25 AM Bonnie Rush  MRN:  [id number]  Chief Complaint:  HPI: *** Visit Diagnosis: No diagnosis found.  Past Psychiatric History: Please see initial evaluation for full details. I have reviewed the history. No updates at this time.     Past Medical History:  Past Medical History:  Diagnosis Date  . Glaucoma   . High cholesterol   . Hypertension   . Irritable bowel syndrome   . Osteoporosis   . Palpitations     Past Surgical History:  Procedure Laterality Date  . ABDOMINAL HYSTERECTOMY    . BREAST SURGERY    . CHOLECYSTECTOMY    . EYE SURGERY    . ORIF PATELLA Left 01/19/2016   Procedure: OPEN REDUCTION INTERNAL (ORIF) FIXATION LEFT PATELLA  FRACTURE;  Surgeon: Vickki Hearing, MD;  Location: [facility];  Service: Orthopedics;  Laterality: Left;    Family Psychiatric History: Please see initial evaluation for full details. I have reviewed the history. No updates at this time.     Family History: No family history on file.  Social History:  Social History   Socioeconomic History  . Marital status: Widowed    Spouse name: Not on file  . Number of children: Not on file  . Years of education: Not on file  . Highest education level: Not on file  Occupational History  . Not on file  Social Needs  . Financial resource strain: Not on file  . Food insecurity:    Worry: Not on file    Inability: Not on file  . Transportation needs:    Medical: Not on file    Non-medical: Not on file  Tobacco Use  . Smoking status: Never Smoker  . Smokeless tobacco: Never Used  Substance and Sexual Activity  . Alcohol use: No  . Drug use: No  . Sexual activity: Not Currently  Lifestyle  . Physical activity:    Days per week: Not on file    Minutes per session: Not on file  . Stress: Not on file  Relationships  . Social connections:    Talks on phone: Not on file    Gets together: Not on file    Attends religious service: Not on  file    Active member of club or organization: Not on file    Attends meetings of clubs or organizations: Not on file    Relationship status: Not on file  Other Topics Concern  . Not on file  Social History Narrative  . Not on file    Allergies:  Allergies  Allergen Reactions  . Penicillins     Unknown-childhood allergy Pt tolerated Rocephin IV on 04/11/2018 (monitored in the ED)    Metabolic Disorder Labs: No results found for: HGBA1C, MPG No results found for: PROLACTIN No results found for: CHOL, TRIG, HDL, CHOLHDL, VLDL, LDLCALC No results found for: TSH  Therapeutic Level Labs: No results found for: LITHIUM No results found for: VALPROATE No components found for:  CBMZ  Current Medications: Current Outpatient Medications  Medication Sig Dispense Refill  . atorvastatin (LIPITOR) 20 MG tablet Take 20 mg by mouth every morning.     . Calcium Carb-Cholecalciferol (CALCIUM 600 + D PO) Take 1 tablet by mouth daily.    . cefpodoxime (VANTIN) 200 MG tablet Take 1 tablet (200 mg total) by mouth 2 (two) times daily. 20 tablet 0  . clonazePAM (KLONOPIN) 1 MG tablet Take 1 mg daily and  0.5 mg at night (Patient taking differently: Take 0.5-1 mg by mouth See admin instructions. Take 1 mg daily and 0.5 mg at night) 45 tablet 3  . dexlansoprazole (DEXILANT) 60 MG capsule Take 60 mg by mouth daily.    . diphenoxylate-atropine (LOMOTIL) 2.5-0.025 MG tablet Take 1 tablet by mouth 4 (four) times daily as needed for diarrhea or loose stools.    . docusate sodium (COLACE) 100 MG capsule Take 1 capsule (100 mg total) by mouth 2 (two) times daily as needed for mild constipation. 20 capsule 0  . fexofenadine (ALLEGRA) 180 MG tablet Take 180 mg by mouth daily.    . hydrochlorothiazide (HYDRODIURIL) 25 MG tablet Take 25 mg by mouth daily.    Marland Kitchen. latanoprost (XALATAN) 0.005 % ophthalmic solution Place 1 drop into both eyes at bedtime.    Marland Kitchen. loperamide (IMODIUM) 2 MG capsule Take 2 mg by mouth as  needed for diarrhea or loose stools.    . meclizine (ANTIVERT) 25 MG tablet Take 25 mg by mouth 3 (three) times daily.     . potassium chloride (K-DUR) 10 MEQ tablet Take 1 tablet (10 mEq total) by mouth 2 (two) times daily. 10 tablet 0  . promethazine (PHENERGAN) 25 MG tablet Take 25 mg by mouth every 6 (six) hours as needed for nausea or vomiting.    . propranolol (INDERAL) 60 MG tablet Take 60 mg by mouth 2 (two) times daily.    . timolol (BETIMOL) 0.5 % ophthalmic solution Place 1 drop into the right eye daily.    Marland Kitchen. venlafaxine XR (EFFEXOR-XR) 150 MG 24 hr capsule Take 1 capsule (150 mg total) by mouth daily with breakfast. 90 capsule 1   No current facility-administered medications for this visit.      Musculoskeletal: Strength & Muscle Tone: within normal limits Gait & Station: normal Patient leans: N/A  Psychiatric Specialty Exam: ROS  There were no vitals taken for this visit.There is no height or weight on file to calculate BMI.  General Appearance: Fairly Groomed  Eye Contact:  Good  Speech:  Clear and Coherent  Volume:  Normal  Mood:  {BHH MOOD:22306}  Affect:  {Affect (PAA):22687}  Thought Process:  Coherent  Orientation:  Full (Time, Place, and Person)  Thought Content: Logical   Suicidal Thoughts:  {ST/HT (PAA):22692}  Homicidal Thoughts:  {ST/HT (PAA):22692}  Memory:  Immediate;   Fair  Judgement:  {Judgement (PAA):22694}  Insight:  {Insight (PAA):22695}  Psychomotor Activity:  Normal  Concentration:  Concentration: Good and Attention Span: Good  Recall:  Good  Fund of Knowledge: Good  Language: Good  Akathisia:  No  Handed:  Right  AIMS (if indicated): not done  Assets:  Communication Skills Desire for Improvement  ADL's:  Intact  Cognition: WNL  Sleep:  {BHH GOOD/FAIR/POOR:22877}   Screenings: MOCA 11 on 05/2017. : +2 for drawing clock,+2 for naming, +2 for attention, +5 for orientation  Assessment and Plan:  Bonnie Rush is a 84 y.o. year old  female with a history of depression, Major neurocognitive disorder,hypertension,hyperlipidemia, irritable bowel syndrome, left patella fracture s/p ORIF , who presents for follow up appointment for No diagnosis found.  # MDD # Unspecified anxiety disorder  Patient reports overall improvement in mood symptoms with less rumination on clonazepam.  Will continue Effexor to target depression and anxiety.  Will continue clonazepam as needed for anxiety.  Although it has been strongly advised to avoid this medication given its risk of falls, both the patient  and her daughter requests it to be continued as she has significant benefit from this medication. Discussed risks including but not limited to falls, oversedation and independence. Given concern for adherence, will order with refills.  # Major neurocognitive disorder Patient does have neurocognitive deficits as in MOCA. She had adverse reaction of VH while on Aricept. Will discontinue this medication.   Plan  1. Continue venlafaxine 150 mg daily 2. Continue clonazepam 1 mg during the day and 0.5 mg at night(refill after 3/20. Will not do 90 days given concern for adherence) 3. Discontinue aricept 4. Return to clinic in four months for 30 mins  The patient demonstrates the following risk factors for suicide: Chronic risk factorsfor suicide include psychiatric disorder, demographic factors ( >61 yo). Acute risk factorsfor suicide includenone.Protective factorsfor this patient include positive social support,hope for the future. Considering these factors, the overall suicide risk at this point appears to be low.  [name], MD 06/15/2018, 11:25 AM

## 2018-06-17 ENCOUNTER — Ambulatory Visit (HOSPITAL_COMMUNITY): Payer: Medicare Other | Admitting: Psychiatry

## 2018-06-18 ENCOUNTER — Other Ambulatory Visit (HOSPITAL_COMMUNITY): Payer: Self-pay | Admitting: Psychiatry

## 2018-06-18 MED ORDER — CLONAZEPAM 1 MG PO TABS: ORAL_TABLET | ORAL | 0 refills | Status: DC

## 2018-06-26 NOTE — Progress Notes (Signed)
BH MD/PA/NP OP Progress Note  07/02/2018 10:06 AM DALYLAH RAMEY  MRN:  [id number]  Chief Complaint:  Chief Complaint    Depression; Anxiety; Follow-up     HPI:  Patient presents for follow-up appointment for depression and anxiety.  She states that she had cold and has nausea for a while.  She has had good days and bad days.  Her son has been helping the patient as her daughter is very busy due to tax season.  She asks clonazepam to be increased, stating that the half dose does not work for her anxiety.  Although she initially says she has panic attacks, she later denies it, stating that she takes clonazepam.  She denies any hallucinations after discontinuing Aricept.  She then talks about Xanax, with caused her hallucinations in the past.  She has occasional insomnia.  She feels fatigue at times.  She denies feeling depressed.  She has fair concentration.  She denies SI.  She feels anxious and tense.   Wt Readings from Last 3 Encounters:  07/02/18 153 lb (69.4 kg)  04/11/18 153 lb (69.4 kg)  02/09/18 150 lb (68 kg)   Per PMP Clonazepam filled on 06/29/2018    Visit Diagnosis:    ICD-10-CM   1. Moderate episode of recurrent major depressive disorder (HCC) F33.1     Past Psychiatric History: Please see initial evaluation for full details. I have reviewed the history. No updates at this time.     Past Medical History:  Past Medical History:  Diagnosis Date  . Glaucoma   . High cholesterol   . Hypertension   . Irritable bowel syndrome   . Osteoporosis   . Palpitations     Past Surgical History:  Procedure Laterality Date  . ABDOMINAL HYSTERECTOMY    . BREAST SURGERY    . CHOLECYSTECTOMY    . EYE SURGERY    . ORIF PATELLA Left 01/19/2016   Procedure: OPEN REDUCTION INTERNAL (ORIF) FIXATION LEFT PATELLA  FRACTURE;  Surgeon: Vickki Hearing, MD;  Location: [facility];  Service: Orthopedics;  Laterality: Left;    Family Psychiatric History: Please see initial evaluation for  full details. I have reviewed the history. No updates at this time.     Family History: No family history on file.  Social History:  Social History   Socioeconomic History  . Marital status: Widowed    Spouse name: Not on file  . Number of children: Not on file  . Years of education: Not on file  . Highest education level: Not on file  Occupational History  . Not on file  Social Needs  . Financial resource strain: Not on file  . Food insecurity:    Worry: Not on file    Inability: Not on file  . Transportation needs:    Medical: Not on file    Non-medical: Not on file  Tobacco Use  . Smoking status: Never Smoker  . Smokeless tobacco: Never Used  Substance and Sexual Activity  . Alcohol use: No  . Drug use: No  . Sexual activity: Not Currently  Lifestyle  . Physical activity:    Days per week: Not on file    Minutes per session: Not on file  . Stress: Not on file  Relationships  . Social connections:    Talks on phone: Not on file    Gets together: Not on file    Attends religious service: Not on file    Active member of club  or organization: Not on file    Attends meetings of clubs or organizations: Not on file    Relationship status: Not on file  Other Topics Concern  . Not on file  Social History Narrative  . Not on file    Allergies:  Allergies  Allergen Reactions  . Penicillins     Unknown-childhood allergy Pt tolerated Rocephin IV on 04/11/2018 (monitored in the ED)    Metabolic Disorder Labs: No results found for: HGBA1C, MPG No results found for: PROLACTIN No results found for: CHOL, TRIG, HDL, CHOLHDL, VLDL, LDLCALC No results found for: TSH  Therapeutic Level Labs: No results found for: LITHIUM No results found for: VALPROATE No components found for:  CBMZ  Current Medications: Current Outpatient Medications  Medication Sig Dispense Refill  . atorvastatin (LIPITOR) 20 MG tablet Take 20 mg by mouth every morning.     . Calcium  Carb-Cholecalciferol (CALCIUM 600 + D PO) Take 1 tablet by mouth daily.    . cefpodoxime (VANTIN) 200 MG tablet Take 1 tablet (200 mg total) by mouth 2 (two) times daily. 20 tablet 0  . [START ON 07/30/2018] clonazePAM (KLONOPIN) 1 MG tablet Take 1 mg daily and 0.5 mg at night 45 tablet 2  . dexlansoprazole (DEXILANT) 60 MG capsule Take 60 mg by mouth daily.    . diphenoxylate-atropine (LOMOTIL) 2.5-0.025 MG tablet Take 1 tablet by mouth 4 (four) times daily as needed for diarrhea or loose stools.    . docusate sodium (COLACE) 100 MG capsule Take 1 capsule (100 mg total) by mouth 2 (two) times daily as needed for mild constipation. 20 capsule 0  . fexofenadine (ALLEGRA) 180 MG tablet Take 180 mg by mouth daily.    . hydrochlorothiazide (HYDRODIURIL) 25 MG tablet Take 25 mg by mouth daily.    Marland Kitchen latanoprost (XALATAN) 0.005 % ophthalmic solution Place 1 drop into both eyes at bedtime.    Marland Kitchen loperamide (IMODIUM) 2 MG capsule Take 2 mg by mouth as needed for diarrhea or loose stools.    . meclizine (ANTIVERT) 25 MG tablet Take 25 mg by mouth 3 (three) times daily.     . potassium chloride (K-DUR) 10 MEQ tablet Take 1 tablet (10 mEq total) by mouth 2 (two) times daily. 10 tablet 0  . promethazine (PHENERGAN) 25 MG tablet Take 25 mg by mouth every 6 (six) hours as needed for nausea or vomiting.    . propranolol (INDERAL) 60 MG tablet Take 60 mg by mouth 2 (two) times daily.    . timolol (BETIMOL) 0.5 % ophthalmic solution Place 1 drop into the right eye daily.    Marland Kitchen venlafaxine XR (EFFEXOR-XR) 150 MG 24 hr capsule Take 1 capsule (150 mg total) by mouth daily with breakfast. 90 capsule 1   No current facility-administered medications for this visit.      Musculoskeletal: Strength & Muscle Tone: within normal limits Gait & Station: unsteady- use a walker Patient leans: N/A  Psychiatric Specialty Exam: Review of Systems  Psychiatric/Behavioral: Negative for depression, hallucinations, memory loss,  substance abuse and suicidal ideas. The patient is nervous/anxious and has insomnia.   All other systems reviewed and are negative.   Blood pressure 138/77, pulse 65, height 5\' 2"  (1.575 m), weight 153 lb (69.4 kg), SpO2 93 %.Body mass index is 27.98 kg/m.  General Appearance: Fairly Groomed  Eye Contact:  Good  Speech:  Clear and Coherent  Volume:  Normal  Mood:  "good"  Affect:  Appropriate, Congruent and  Full Range  Thought Process:  Coherent  Orientation:  Full (Time, Place, and Person)  Thought Content: Logical   Suicidal Thoughts:  No  Homicidal Thoughts:  No  Memory:  Immediate;   Good  Judgement:  Good  Insight:  Fair  Psychomotor Activity:  Normal  Concentration:  Concentration: Good and Attention Span: Good  Recall:  Good  Fund of Knowledge: Good  Language: Good  Akathisia:  No  Handed:  Right  AIMS (if indicated): not done  Assets:  Communication Skills Desire for Improvement  ADL's:  Intact  Cognition: WNL  Sleep:  Fair   Screenings:  MOCA 11 on 05/2017. : +2 for drawing clock,+2 for naming, +2 for attention, +5 for orientation  Assessment and Plan:  [name] "Carney Bern" is a 84 y.o. year old female with a history of depression, neurocognitive disorder, hypertension,hyperlipidemia, irritable bowel syndrome, left patella fracture s/p ORIF , who presents for follow up appointment for Moderate episode of recurrent major depressive disorder (HCC)  # MDD # Unspecified anxiety disorder Patient reports overall improvement in mood symptoms while she still ruminates to getting clonazepam. Will continue Effexor to target depression and anxiety.  Will continue clonazepam as needed for anxiety.  Although it has been strongly advised to avoid taking clonazepam given risk of falls, both the patient and her daughter reports significant benefit from this medication.  Discussed risks, including but not limited to falls, oversedation.  She has had issues with adherence; will  order this medication with refills.   #Major neurocognitive disorder-mild Patient does have neurocognitive deficits as characterized by [place].  She denies any VH after discontinuing Aricept.  May consider retesting given in the future.    Plan I have reviewed and updated plans as below 1. Continue venlafaxine 150 mg daily 2. Continue clonazepam 1 mg during the day and 0.5 mg at night(refill after 3/20. Will not do 90 days given concern for adherence) 3. Discontinue Aricept 4. Return to clinic in four months for 30 mins  The patient demonstrates the following risk factors for suicide: Chronic risk factorsfor suicide include psychiatric disorder, demographic factors ( >28 yo). Acute risk factorsfor suicide includenone.Protective factorsfor this patient include positive social support,hope for the future. Considering these factors, the overall suicide risk at this point appears to be low.  The duration of this appointment visit was 30 minutes of face-to-face time with the patient.  Greater than 50% of this time was spent in counseling, explanation of  diagnosis, planning of further management, and coordination of care.  [name], MD 07/02/2018, 10:06 AM

## 2018-07-01 DIAGNOSIS — I10 Essential (primary) hypertension: Secondary | ICD-10-CM | POA: Diagnosis not present

## 2018-07-01 DIAGNOSIS — J449 Chronic obstructive pulmonary disease, unspecified: Secondary | ICD-10-CM | POA: Diagnosis not present

## 2018-07-01 DIAGNOSIS — M159 Polyosteoarthritis, unspecified: Secondary | ICD-10-CM | POA: Diagnosis not present

## 2018-07-02 ENCOUNTER — Ambulatory Visit (INDEPENDENT_AMBULATORY_CARE_PROVIDER_SITE_OTHER): Payer: Medicare Other | Admitting: Psychiatry

## 2018-07-02 ENCOUNTER — Encounter (HOSPITAL_COMMUNITY): Payer: Self-pay | Admitting: Psychiatry

## 2018-07-02 VITALS — BP 138/77 | HR 65 | Ht 62.0 in | Wt 153.0 lb

## 2018-07-02 DIAGNOSIS — F419 Anxiety disorder, unspecified: Secondary | ICD-10-CM

## 2018-07-02 DIAGNOSIS — F331 Major depressive disorder, recurrent, moderate: Secondary | ICD-10-CM | POA: Diagnosis not present

## 2018-07-02 DIAGNOSIS — F0150 Vascular dementia without behavioral disturbance: Secondary | ICD-10-CM | POA: Diagnosis not present

## 2018-07-02 DIAGNOSIS — G4700 Insomnia, unspecified: Secondary | ICD-10-CM

## 2018-07-02 MED ORDER — CLONAZEPAM 1 MG PO TABS: ORAL_TABLET | ORAL | 3 refills | Status: DC

## 2018-07-02 MED ORDER — VENLAFAXINE HCL ER 150 MG PO CP24: 150.0000 mg | ORAL_CAPSULE | Freq: Every day | ORAL | 1 refills | Status: DC

## 2018-07-02 MED ORDER — CLONAZEPAM 1 MG PO TABS: ORAL_TABLET | ORAL | 2 refills | Status: DC

## 2018-07-02 NOTE — Patient Instructions (Signed)
1. Continue venlafaxine 150 mg daily 2. Continue clonazepam 1 mg during the day and 0.5 mg at night 3. Return to clinic infourmonths for 30 mins 

## 2018-07-07 DIAGNOSIS — Z87891 Personal history of nicotine dependence: Secondary | ICD-10-CM | POA: Diagnosis not present

## 2018-07-07 DIAGNOSIS — Z6829 Body mass index (BMI) 29.0-29.9, adult: Secondary | ICD-10-CM | POA: Diagnosis not present

## 2018-07-07 DIAGNOSIS — Z299 Encounter for prophylactic measures, unspecified: Secondary | ICD-10-CM | POA: Diagnosis not present

## 2018-07-07 DIAGNOSIS — I10 Essential (primary) hypertension: Secondary | ICD-10-CM | POA: Diagnosis not present

## 2018-07-07 DIAGNOSIS — J449 Chronic obstructive pulmonary disease, unspecified: Secondary | ICD-10-CM | POA: Diagnosis not present

## 2018-07-07 DIAGNOSIS — Z23 Encounter for immunization: Secondary | ICD-10-CM | POA: Diagnosis not present

## 2018-07-08 ENCOUNTER — Other Ambulatory Visit: Payer: Self-pay

## 2018-07-08 ENCOUNTER — Emergency Department (HOSPITAL_COMMUNITY): Payer: Medicare Other

## 2018-07-08 ENCOUNTER — Ambulatory Visit (HOSPITAL_COMMUNITY): Payer: Medicare Other | Admitting: Psychiatry

## 2018-07-08 ENCOUNTER — Emergency Department (HOSPITAL_COMMUNITY)
Admission: EM | Admit: 2018-07-08 | Discharge: 2018-07-08 | Disposition: A | Discharge status: Home / Self Care | Payer: Medicare Other | Attending: Emergency Medicine | Admitting: Emergency Medicine

## 2018-07-08 ENCOUNTER — Encounter (HOSPITAL_COMMUNITY): Payer: Self-pay | Admitting: Emergency Medicine

## 2018-07-08 DIAGNOSIS — W19XXXA Unspecified fall, initial encounter: Secondary | ICD-10-CM

## 2018-07-08 DIAGNOSIS — W010XXA Fall on same level from slipping, tripping and stumbling without subsequent striking against object, initial encounter: Secondary | ICD-10-CM | POA: Insufficient documentation

## 2018-07-08 DIAGNOSIS — I10 Essential (primary) hypertension: Secondary | ICD-10-CM | POA: Diagnosis not present

## 2018-07-08 DIAGNOSIS — Y929 Unspecified place or not applicable: Secondary | ICD-10-CM | POA: Insufficient documentation

## 2018-07-08 DIAGNOSIS — Z79899 Other long term (current) drug therapy: Secondary | ICD-10-CM | POA: Insufficient documentation

## 2018-07-08 DIAGNOSIS — S62115A Nondisplaced fracture of triquetrum [cuneiform] bone, left wrist, initial encounter for closed fracture: Secondary | ICD-10-CM | POA: Diagnosis not present

## 2018-07-08 DIAGNOSIS — M25532 Pain in left wrist: Secondary | ICD-10-CM | POA: Diagnosis not present

## 2018-07-08 DIAGNOSIS — S6992XA Unspecified injury of left wrist, hand and finger(s), initial encounter: Secondary | ICD-10-CM | POA: Diagnosis not present

## 2018-07-08 DIAGNOSIS — Y998 Other external cause status: Secondary | ICD-10-CM | POA: Diagnosis not present

## 2018-07-08 DIAGNOSIS — Z9049 Acquired absence of other specified parts of digestive tract: Secondary | ICD-10-CM | POA: Insufficient documentation

## 2018-07-08 DIAGNOSIS — Y9389 Activity, other specified: Secondary | ICD-10-CM | POA: Insufficient documentation

## 2018-07-08 DIAGNOSIS — M79642 Pain in left hand: Secondary | ICD-10-CM | POA: Diagnosis not present

## 2018-07-08 DIAGNOSIS — S62112A Displaced fracture of triquetrum [cuneiform] bone, left wrist, initial encounter for closed fracture: Secondary | ICD-10-CM | POA: Diagnosis not present

## 2018-07-08 NOTE — Discharge Instructions (Addendum)
You have a small fracture of a bone in your wrist.  I will place you in a brace and have you follow-up with Dr. Romeo Apple, with orthopedics tomorrow.  Please call him for an appointment tomorrow morning.  Return to the ED with any new or worsening symptoms such as:  Contact a health care provider if: Your cast, splint, or sling is damaged or loose. You have any new pain, swelling, or bruising. Your pain, swelling, and bruising do not improve. You have a fever. You have chills. Get help right away if: Your skin or fingers on your injured arm turn blue or gray. Your arm feels cold or gets numb. You have severe pain in your injured wrist.

## 2018-07-08 NOTE — ED Triage Notes (Addendum)
Pt had a fall yesterday. States "my legs gave out. States this is a common occurrence for her. She uses a walker. Pt c/o LT hand pain. Denies LOC, dizziness, or hitting head. Pt ambulatory.

## 2018-07-08 NOTE — ED Provider Notes (Signed)
[facility] EMERGENCY DEPARTMENT Provider Note   CSN: [id number] Arrival date & time: 07/08/18  1713     History   Chief Complaint Chief Complaint  Patient presents with  . Fall    HPI JURNI CESARO is a 84 y.o. female past medical history significant for HTN, osteoporosis, glaucoma who presents for evaluation after mechanical fall.  Patient states she was walking yesterday while she was talking with her son and tripped and fell onto her left hand.  Patient states she went to see her primary care provider for this yesterday and he wrapped this in an Ace bandage.  Did not receive any x-rays at that time.  Patient states that she noticed she had some bruising to the dorsal aspect of her hand when she woke up this morning.  States she has had some mild tenderness to the dorsum of her left hand and wrist.  Rates her current pain a 0/10.  Denies loss of consciousness or head injury.  States she has a history of falls because "I have osteoporosis of the knees and they give out on me."  She is currently followed by Dr. Romeo Apple with orthopedics for her chronic bilateral knee pain.  Denies lightheadedness, dizziness, back pain, neck pain, knee pain, elbow pain, numbness or tingling in her extremities.  HPI  Past Medical History:  Diagnosis Date  . Glaucoma   . High cholesterol   . Hypertension   . Irritable bowel syndrome   . Osteoporosis   . Palpitations     Patient Active Problem List   Diagnosis Date Noted  . Major neurocognitive disorder (HCC) 06/12/2017  . Moderate episode of recurrent major depressive disorder (HCC) 06/20/2016  . Left patella fracture 01/19/2016  . Closed fracture of left patella   . Patella fracture 01/18/2016    Past Surgical History:  Procedure Laterality Date  . ABDOMINAL HYSTERECTOMY    . BREAST SURGERY    . CHOLECYSTECTOMY    . EYE SURGERY    . ORIF PATELLA Left 01/19/2016   Procedure: OPEN REDUCTION INTERNAL (ORIF) FIXATION LEFT PATELLA  FRACTURE;   Surgeon: Vickki Hearing, MD;  Location: [facility];  Service: Orthopedics;  Laterality: Left;     OB History   None      Home Medications    Prior to Admission medications   Medication Sig Start Date End Date Taking? Authorizing Provider  atorvastatin (LIPITOR) 20 MG tablet Take 20 mg by mouth every morning.     [provider]  Calcium Carb-Cholecalciferol (CALCIUM 600 + D PO) Take 1 tablet by mouth daily.    [provider]  cefpodoxime (VANTIN) 200 MG tablet Take 1 tablet (200 mg total) by mouth 2 (two) times daily. 04/11/18   Linwood Dibbles, MD  clonazePAM (KLONOPIN) 1 MG tablet Take 1 mg daily and 0.5 mg at night 07/30/18   [name], MD  dexlansoprazole (DEXILANT) 60 MG capsule Take 60 mg by mouth daily.    [provider]  diphenoxylate-atropine (LOMOTIL) 2.5-0.025 MG tablet Take 1 tablet by mouth 4 (four) times daily as needed for diarrhea or loose stools.    [provider]  docusate sodium (COLACE) 100 MG capsule Take 1 capsule (100 mg total) by mouth 2 (two) times daily as needed for mild constipation. 01/17/16   Raeford Razor, MD  fexofenadine (ALLEGRA) 180 MG tablet Take 180 mg by mouth daily.    [provider]  hydrochlorothiazide (HYDRODIURIL) 25 MG tablet Take 25 mg  by mouth daily.    [provider]  latanoprost (XALATAN) 0.005 % ophthalmic solution Place 1 drop into both eyes at bedtime.    [provider]  loperamide (IMODIUM) 2 MG capsule Take 2 mg by mouth as needed for diarrhea or loose stools.    [provider]  meclizine (ANTIVERT) 25 MG tablet Take 25 mg by mouth 3 (three) times daily.     [provider]  potassium chloride (K-DUR) 10 MEQ tablet Take 1 tablet (10 mEq total) by mouth 2 (two) times daily. 04/11/18   Linwood Dibbles, MD  promethazine (PHENERGAN) 25 MG tablet Take 25 mg by mouth every 6 (six) hours as needed for nausea or vomiting.    [provider]  propranolol  (INDERAL) 60 MG tablet Take 60 mg by mouth 2 (two) times daily.    [provider]  timolol (BETIMOL) 0.5 % ophthalmic solution Place 1 drop into the right eye daily.    [provider]  venlafaxine XR (EFFEXOR-XR) 150 MG 24 hr capsule Take 1 capsule (150 mg total) by mouth daily with breakfast. 07/02/18   [name], MD    Family History No family history on file.  Social History Social History   Tobacco Use  . Smoking status: Never Smoker  . Smokeless tobacco: Never Used  Substance Use Topics  . Alcohol use: No  . Drug use: No     Allergies   Penicillins   Review of Systems Review of Systems  Constitutional: Negative for activity change, appetite change, chills, diaphoresis, fatigue and fever.  Respiratory: Negative for cough and shortness of breath.   Cardiovascular: Negative for chest pain and leg swelling.  Gastrointestinal: Negative for abdominal pain, nausea and vomiting.  Musculoskeletal: Positive for arthralgias. Negative for back pain, gait problem, joint swelling, myalgias, neck pain and neck stiffness.  Skin: Positive for wound.       Bruising to the dorsal aspect of the left distal extremity.     Physical Exam Updated Vital Signs Ht 5\' 3"  (1.6 m)   Wt 68 kg   BMI 26.57 kg/m   Physical Exam  Physical Exam  Constitutional: Pt appears well-developed and well-nourished. No distress.  HENT:  Head: Normocephalic and atraumatic.  Mouth/Throat: Oropharynx is clear and moist. No oropharyngeal exudate.  Eyes: Conjunctivae are normal.  Neck: Normal range of motion. Neck supple.  Full ROM without pain  Cardiovascular: Normal rate, regular rhythm and intact distal pulses.   Pulmonary/Chest: Effort normal and breath sounds normal. No respiratory distress. Pt has no wheezes.  Abdominal: Soft. Pt exhibits no distension. There is no tenderness, rebound or guarding. No abd bruit or pulsatile mass Musculoskeletal:  Full range of motion of the  T-spine and L-spine with flexion, hyperextension, and lateral flexion. No midline tenderness or stepoffs. No tenderness to palpation of the spinous processes of the T-spine or L-spine. No tenderness to palpation of the paraspinous muscles of the L-spine. Negative straight leg raise. No tenderness to palpation over left upper extremity including shoulder, elbow, wrist and hand.  Mild ecchymosis to the dorsal and lateral aspect of the left hand.  Full range of motion to bilateral upper extremities without difficulty.  Intact sensation to sharp and dull bilateral upper extremity.  5/5 strength to bilateral upper extremity.  No scaphoid tenderness.  No step-offs in her wrist or hand. Lymphadenopathy:    Pt has no cervical adenopathy.  Neurological: Pt is alert. Pt has normal reflexes.  Reflex Scores:  Bicep reflexes are 2+ on the right side and 2+ on the left side.      Brachioradialis reflexes are 2+ on the right side and 2+ on the left side.      Patellar reflexes are 2+ on the right side and 2+ on the left side.      Achilles reflexes are 2+ on the right side and 2+ on the left side. Speech is clear and goal oriented, follows commands Normal 5/5 strength in upper and lower extremities bilaterally including dorsiflexion and plantar flexion, strong and equal grip strength Moves extremities without ataxia, coordination intact Normal gait.  Walks with a walker at baseline. Skin: Skin is warm and dry. No rash noted or lesions noted. Pt is not diaphoretic. No erythema or warmth.  Mild ecchymosis and soft tissue swelling to the dorsal aspect of left hand.  Approximately 1 cm skin abrasion to ulnar aspect of left hand between fifth metacarpal and wrist. Psychiatric: Pt has a normal mood and affect. Behavior is normal.  Nursing note and vitals reviewed. ED Treatments / Results  Labs (all labs ordered are listed, but only abnormal results are displayed) Labs Reviewed - No data to  display  EKG None  Radiology Dg Wrist Complete Left  Result Date: 07/08/2018 CLINICAL DATA:  Left hand and wrist pain after fall yesterday EXAM: LEFT WRIST - COMPLETE 3+ VIEW COMPARISON:  None. FINDINGS: There is a small bony fragment dorsal to the carpus, suspect triquetrum avulsion fracture. There is adjacent soft tissue swelling. Upper normal scapholunate interval, likely accentuated by positioning. IMPRESSION: Suspect avulsion fracture from the dorsal triquetrum. Electronically Signed   By: Marnee Spring M.D.   On: 07/08/2018 19:11   Dg Hand Complete Left  Result Date: 07/08/2018 CLINICAL DATA:  Fall with hand and wrist pain.  Initial encounter. EXAM: LEFT HAND - COMPLETE 3+ VIEW COMPARISON:  None. FINDINGS: There is dorsal carpal bony irregularity ,suspect triquetrum avulsion fracture. There is adjacent soft tissue swelling. Diffuse interphalangeal osteo arthritic joint narrowing and spurring. IMPRESSION: Suspect avulsion fracture from the dorsal triquetrum. Electronically Signed   By: Marnee Spring M.D.   On: 07/08/2018 19:12    Procedures Procedures (including critical care time)  Medications Ordered in ED Medications - No data to display   Initial Impression / Assessment and Plan / ED Course  I have reviewed the triage vital signs and the nursing notes.  Pertinent labs & imaging results that were available during my care of the patient were reviewed by me and considered in my medical decision making (see chart for details).  84 year old otherwise well-appearing female presents for evaluation after mechanical fall yesterday 07/08/18.  She is without pain, mild ecchymosis to the dorsal aspect of the left hand.  Neurovascularly intact.  Full range of motion and 5/5 strength bilateral upper extremity.  Mild 1 cm skin abrasion to left upper extremity, does not need laceration repair.  Will thoroughly clean this area and obtain plain film to rule out fracture of wrist or hand and  reevaluate.  1820: Nursing notified this provider the imaging machines are down and cannot be forwarded to radiology.   1920: Plain film with suspect avulsion fracture from the dorsal triquetrum.  Will place in volar splint with slight angulation and have her follow-up with Dr. Romeo Apple, with orthopedics for reevaluation tomorrow.  Patient is already a patient of Dr. Mort Sawyers.  She is neurovascularly intact after splint placement.  Discussed with patient and patient's son reasons to return to  the emergency department patient and son are agreeable for follow-up with Dr. Romeo Apple for reevaluation.  Stable for discharge at this time.  This note was dictated with voice recognition software.  Despite best efforts at proofreading, errors may occur which can change the documentation meaning.   Final Clinical Impressions(s) / ED Diagnoses   Final diagnoses:  Fall, initial encounter  Closed nondisplaced fracture of triquetrum of left wrist, initial encounter    ED Discharge Orders    None       Deatra Mcmahen A, PA-C 07/08/18 2018    Donnetta Hutching, MD 07/08/18 2139

## 2018-07-09 ENCOUNTER — Telehealth: Payer: Self-pay | Admitting: Orthopedic Surgery

## 2018-07-09 NOTE — Telephone Encounter (Signed)
Call received from patient following Emergency room visit at [facility] last evening, 07/08/18

## 2018-07-09 NOTE — Telephone Encounter (Signed)
Any time next week-

## 2018-07-09 NOTE — Telephone Encounter (Signed)
CONT'd:  relays she was referred to [place] for problem of " IMPRESSION: Suspect avulsion fracture from the dorsal triquetrum, left."   I offered today with Dr Hilda Lias, and patient cannot come.  Please review and advise.  Patient's ph# [phone] / alternate [phone]

## 2018-07-09 NOTE — Telephone Encounter (Signed)
Called patient, scheduled accordingly; voiced understanding.

## 2018-07-15 ENCOUNTER — Ambulatory Visit: Payer: Self-pay | Admitting: Orthopedic Surgery

## 2018-07-27 ENCOUNTER — Ambulatory Visit (INDEPENDENT_AMBULATORY_CARE_PROVIDER_SITE_OTHER): Payer: Medicare Other

## 2018-07-27 ENCOUNTER — Ambulatory Visit (INDEPENDENT_AMBULATORY_CARE_PROVIDER_SITE_OTHER): Payer: Medicare Other | Admitting: Orthopedic Surgery

## 2018-07-27 ENCOUNTER — Encounter: Payer: Self-pay | Admitting: Orthopedic Surgery

## 2018-07-27 VITALS — BP 130/70 | HR 58 | Wt 150.0 lb

## 2018-07-27 DIAGNOSIS — S62112A Displaced fracture of triquetrum [cuneiform] bone, left wrist, initial encounter for closed fracture: Secondary | ICD-10-CM

## 2018-07-27 DIAGNOSIS — S62102A Fracture of unspecified carpal bone, left wrist, initial encounter for closed fracture: Secondary | ICD-10-CM

## 2018-07-27 NOTE — Progress Notes (Signed)
  New problem  Chief Complaint  Patient presents with  . Wrist Injury    ER follow up on left wrist fracture, DOI 07-07-18.    84 year old female fell at the doctor's office injured her left hand.  Pain is located over the dorsum of the hand its mild its dull the injury date was October 15 its associated with swelling   Review of Systems  Musculoskeletal: Positive for joint pain.  Skin: Negative.   Neurological: Negative for tingling.     Past Medical History:  Diagnosis Date  . Glaucoma   . High cholesterol   . Hypertension   . Irritable bowel syndrome   . Osteoporosis   . Palpitations     Past Surgical History:  Procedure Laterality Date  . ABDOMINAL HYSTERECTOMY    . BREAST SURGERY    . CHOLECYSTECTOMY    . EYE SURGERY    . ORIF PATELLA Left 01/19/2016   Procedure: OPEN REDUCTION INTERNAL (ORIF) FIXATION LEFT PATELLA  FRACTURE;  Surgeon: Vickki Hearing, MD;  Location: [facility];  Service: Orthopedics;  Laterality: Left;    Family History  Problem Relation Age of Onset  . High blood pressure Mother   . Stroke Father    Social History   Tobacco Use  . Smoking status: Never Smoker  . Smokeless tobacco: Never Used  Substance Use Topics  . Alcohol use: No  . Drug use: No    Allergies  Allergen Reactions  . Penicillins     Unknown-childhood allergy Pt tolerated Rocephin IV on 04/11/2018 (monitored in the ED)    No outpatient medications have been marked as taking for the 07/27/18 encounter (Office Visit) with Vickki Hearing, MD.    BP 130/70   Pulse (!) 58   Wt 150 lb (68 kg)   BMI 26.57 kg/m   Physical Exam  Ortho Exam    MEDICAL DECISION SECTION  X[facility] were done at [facility]  My independent reading of xrays:  ICA dorsal fracture in the hand wrist area probably triquetral  I then repeated the x-ray today x-ray shows a nondisplaced triquetral fracture  Encounter Diagnosis  Name Primary?  . Triquetral chip fracture,  left, closed, initial encounter Yes    PLAN: (Rx., injectx, surgery, frx, mri/ct) Removable wrist splint 4 weeks x-ray  No orders of the defined types were placed in this encounter.   Fuller Canada, MD  07/27/2018 5:00 PM

## 2018-08-13 DIAGNOSIS — M159 Polyosteoarthritis, unspecified: Secondary | ICD-10-CM | POA: Diagnosis not present

## 2018-08-13 DIAGNOSIS — J449 Chronic obstructive pulmonary disease, unspecified: Secondary | ICD-10-CM | POA: Diagnosis not present

## 2018-08-13 DIAGNOSIS — I10 Essential (primary) hypertension: Secondary | ICD-10-CM | POA: Diagnosis not present

## 2018-08-27 DIAGNOSIS — S62112A Displaced fracture of triquetrum [cuneiform] bone, left wrist, initial encounter for closed fracture: Secondary | ICD-10-CM | POA: Insufficient documentation

## 2018-08-28 ENCOUNTER — Ambulatory Visit (INDEPENDENT_AMBULATORY_CARE_PROVIDER_SITE_OTHER): Payer: Medicare Other | Admitting: Orthopedic Surgery

## 2018-08-28 ENCOUNTER — Encounter: Payer: Self-pay | Admitting: Orthopedic Surgery

## 2018-08-28 ENCOUNTER — Ambulatory Visit (INDEPENDENT_AMBULATORY_CARE_PROVIDER_SITE_OTHER): Payer: Medicare Other

## 2018-08-28 DIAGNOSIS — S62112D Displaced fracture of triquetrum [cuneiform] bone, left wrist, subsequent encounter for fracture with routine healing: Secondary | ICD-10-CM

## 2018-08-28 NOTE — Progress Notes (Signed)
Patient ID: Bonnie Rush, female   DOB: 05-15-1934, 84 y.o.   MRN: [id number]  FRACTURE CARE   Chief Complaint  Patient presents with  . Fracture    Lt wrist DOI 07/07/18    Encounter Diagnosis  Name Primary?  . Closed chip fracture of left triquetrum with routine healing, subsequent encounter 07/07/18     POST INJURY DAY: 6042  GLOBAL PERIOD DAY 32/90  She is doing well she is able to ambulate with a walker she has no pain tenderness or restriction of motion and her x-ray shows a healed fracture  I released her

## 2018-09-02 DIAGNOSIS — M159 Polyosteoarthritis, unspecified: Secondary | ICD-10-CM | POA: Diagnosis not present

## 2018-09-02 DIAGNOSIS — J449 Chronic obstructive pulmonary disease, unspecified: Secondary | ICD-10-CM | POA: Diagnosis not present

## 2018-09-02 DIAGNOSIS — I10 Essential (primary) hypertension: Secondary | ICD-10-CM | POA: Diagnosis not present

## 2018-10-13 DIAGNOSIS — J449 Chronic obstructive pulmonary disease, unspecified: Secondary | ICD-10-CM | POA: Diagnosis not present

## 2018-10-13 DIAGNOSIS — I10 Essential (primary) hypertension: Secondary | ICD-10-CM | POA: Diagnosis not present

## 2018-10-13 DIAGNOSIS — M159 Polyosteoarthritis, unspecified: Secondary | ICD-10-CM | POA: Diagnosis not present
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
## 2018-11-02 ENCOUNTER — Ambulatory Visit (INDEPENDENT_AMBULATORY_CARE_PROVIDER_SITE_OTHER): Payer: Medicare Other | Admitting: Psychiatry

## 2018-11-02 ENCOUNTER — Encounter (HOSPITAL_COMMUNITY): Payer: Self-pay | Admitting: Psychiatry

## 2018-11-02 VITALS — BP 150/84 | HR 69 | Ht 63.0 in | Wt 153.0 lb

## 2018-11-02 DIAGNOSIS — H401112 Primary open-angle glaucoma, right eye, moderate stage: Secondary | ICD-10-CM | POA: Diagnosis not present

## 2018-11-02 DIAGNOSIS — F330 Major depressive disorder, recurrent, mild: Secondary | ICD-10-CM | POA: Diagnosis not present

## 2018-11-02 DIAGNOSIS — Z970 Presence of artificial eye: Secondary | ICD-10-CM | POA: Diagnosis not present

## 2018-11-02 DIAGNOSIS — Z961 Presence of intraocular lens: Secondary | ICD-10-CM | POA: Diagnosis not present

## 2018-11-02 DIAGNOSIS — H35371 Puckering of macula, right eye: Secondary | ICD-10-CM | POA: Diagnosis not present

## 2018-11-02 DIAGNOSIS — H43811 Vitreous degeneration, right eye: Secondary | ICD-10-CM | POA: Diagnosis not present

## 2018-11-02 MED ORDER — CLONAZEPAM 1 MG PO TABS: ORAL_TABLET | ORAL | 3 refills | Status: DC

## 2018-11-02 MED ORDER — VENLAFAXINE HCL ER 150 MG PO CP24: 150.0000 mg | ORAL_CAPSULE | Freq: Every day | ORAL | 1 refills | Status: DC

## 2018-11-02 NOTE — Patient Instructions (Signed)
1. Continue venlafaxine 150 mg daily 2. Continue clonazepam 1 mg during the day and 0.5 mg at night 3. Return to clinic infourmonths for 30 mins

## 2018-11-06 DIAGNOSIS — G309 Alzheimer's disease, unspecified: Secondary | ICD-10-CM | POA: Diagnosis not present

## 2018-11-06 DIAGNOSIS — Z87891 Personal history of nicotine dependence: Secondary | ICD-10-CM | POA: Diagnosis not present

## 2018-11-06 DIAGNOSIS — K219 Gastro-esophageal reflux disease without esophagitis: Secondary | ICD-10-CM | POA: Diagnosis not present

## 2018-11-06 DIAGNOSIS — Z6829 Body mass index (BMI) 29.0-29.9, adult: Secondary | ICD-10-CM | POA: Diagnosis not present

## 2018-11-06 DIAGNOSIS — R002 Palpitations: Secondary | ICD-10-CM | POA: Diagnosis not present

## 2018-11-06 DIAGNOSIS — E2839 Other primary ovarian failure: Secondary | ICD-10-CM | POA: Diagnosis not present

## 2018-11-06 DIAGNOSIS — I10 Essential (primary) hypertension: Secondary | ICD-10-CM | POA: Diagnosis not present

## 2019-02-11 DIAGNOSIS — M159 Polyosteoarthritis, unspecified: Secondary | ICD-10-CM | POA: Diagnosis not present

## 2019-02-11 DIAGNOSIS — I10 Essential (primary) hypertension: Secondary | ICD-10-CM | POA: Diagnosis not present

## 2019-02-11 DIAGNOSIS — J449 Chronic obstructive pulmonary disease, unspecified: Secondary | ICD-10-CM | POA: Diagnosis not present

## 2019-02-19 ENCOUNTER — Other Ambulatory Visit (HOSPITAL_COMMUNITY): Payer: Self-pay | Admitting: Psychiatry

## 2019-02-19 MED ORDER — CLONAZEPAM 1 MG PO TABS: ORAL_TABLET | ORAL | 0 refills | Status: DC

## 2019-02-26 NOTE — Progress Notes (Signed)
Virtual Visit via Telephone Note  I connected with Bonnie Rush on 03/04/19 at  1:00 PM EDT by telephone and verified that I am speaking with the correct person using two identifiers.   I discussed the limitations, risks, security and privacy concerns of performing an evaluation and management service by telephone and the availability of in person appointments. I also discussed with the patient that there may be a patient responsible charge related to this service. The patient expressed understanding and agreed to proceed.       I discussed the assessment and treatment plan with the patient. The patient was provided an opportunity to ask questions and all were answered. The patient agreed with the plan and demonstrated an understanding of the instructions.   The patient was advised to call back or seek an in-person evaluation if the symptoms worsen or if the condition fails to improve as anticipated.  I provided 15 minutes of non-face-to-face time during this encounter.   [name], MD    [facility] MD/PA/NP OP Progress Note  03/04/2019 1:18 PM Bonnie Rush  MRN:  [id number]  Chief Complaint:  Chief Complaint    Depression; Follow-up     HPI:  This is a follow-up appointment for depression.  She states that she had a kidney infection since last week.  She also thought she would have appointment with ear doctor today.  She states that she tends to stay in the house most of the time since pandemic.  Her son helps her to get outside of the house; she enjoys cookout.  She also enjoys watching TV or reading a newspaper.  Although her daughter is busy, she contacts the patient on weekend.  She sleeps well.  She denies feeling depressed.  She has fair concentration.  She has good appetite.  She denies SI.  She occasionally feels anxious sometimes.  She denies irritability.  She denies panic attacks.    Visit Diagnosis:    ICD-10-CM   1. Mild episode of recurrent major depressive disorder  (HCC)  F33.0   2. Major neurocognitive disorder (HCC)  F01.50     Past Psychiatric History: Please see initial evaluation for full details. I have reviewed the history. No updates at this time.     Past Medical History:  Past Medical History:  Diagnosis Date  . Glaucoma   . High cholesterol   . Hypertension   . Irritable bowel syndrome   . Osteoporosis   . Palpitations     Past Surgical History:  Procedure Laterality Date  . ABDOMINAL HYSTERECTOMY    . BREAST SURGERY    . CHOLECYSTECTOMY    . EYE SURGERY    . ORIF PATELLA Left 01/19/2016   Procedure: OPEN REDUCTION INTERNAL (ORIF) FIXATION LEFT PATELLA  FRACTURE;  Surgeon: Vickki Hearing, MD;  Location: [facility];  Service: Orthopedics;  Laterality: Left;    Family Psychiatric History: Please see initial evaluation for full details. I have reviewed the history. No updates at this time.     Family History:  Family History  Problem Relation Age of Onset  . High blood pressure Mother   . Stroke Father     Social History:  Social History   Socioeconomic History  . Marital status: Widowed    Spouse name: Not on file  . Number of children: Not on file  . Years of education: Not on file  . Highest education level: Not on file  Occupational History  . Not on  file  Social Needs  . Financial resource strain: Not on file  . Food insecurity    Worry: Not on file    Inability: Not on file  . Transportation needs    Medical: Not on file    Non-medical: Not on file  Tobacco Use  . Smoking status: Never Smoker  . Smokeless tobacco: Never Used  Substance and Sexual Activity  . Alcohol use: No  . Drug use: No  . Sexual activity: Not Currently  Lifestyle  . Physical activity    Days per week: Not on file    Minutes per session: Not on file  . Stress: Not on file  Relationships  . Social Musician on phone: Not on file    Gets together: Not on file    Attends religious service: Not on file    Active  member of club or organization: Not on file    Attends meetings of clubs or organizations: Not on file    Relationship status: Not on file  Other Topics Concern  . Not on file  Social History Narrative  . Not on file    Allergies:  Allergies  Allergen Reactions  . Penicillins     Unknown-childhood allergy Pt tolerated Rocephin IV on 04/11/2018 (monitored in the ED)    Metabolic Disorder Labs: No results found for: HGBA1C, MPG No results found for: PROLACTIN No results found for: CHOL, TRIG, HDL, CHOLHDL, VLDL, LDLCALC No results found for: TSH  Therapeutic Level Labs: No results found for: LITHIUM No results found for: VALPROATE No components found for:  CBMZ  Current Medications: Current Outpatient Medications  Medication Sig Dispense Refill  . atorvastatin (LIPITOR) 20 MG tablet Take 20 mg by mouth every morning.     . Calcium Carb-Cholecalciferol (CALCIUM 600 + D PO) Take 1 tablet by mouth daily.    . cefpodoxime (VANTIN) 200 MG tablet Take 1 tablet (200 mg total) by mouth 2 (two) times daily. 20 tablet 0  . [START ON 04/02/2019] clonazePAM (KLONOPIN) 1 MG tablet Take 1 mg daily and 0.5 mg at night 45 tablet 2  . dexlansoprazole (DEXILANT) 60 MG capsule Take 60 mg by mouth daily.    . diphenoxylate-atropine (LOMOTIL) 2.5-0.025 MG tablet Take 1 tablet by mouth 4 (four) times daily as needed for diarrhea or loose stools.    . docusate sodium (COLACE) 100 MG capsule Take 1 capsule (100 mg total) by mouth 2 (two) times daily as needed for mild constipation. 20 capsule 0  . fexofenadine (ALLEGRA) 180 MG tablet Take 180 mg by mouth daily.    . hydrochlorothiazide (HYDRODIURIL) 25 MG tablet Take 25 mg by mouth daily.    Marland Kitchen latanoprost (XALATAN) 0.005 % ophthalmic solution Place 1 drop into both eyes at bedtime.    Marland Kitchen loperamide (IMODIUM) 2 MG capsule Take 2 mg by mouth as needed for diarrhea or loose stools.    . meclizine (ANTIVERT) 25 MG tablet Take 25 mg by mouth 3 (three) times  daily.     . potassium chloride (K-DUR) 10 MEQ tablet Take 1 tablet (10 mEq total) by mouth 2 (two) times daily. 10 tablet 0  . promethazine (PHENERGAN) 25 MG tablet Take 25 mg by mouth every 6 (six) hours as needed for nausea or vomiting.    . propranolol (INDERAL) 60 MG tablet Take 60 mg by mouth 2 (two) times daily.    . timolol (BETIMOL) 0.5 % ophthalmic solution Place 1 drop into  the right eye daily.    Melene Muller. [START ON 05/03/2019] venlafaxine XR (EFFEXOR-XR) 150 MG 24 hr capsule Take 1 capsule (150 mg total) by mouth daily with breakfast. 90 capsule 1   No current facility-administered medications for this visit.      Musculoskeletal: Strength & Muscle Tone: N/A Gait & Station: N/A Patient leans: N/A  Psychiatric Specialty Exam: Review of Systems  Psychiatric/Behavioral: Negative for depression, hallucinations, memory loss, substance abuse and suicidal ideas. The patient is nervous/anxious. The patient does not have insomnia.   All other systems reviewed and are negative.   There were no vitals taken for this visit.There is no height or weight on file to calculate BMI.  General Appearance: NA  Eye Contact:  NA  Speech:  Clear and Coherent  Volume:  Normal  Mood:  "fine"  Affect:  NA  Thought Process:  Coherent  Orientation:  Full (Time, Place, and Person)  Thought Content: Logical   Suicidal Thoughts:  No  Homicidal Thoughts:  No  Memory:  Immediate;   Good  Judgement:  Good  Insight:  Fair  Psychomotor Activity:  Normal  Concentration:  Concentration: Good and Attention Span: Good  Recall:  Good  Fund of Knowledge: Good  Language: Good  Akathisia:  No  Handed:  Right  AIMS (if indicated): not done  Assets:  Communication Skills Desire for Improvement  ADL's:  Intact  Cognition: WNL  Sleep:  Good   Screenings:  MOCA 11 on 05/2017. : +2 for drawing clock,+2 for naming, +2 for attention, +5 for orientation  Assessment and Plan:  Bonnie EmDoris H Osias is a 85 y.o. year old  female with a history of depression, neurocognitive disorder, hypertension,hyperlipidemia, irritable bowel syndrome, left patella fracture s/p ORIF , who presents for follow up appointment for depression.   # MDD, mild, recurrent # Unspecified anxiety disorder She reports overall improvement in anxiety since the last visit.  Will continue venlafaxine to target anxiety.  Will continue clonazepam as needed for anxiety.  Noted that although it has been strongly advised to avoid taking clonazepam given risk of falls and sedation, both the patient and her daughter reports significant benefit from this medication.  Discussed behavioral activation.   # major neurocognitive disorder- mild No change in her memory. Patient does have neurocognitive deficits as characterized by Moca.   She could not tolerate Aricept due to side effect of visual hallucinations.  Will continue to monitor.   Plan I have reviewed and updated plans as below 1. Continue venlafaxine 150 mg daily 2. Continue clonazepam 1 mg during the day and 0.5 mg at night 3.  Next appointment: 10/5 at 2 PM for 20 mins, phone [facility]  The patient demonstrates the following risk factors for suicide: Chronic risk factorsfor suicide include psychiatric disorder, demographic factors 67( >60 yo). Acute risk factorsfor suicide includenone.Protective factorsfor this patient include positive social support,hope for the future. Considering these factors, the overall suicide risk at this point appears to be low.  Neysa Hottereina Onur Mori, MD 03/04/2019, 1:18 PM

## 2019-03-04 ENCOUNTER — Encounter (HOSPITAL_COMMUNITY): Payer: Self-pay | Admitting: Psychiatry

## 2019-03-04 ENCOUNTER — Ambulatory Visit (INDEPENDENT_AMBULATORY_CARE_PROVIDER_SITE_OTHER): Payer: Medicare Other | Admitting: Psychiatry

## 2019-03-04 ENCOUNTER — Other Ambulatory Visit: Payer: Self-pay

## 2019-03-04 DIAGNOSIS — F0150 Vascular dementia without behavioral disturbance: Secondary | ICD-10-CM | POA: Diagnosis not present

## 2019-03-04 DIAGNOSIS — F330 Major depressive disorder, recurrent, mild: Secondary | ICD-10-CM | POA: Diagnosis not present

## 2019-03-04 DIAGNOSIS — F0390 Unspecified dementia without behavioral disturbance: Secondary | ICD-10-CM

## 2019-03-04 MED ORDER — VENLAFAXINE HCL ER 150 MG PO CP24: 150.0000 mg | ORAL_CAPSULE | Freq: Every day | ORAL | 1 refills | Status: DC

## 2019-03-04 MED ORDER — CLONAZEPAM 1 MG PO TABS: ORAL_TABLET | ORAL | 2 refills | Status: DC

## 2019-03-04 NOTE — Patient Instructions (Signed)
1. Continue venlafaxine 150 mg daily 2. Continue clonazepam 1 mg during the day and 0.5 mg at night 3.  Next appointment: 10/5 at 2 PM

## 2019-03-09 DIAGNOSIS — M159 Polyosteoarthritis, unspecified: Secondary | ICD-10-CM | POA: Diagnosis not present

## 2019-03-09 DIAGNOSIS — I10 Essential (primary) hypertension: Secondary | ICD-10-CM | POA: Diagnosis not present

## 2019-03-09 DIAGNOSIS — J449 Chronic obstructive pulmonary disease, unspecified: Secondary | ICD-10-CM | POA: Diagnosis not present

## 2019-04-14 DIAGNOSIS — M159 Polyosteoarthritis, unspecified: Secondary | ICD-10-CM | POA: Diagnosis not present

## 2019-04-14 DIAGNOSIS — I10 Essential (primary) hypertension: Secondary | ICD-10-CM | POA: Diagnosis not present

## 2019-04-14 DIAGNOSIS — J449 Chronic obstructive pulmonary disease, unspecified: Secondary | ICD-10-CM | POA: Diagnosis not present

## 2019-05-12 DIAGNOSIS — I10 Essential (primary) hypertension: Secondary | ICD-10-CM | POA: Diagnosis not present

## 2019-05-12 DIAGNOSIS — M159 Polyosteoarthritis, unspecified: Secondary | ICD-10-CM | POA: Diagnosis not present

## 2019-05-12 DIAGNOSIS — J449 Chronic obstructive pulmonary disease, unspecified: Secondary | ICD-10-CM | POA: Diagnosis not present

## 2019-06-01 ENCOUNTER — Other Ambulatory Visit (HOSPITAL_COMMUNITY): Payer: Self-pay | Admitting: Psychiatry

## 2019-06-01 MED ORDER — CLONAZEPAM 1 MG PO TABS: ORAL_TABLET | ORAL | 0 refills | Status: DC

## 2019-06-09 DIAGNOSIS — J449 Chronic obstructive pulmonary disease, unspecified: Secondary | ICD-10-CM | POA: Diagnosis not present

## 2019-06-09 DIAGNOSIS — M159 Polyosteoarthritis, unspecified: Secondary | ICD-10-CM | POA: Diagnosis not present

## 2019-06-09 DIAGNOSIS — I10 Essential (primary) hypertension: Secondary | ICD-10-CM | POA: Diagnosis not present

## 2019-06-21 NOTE — Progress Notes (Signed)
Virtual Visit via Telephone Note  I connected with Bonnie Rush on 06/28/19 at  2:00 PM EDT by telephone and verified that I am speaking with the correct person using two identifiers.   I discussed the limitations, risks, security and privacy concerns of performing an evaluation and management service by telephone and the availability of in person appointments. I also discussed with the patient that there may be a patient responsible charge related to this service. The patient expressed understanding and agreed to proceed.    I discussed the assessment and treatment plan with the patient. The patient was provided an opportunity to ask questions and all were answered. The patient agreed with the plan and demonstrated an understanding of the instructions.   The patient was advised to call back or seek an in-person evaluation if the symptoms worsen or if the condition fails to improve as anticipated.  I provided 15 minutes of non-face-to-face time during this encounter.   [name], MD    [facility] MD/PA/NP OP Progress Note  06/28/2019 2:25 PM Bonnie Rush  MRN:  [id number]  Chief Complaint:  Chief Complaint    Depression; Follow-up     HPI:  This is a follow-up appointment for depression and neurocognitive disorder.   She states that she has been feeling sick since last night.  She has an upcoming appointment with her provider tomorrow.  She lives in a trailer, and her son visits the patient every day. She has been trying to stay in a good mood. She reports that she contacted our office to talk with this examiner. She reportedly had intense anxiety when she talked with one of our staff. However, she denies any concern, stating that she just wanted to have a phone visit. She sleeps well. She denies feeling depressed. Although she occasionally has passive fleeting SI, she adamantly denies any intent or plan, stating that she has great grandchildren. She feels less anxious. She denies panic  attacks. She feels irritable at times. She has fair appetite. She does not report any concern about memory loss.    Visit Diagnosis:    ICD-10-CM   1. MDD (major depressive disorder), recurrent, in partial remission (HCC)  F33.41   2. Major neurocognitive disorder (HCC)  F01.50     Past Psychiatric History: Please see initial evaluation for full details. I have reviewed the history. No updates at this time.     Past Medical History:  Past Medical History:  Diagnosis Date  . Glaucoma   . High cholesterol   . Hypertension   . Irritable bowel syndrome   . Osteoporosis   . Palpitations     Past Surgical History:  Procedure Laterality Date  . ABDOMINAL HYSTERECTOMY    . BREAST SURGERY    . CHOLECYSTECTOMY    . EYE SURGERY    . ORIF PATELLA Left 01/19/2016   Procedure: OPEN REDUCTION INTERNAL (ORIF) FIXATION LEFT PATELLA  FRACTURE;  Surgeon: Vickki Hearing, MD;  Location: [facility];  Service: Orthopedics;  Laterality: Left;    Family Psychiatric History: Please see initial evaluation for full details. I have reviewed the history. No updates at this time.     Family History:  Family History  Problem Relation Age of Onset  . High blood pressure Mother   . Stroke Father     Social History:  Social History   Socioeconomic History  . Marital status: Widowed    Spouse name: Not on file  . Number of children:  Not on file  . Years of education: Not on file  . Highest education level: Not on file  Occupational History  . Not on file  Social Needs  . Financial resource strain: Not on file  . Food insecurity    Worry: Not on file    Inability: Not on file  . Transportation needs    Medical: Not on file    Non-medical: Not on file  Tobacco Use  . Smoking status: Never Smoker  . Smokeless tobacco: Never Used  Substance and Sexual Activity  . Alcohol use: No  . Drug use: No  . Sexual activity: Not Currently  Lifestyle  . Physical activity    Days per week: Not on  file    Minutes per session: Not on file  . Stress: Not on file  Relationships  . Social Musicianconnections    Talks on phone: Not on file    Gets together: Not on file    Attends religious service: Not on file    Active member of club or organization: Not on file    Attends meetings of clubs or organizations: Not on file    Relationship status: Not on file  Other Topics Concern  . Not on file  Social History Narrative  . Not on file    Allergies:  Allergies  Allergen Reactions  . Penicillins     Unknown-childhood allergy Pt tolerated Rocephin IV on 04/11/2018 (monitored in the ED)    Metabolic Disorder Labs: No results found for: HGBA1C, MPG No results found for: PROLACTIN No results found for: CHOL, TRIG, HDL, CHOLHDL, VLDL, LDLCALC No results found for: TSH  Therapeutic Level Labs: No results found for: LITHIUM No results found for: VALPROATE No components found for:  CBMZ  Current Medications: Current Outpatient Medications  Medication Sig Dispense Refill  . atorvastatin (LIPITOR) 20 MG tablet Take 20 mg by mouth every morning.     . Calcium Carb-Cholecalciferol (CALCIUM 600 + D PO) Take 1 tablet by mouth daily.    . cefpodoxime (VANTIN) 200 MG tablet Take 1 tablet (200 mg total) by mouth 2 (two) times daily. 20 tablet 0  . [START ON 07/07/2019] clonazePAM (KLONOPIN) 1 MG tablet Take 1 mg daily and 0.5 mg at night 45 tablet 3  . dexlansoprazole (DEXILANT) 60 MG capsule Take 60 mg by mouth daily.    . diphenoxylate-atropine (LOMOTIL) 2.5-0.025 MG tablet Take 1 tablet by mouth 4 (four) times daily as needed for diarrhea or loose stools.    . docusate sodium (COLACE) 100 MG capsule Take 1 capsule (100 mg total) by mouth 2 (two) times daily as needed for mild constipation. 20 capsule 0  . fexofenadine (ALLEGRA) 180 MG tablet Take 180 mg by mouth daily.    . hydrochlorothiazide (HYDRODIURIL) 25 MG tablet Take 25 mg by mouth daily.    Marland Kitchen. latanoprost (XALATAN) 0.005 % ophthalmic  solution Place 1 drop into both eyes at bedtime.    Marland Kitchen. loperamide (IMODIUM) 2 MG capsule Take 2 mg by mouth as needed for diarrhea or loose stools.    . meclizine (ANTIVERT) 25 MG tablet Take 25 mg by mouth 3 (three) times daily.     . potassium chloride (K-DUR) 10 MEQ tablet Take 1 tablet (10 mEq total) by mouth 2 (two) times daily. 10 tablet 0  . promethazine (PHENERGAN) 25 MG tablet Take 25 mg by mouth every 6 (six) hours as needed for nausea or vomiting.    . propranolol (INDERAL)  60 MG tablet Take 60 mg by mouth 2 (two) times daily.    . timolol (BETIMOL) 0.5 % ophthalmic solution Place 1 drop into the right eye daily.    Marland Kitchen venlafaxine XR (EFFEXOR-XR) 150 MG 24 hr capsule Take 1 capsule (150 mg total) by mouth daily with breakfast. 90 capsule 1   No current facility-administered medications for this visit.      Musculoskeletal: Strength & Muscle Tone: N/A Gait & Station: N/A Patient leans: N/A  Psychiatric Specialty Exam: Review of Systems  Psychiatric/Behavioral: Negative for depression, hallucinations, memory loss, substance abuse and suicidal ideas. The patient is nervous/anxious and has insomnia.   All other systems reviewed and are negative.   There were no vitals taken for this visit.There is no height or weight on file to calculate BMI.  General Appearance: NA  Eye Contact:  NA  Speech:  Clear and Coherent  Volume:  Normal  Mood:  "fine"  Affect:  NA  Thought Process:  Coherent  Orientation:  Full (Time, Place, and Person)  Thought Content: Logical   Suicidal Thoughts:  No  Homicidal Thoughts:  No  Memory:  Immediate;   Good  Judgement:  Good  Insight:  Fair  Psychomotor Activity:  Normal  Concentration:  Concentration: Fair and Attention Span: Fair  Recall:  [place] of Knowledge: Good  Language: Good  Akathisia:  No  Handed:  Right  AIMS (if indicated): not done  Assets:  Communication Skills Desire for Improvement  ADL's:  Intact  Cognition: WNL   Sleep:  Good   Screenings: MOCA 11 on 05/2017. : +2 for drawing clock,+2 for naming, +2 for attention, +5 for orientation  Assessment and Plan:  Bonnie Rush is a 85 y.o. year old female with a history of depression, neurocognitive disorder, hypertension,hyperlipidemia, irritable bowel syndrome, left patella fracture s/p ORIF , who presents for follow up appointment for MDD (major depressive disorder), recurrent, in partial remission ([place])  Major neurocognitive disorder ([place])   # MDD in partial remission, depression # Unspecified anxiety disorder There has been overall improvement in depressive symptoms and anxiety since the last visit.  Will continue venlafaxine to target anxiety.  We will continue clonazepam as needed for anxiety.  Noted that although it has been strongly advised to avoid taking clonazepam given risk of falls and sedation, both the patient and her daughter report significant benefit from this medication.  Discussed behavioral activation.   # Major neurocognitive disorder- mild No significant change in her memory.  She does have neurocognitive deficits that is characterized by [place].  She cannot correlate Aricept due to side effect of visual hallucinations.  We will continue to monitor.   Plan I have reviewed and updated plans as below 1. Continue venlafaxine 150 mg daily 2. Continue clonazepam 1 mg during the day and 0.5 mg at night  3.Next appointment: in January (Aricept discontinued due to [facility])  The patient demonstrates the following risk factors for suicide: Chronic risk factorsfor suicide include psychiatric disorder, demographic factors ( >12 yo). Acute risk factorsfor suicide includenone.Protective factorsfor this patient include positive social support,hope for the future. Considering these factors, the overall suicide risk at this point appears to be low.   Norman Clay, MD 06/28/2019, 2:25 PM

## 2019-06-23 IMAGING — CT CT ORBITS W/ CM
3 series · 14 of 47 positions shown, 16 images · IV contrast (Isovue)
Comparison: CT head 11/04/2015.

CLINICAL DATA: LEFT eye prosthesis, suspected infection.

EXAM:
CT ORBITS WITH CONTRAST
TECHNIQUE: Multidetector CT images was performed according to the standard
protocol following intravenous contrast administration.
CONTRAST:  60mL OMNIPAQUE IOHEXOL 300 MG/ML  SOLN

[Series 3: orbits 2.0 h30s st · axial · 0.33mm/px · z∈[+818,+896]mm · 8 of 47 slices shown, 10 images]
[im 4/47  brain]
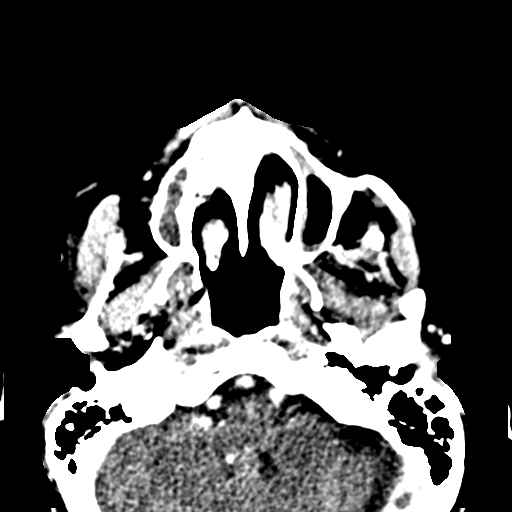
[im 4/47  bone]
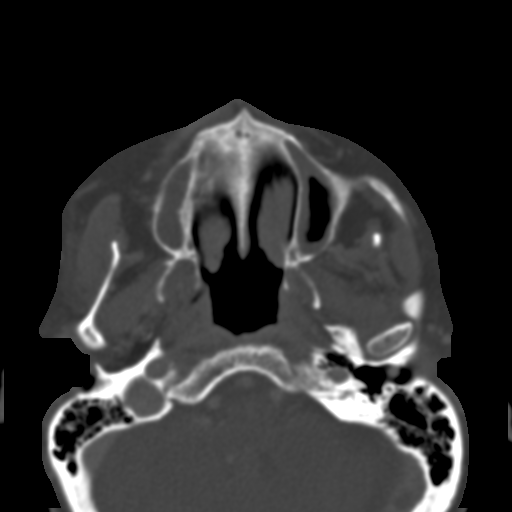
[im 10/47  bone]
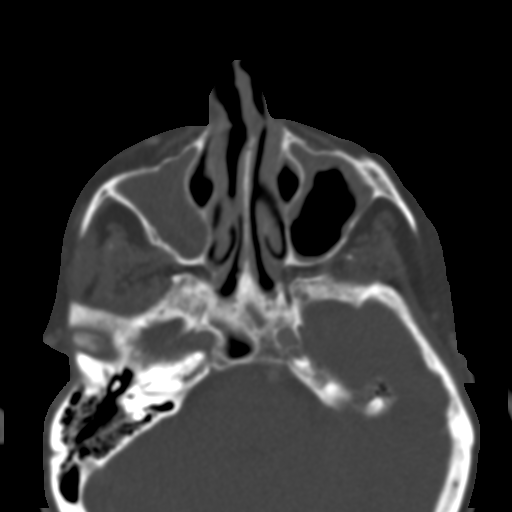
[im 15/47  bone]
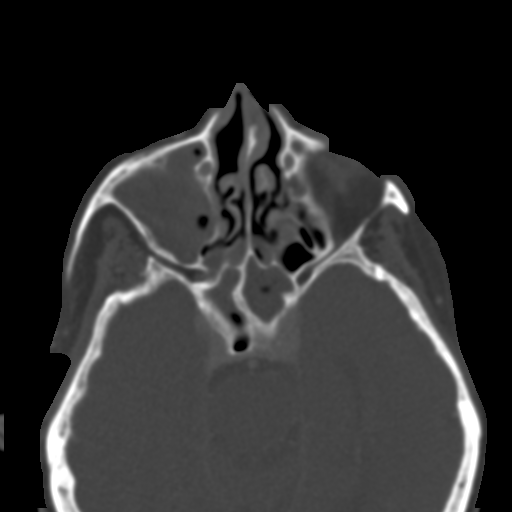
[im 21/47  bone]
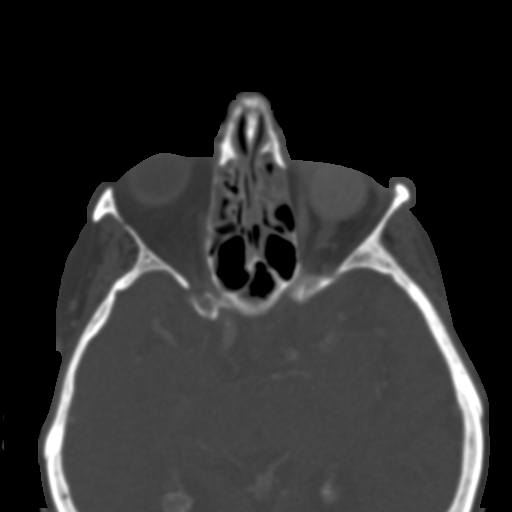
[im 26/47  brain]
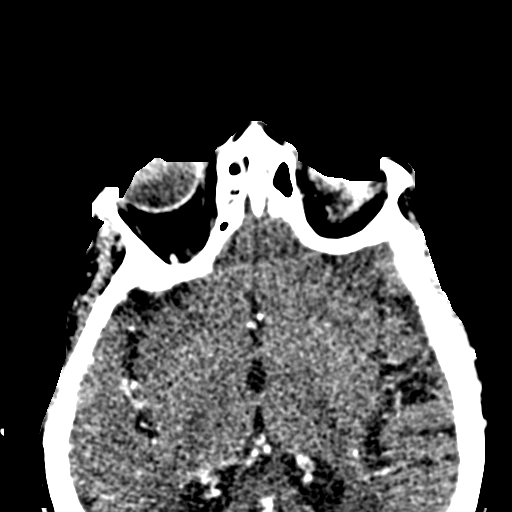
[im 26/47  bone]
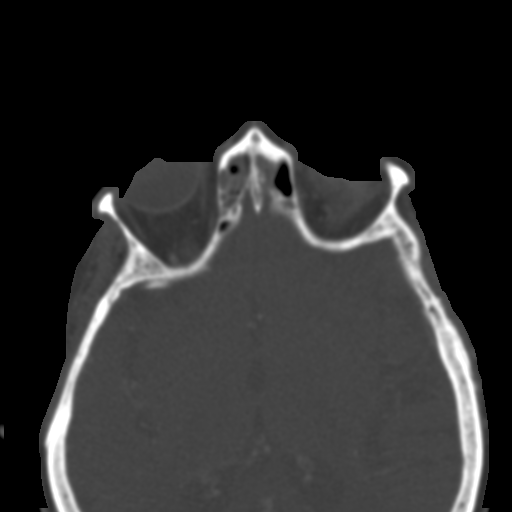
[im 32/47  bone]
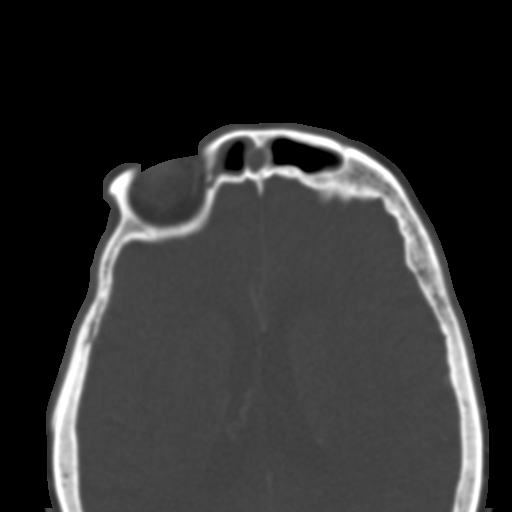
[im 37/47  bone]
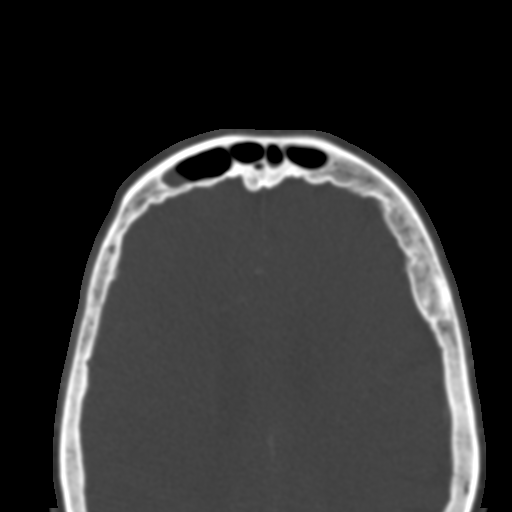
[im 43/47  bone]
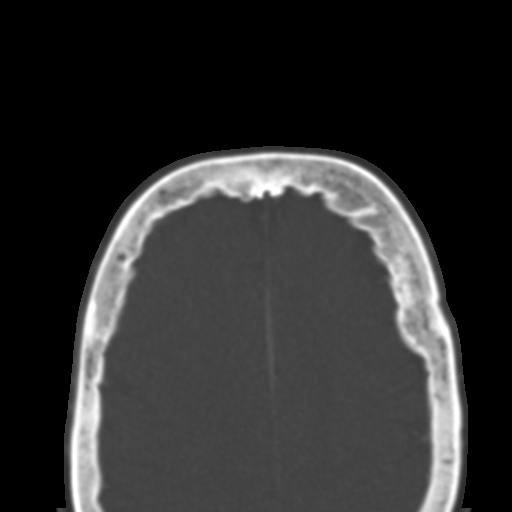

[Series 8: orbits 2.0 coronal · coronal · 0.18mm/px · 3 of 59 slices shown]
[im 20/59  bone]
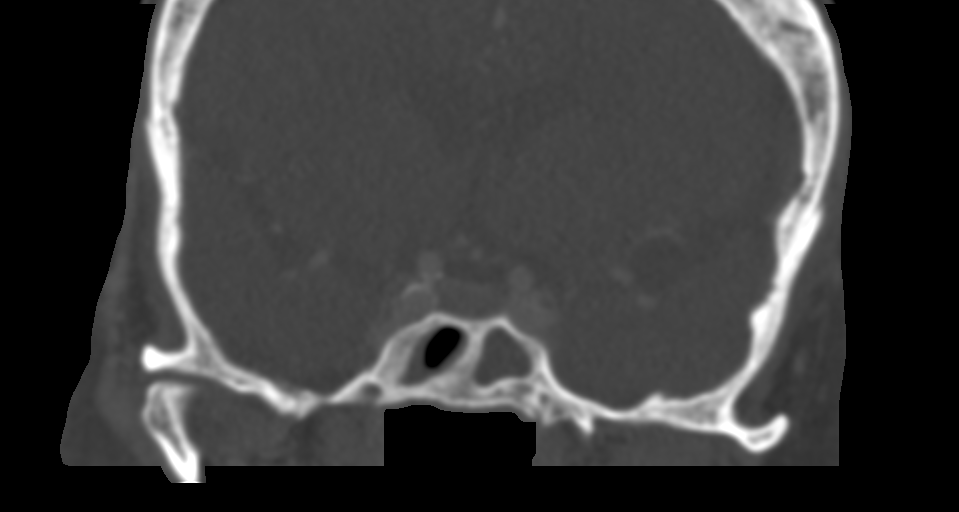
[im 26/59  bone]
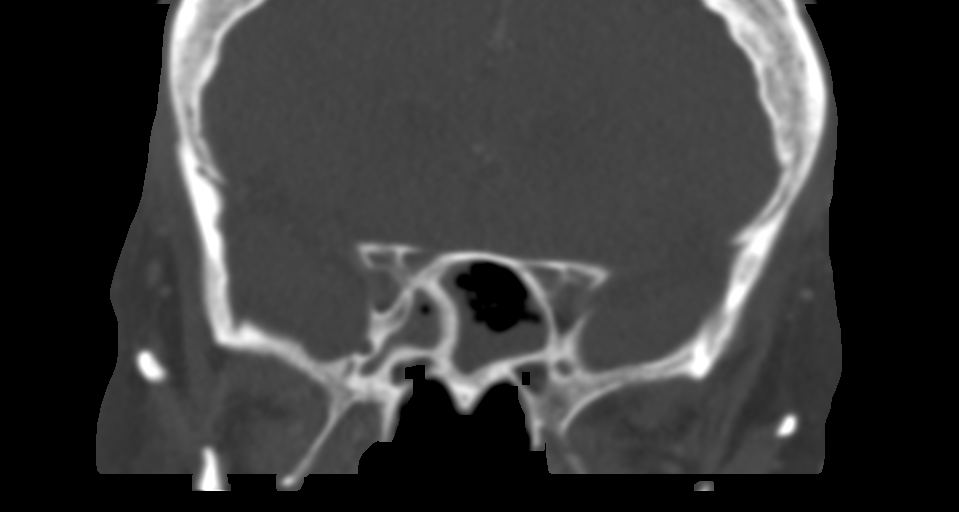
[im 33/59  bone]
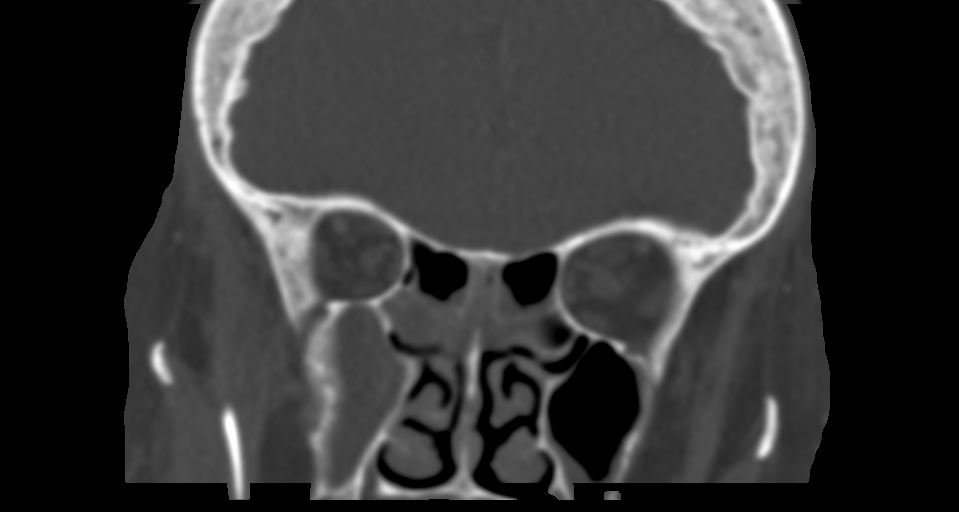

[Series 9: orbits 2.0 sagittal · sagittal · 0.19mm/px · 3 of 75 slices shown]
[im 25/75  bone]
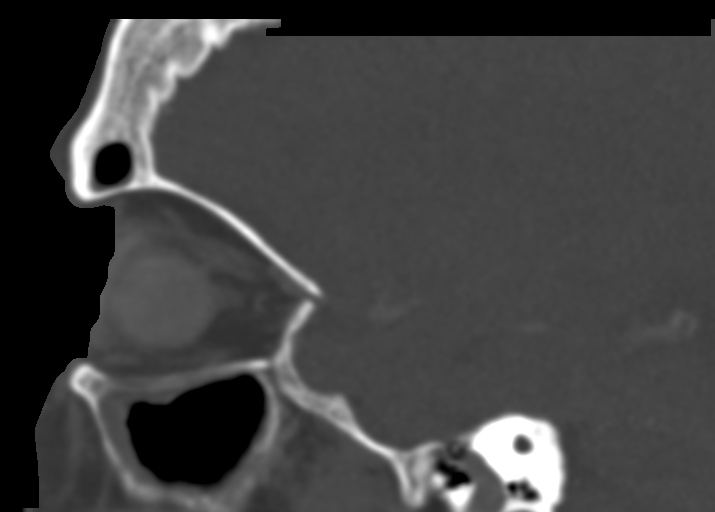
[im 38/75  bone]
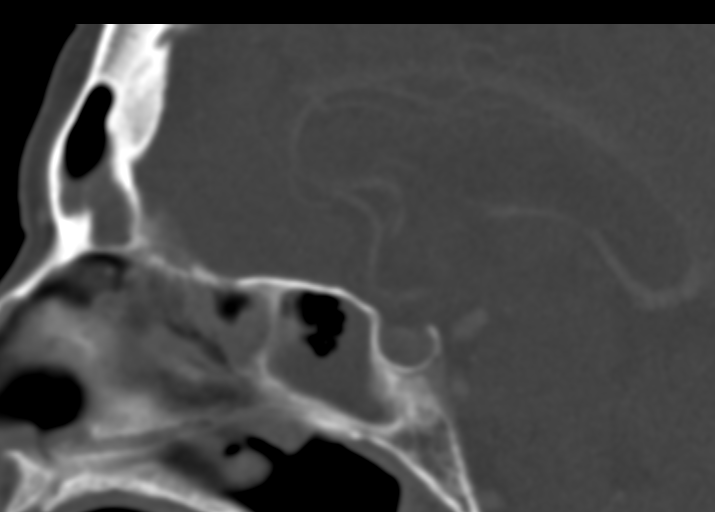
[im 50/75  bone]
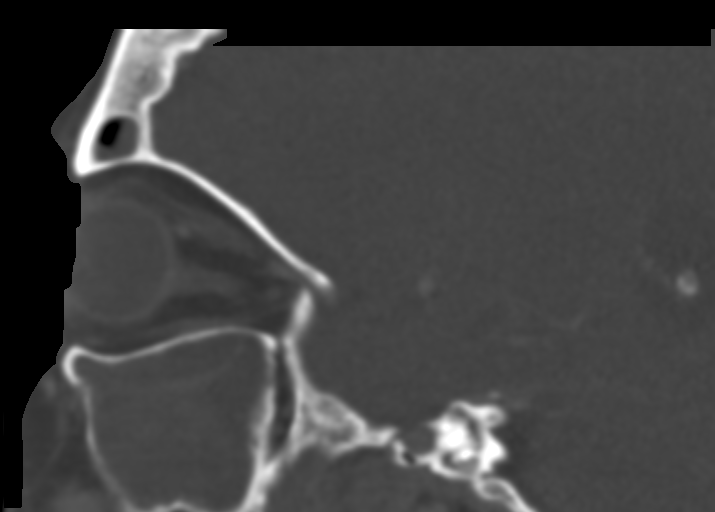

[14 of 47 positions shown; findings below may reference images not displayed]

FINDINGS: Orbits: The native LEFT globe is radiodense, presumed vitreous
hemorrhage. The orbital prosthesis which normally lies over the
globe is not present currently. There is superficial cellulitic
change of what can be best characterized as preseptal soft tissue.
There is no visible LEFT orbital abscess or postseptal inflammation.
RIGHT orbit negative.

Visualized sinuses: Mild fluid in the sinuses. Significant filling
of the RIGHT maxillary sinus, and mucosal thickening of the LEFT
maxillary sinus. Mucosal thickening of the RIGHT frontal sinus.
BILATERAL ethmoid fluid. BILATERAL sphenoid sinus mucosal
thickening, some layering fluid on the LEFT.

Soft tissues: Unremarkable.

Limited intracranial: Atrophy.  Dolichoectatic vessels.
IMPRESSION: LEFT preseptal periorbital superficial cellulitis of the soft
tissues which overlie the native radiodense LEFT globe. The
prosthesis is currently not within the orbit. There is no postseptal
inflammation or orbital abscess.

Chronic pansinusitis, but no significant sinus pathology affecting
the LEFT orbit.

## 2019-06-28 ENCOUNTER — Other Ambulatory Visit: Payer: Self-pay

## 2019-06-28 ENCOUNTER — Encounter (HOSPITAL_COMMUNITY): Payer: Self-pay | Admitting: Psychiatry

## 2019-06-28 ENCOUNTER — Ambulatory Visit (INDEPENDENT_AMBULATORY_CARE_PROVIDER_SITE_OTHER): Payer: Medicare Other | Admitting: Psychiatry

## 2019-06-28 DIAGNOSIS — F3341 Major depressive disorder, recurrent, in partial remission: Secondary | ICD-10-CM

## 2019-06-28 DIAGNOSIS — F0150 Vascular dementia without behavioral disturbance: Secondary | ICD-10-CM | POA: Diagnosis not present

## 2019-06-28 DIAGNOSIS — F0390 Unspecified dementia without behavioral disturbance: Secondary | ICD-10-CM

## 2019-06-28 MED ORDER — CLONAZEPAM 1 MG PO TABS: ORAL_TABLET | ORAL | 3 refills | Status: DC

## 2019-06-28 NOTE — Patient Instructions (Signed)
1. Continue venlafaxine 150 mg daily 2. Continue clonazepam 1 mg during the day and 0.5 mg at night  3.Next appointment: in January

## 2019-07-07 DIAGNOSIS — M159 Polyosteoarthritis, unspecified: Secondary | ICD-10-CM | POA: Diagnosis not present

## 2019-07-07 DIAGNOSIS — I10 Essential (primary) hypertension: Secondary | ICD-10-CM | POA: Diagnosis not present

## 2019-07-07 DIAGNOSIS — J449 Chronic obstructive pulmonary disease, unspecified: Secondary | ICD-10-CM | POA: Diagnosis not present

## 2019-07-16 DIAGNOSIS — Z299 Encounter for prophylactic measures, unspecified: Secondary | ICD-10-CM | POA: Diagnosis not present

## 2019-07-16 DIAGNOSIS — J449 Chronic obstructive pulmonary disease, unspecified: Secondary | ICD-10-CM | POA: Diagnosis not present

## 2019-07-16 DIAGNOSIS — J309 Allergic rhinitis, unspecified: Secondary | ICD-10-CM | POA: Diagnosis not present

## 2019-07-16 DIAGNOSIS — I10 Essential (primary) hypertension: Secondary | ICD-10-CM | POA: Diagnosis not present

## 2019-07-16 DIAGNOSIS — R32 Unspecified urinary incontinence: Secondary | ICD-10-CM | POA: Diagnosis not present

## 2019-07-16 DIAGNOSIS — G309 Alzheimer's disease, unspecified: Secondary | ICD-10-CM | POA: Diagnosis not present

## 2019-07-16 DIAGNOSIS — Z6829 Body mass index (BMI) 29.0-29.9, adult: Secondary | ICD-10-CM | POA: Diagnosis not present

## 2019-07-21 DIAGNOSIS — R350 Frequency of micturition: Secondary | ICD-10-CM | POA: Diagnosis not present

## 2019-07-28 DIAGNOSIS — Z23 Encounter for immunization: Secondary | ICD-10-CM | POA: Diagnosis not present

## 2019-08-16 DIAGNOSIS — Z970 Presence of artificial eye: Secondary | ICD-10-CM | POA: Diagnosis not present

## 2019-08-16 DIAGNOSIS — H43811 Vitreous degeneration, right eye: Secondary | ICD-10-CM | POA: Diagnosis not present

## 2019-08-16 DIAGNOSIS — Z961 Presence of intraocular lens: Secondary | ICD-10-CM | POA: Diagnosis not present

## 2019-08-16 DIAGNOSIS — H401112 Primary open-angle glaucoma, right eye, moderate stage: Secondary | ICD-10-CM | POA: Diagnosis not present

## 2019-08-16 DIAGNOSIS — H35371 Puckering of macula, right eye: Secondary | ICD-10-CM | POA: Diagnosis not present

## 2019-08-24 DIAGNOSIS — I10 Essential (primary) hypertension: Secondary | ICD-10-CM | POA: Diagnosis not present

## 2019-08-24 DIAGNOSIS — J449 Chronic obstructive pulmonary disease, unspecified: Secondary | ICD-10-CM | POA: Diagnosis not present

## 2019-08-24 DIAGNOSIS — M159 Polyosteoarthritis, unspecified: Secondary | ICD-10-CM | POA: Diagnosis not present

## 2019-08-30 ENCOUNTER — Other Ambulatory Visit (HOSPITAL_COMMUNITY): Payer: Self-pay | Admitting: Psychiatry

## 2019-08-30 MED ORDER — VENLAFAXINE HCL ER 150 MG PO CP24: 150.0000 mg | ORAL_CAPSULE | Freq: Every day | ORAL | 1 refills | Status: DC

## 2019-10-01 DIAGNOSIS — J449 Chronic obstructive pulmonary disease, unspecified: Secondary | ICD-10-CM | POA: Diagnosis not present

## 2019-10-01 DIAGNOSIS — M159 Polyosteoarthritis, unspecified: Secondary | ICD-10-CM | POA: Diagnosis not present

## 2019-10-01 DIAGNOSIS — I10 Essential (primary) hypertension: Secondary | ICD-10-CM | POA: Diagnosis not present

## 2019-10-04 ENCOUNTER — Ambulatory Visit (HOSPITAL_COMMUNITY): Payer: Medicare Other | Admitting: Psychiatry

## 2019-10-11 ENCOUNTER — Telehealth (HOSPITAL_COMMUNITY): Payer: Self-pay | Admitting: *Deleted

## 2019-10-11 NOTE — Telephone Encounter (Signed)
PATIENT CALLED THE BILLING OFFICE ON Friday STATED SHE COULDN'T REACH [place] & SHE NEED A REFILL. IT WAS AFTER HOURS ON Friday . I ATTEMPTED CALL BACK TO PATIENT ON Monday 10/11/2019 TO FOLLOW UP ON HER CONCERN FOR REFILLS? PATIENT HAS REFILLS AVAILABLE @ HER Rx. NO ANSWER & NO VM

## 2019-10-13 ENCOUNTER — Ambulatory Visit: Payer: Medicare Other | Admitting: Psychiatry

## 2019-10-15 ENCOUNTER — Telehealth (HOSPITAL_COMMUNITY): Payer: Self-pay | Admitting: Psychiatry

## 2019-10-15 MED ORDER — CLONAZEPAM 1 MG PO TABS: ORAL_TABLET | ORAL | 0 refills | Status: DC

## 2019-10-15 NOTE — Telephone Encounter (Signed)
Received communication that the patient is requesting refill for clonazepam.  Prescription sent.

## 2019-10-20 ENCOUNTER — Ambulatory Visit: Payer: Self-pay | Admitting: Psychiatry

## 2019-10-29 DIAGNOSIS — H6120 Impacted cerumen, unspecified ear: Secondary | ICD-10-CM | POA: Diagnosis not present

## 2019-10-29 DIAGNOSIS — H60313 Diffuse otitis externa, bilateral: Secondary | ICD-10-CM | POA: Diagnosis not present

## 2019-11-04 DIAGNOSIS — Z23 Encounter for immunization: Secondary | ICD-10-CM | POA: Diagnosis not present

## 2019-11-09 ENCOUNTER — Telehealth (HOSPITAL_COMMUNITY): Payer: Self-pay | Admitting: *Deleted

## 2019-11-09 ENCOUNTER — Ambulatory Visit: Payer: Medicare Other | Admitting: Psychiatry

## 2019-11-09 ENCOUNTER — Other Ambulatory Visit: Payer: Self-pay

## 2019-11-09 NOTE — Telephone Encounter (Signed)
Dr Vanetta Shawl patient NO SHOWED AM APPT.  & Dr. Evelene Croon would like for you to know>> She called a few  weeks ago for clonazepam refill. I dont think we should refill her scripts with out appointment in future

## 2019-11-09 NOTE — Telephone Encounter (Signed)
Noted, thanks!

## 2019-11-10 DIAGNOSIS — J449 Chronic obstructive pulmonary disease, unspecified: Secondary | ICD-10-CM | POA: Diagnosis not present

## 2019-11-10 DIAGNOSIS — M159 Polyosteoarthritis, unspecified: Secondary | ICD-10-CM | POA: Diagnosis not present

## 2019-11-10 DIAGNOSIS — I10 Essential (primary) hypertension: Secondary | ICD-10-CM | POA: Diagnosis not present

## 2019-11-10 NOTE — Progress Notes (Signed)
Virtual Visit via Telephone Note  I connected with Bonnie Rush on 11/16/19 at  2:30 PM EST by telephone and verified that I am speaking with the correct person using two identifiers.   I discussed the limitations, risks, security and privacy concerns of performing an evaluation and management service by telephone and the availability of in person appointments. I also discussed with the patient that there may be a patient responsible charge related to this service. The patient expressed understanding and agreed to proceed.    I discussed the assessment and treatment plan with the patient. The patient was provided an opportunity to ask questions and all were answered. The patient agreed with the plan and demonstrated an understanding of the instructions.   The patient was advised to call back or seek an in-person evaluation if the symptoms worsen or if the condition fails to improve as anticipated.  I provided 15 minutes of non-face-to-face time during this encounter.   [name], MD    [facility] MD/PA/NP OP Progress Note  11/16/2019 2:41 PM Bonnie Rush  MRN:  [id number]  Chief Complaint:  Chief Complaint    Depression; Follow-up     HPI:  This is a follow-up appointment for depression and neurocognitive disorder.  She states that she had a kidney infection.  She also reports her frustration of having limiting vision.  She enjoys listening to country music and possibly music.  She enjoys watching news.  She talks about a recent visit to her primary care about IBS.  She feels occasionally down.  She has fair appetite.  She has fair energy and concentration.  She denies SI.  She denies panic attacks.  She occasionally feels anxious.   Johnny, her son presents to the interview.  He believes she is doing "okay" except that she struggles with symptoms of IBS. He and her other son visits her and helps her as much as they can. Although she can be anxious at times, it has been good. He  denies any safety concern. She takes care of her medication. He denies any concern of her adherence to medication.   Functional Status Instrumental Activities of Daily Living (IADLs):  Bonnie Rush is independent in the following: medications  Requires assistance with the following: manage finances  Activities of Daily Living (ADLs):  Bonnie Rush is independent in the following: bathing and hygiene, feeding, continence, grooming and toileting, walking  Visit Diagnosis:    ICD-10-CM   1. MDD (major depressive disorder), recurrent, in partial remission (HCC)  F33.41   2. Major neurocognitive disorder (HCC)  F01.50     Past Psychiatric History: Please see initial evaluation for full details. I have reviewed the history. No updates at this time.     Past Medical History:  Past Medical History:  Diagnosis Date  . Glaucoma   . High cholesterol   . Hypertension   . Irritable bowel syndrome   . Osteoporosis   . Palpitations     Past Surgical History:  Procedure Laterality Date  . ABDOMINAL HYSTERECTOMY    . BREAST SURGERY    . CHOLECYSTECTOMY    . EYE SURGERY    . ORIF PATELLA Left 01/19/2016   Procedure: OPEN REDUCTION INTERNAL (ORIF) FIXATION LEFT PATELLA  FRACTURE;  Surgeon: Vickki Hearing, MD;  Location: [facility];  Service: Orthopedics;  Laterality: Left;    Family Psychiatric History: Please see initial evaluation for full details. I have reviewed the history. No updates at this  time.     Family History:  Family History  Problem Relation Age of Onset  . High blood pressure Mother   . Stroke Father     Social History:  Social History   Socioeconomic History  . Marital status: Widowed    Spouse name: Not on file  . Number of children: Not on file  . Years of education: Not on file  . Highest education level: Not on file  Occupational History  . Not on file  Tobacco Use  . Smoking status: Never Smoker  . Smokeless tobacco: Never Used  Substance and  Sexual Activity  . Alcohol use: No  . Drug use: No  . Sexual activity: Not Currently  Other Topics Concern  . Not on file  Social History Narrative  . Not on file   Social Determinants of Health   Financial Resource Strain:   . Difficulty of Paying Living Expenses: Not on file  Food Insecurity:   . Worried About Programme researcher, broadcasting/film/video in the Last Year: Not on file  . Ran Out of Food in the Last Year: Not on file  Transportation Needs:   . Lack of Transportation (Medical): Not on file  . Lack of Transportation (Non-Medical): Not on file  Physical Activity:   . Days of Exercise per Week: Not on file  . Minutes of Exercise per Session: Not on file  Stress:   . Feeling of Stress : Not on file  Social Connections:   . Frequency of Communication with Friends and Family: Not on file  . Frequency of Social Gatherings with Friends and Family: Not on file  . Attends Religious Services: Not on file  . Active Member of Clubs or Organizations: Not on file  . Attends Banker Meetings: Not on file  . Marital Status: Not on file    Allergies:  Allergies  Allergen Reactions  . Penicillins     Unknown-childhood allergy Pt tolerated Rocephin IV on 04/11/2018 (monitored in the ED)    Metabolic Disorder Labs: No results found for: HGBA1C, MPG No results found for: PROLACTIN No results found for: CHOL, TRIG, HDL, CHOLHDL, VLDL, LDLCALC No results found for: TSH  Therapeutic Level Labs: No results found for: LITHIUM No results found for: VALPROATE No components found for:  CBMZ  Current Medications: Current Outpatient Medications  Medication Sig Dispense Refill  . atorvastatin (LIPITOR) 20 MG tablet Take 20 mg by mouth every morning.     . Calcium Carb-Cholecalciferol (CALCIUM 600 + D PO) Take 1 tablet by mouth daily.    . cefpodoxime (VANTIN) 200 MG tablet Take 1 tablet (200 mg total) by mouth 2 (two) times daily. 20 tablet 0  . clonazePAM (KLONOPIN) 1 MG tablet Take 1 mg  daily and 0.5 mg at night 45 tablet 0  . dexlansoprazole (DEXILANT) 60 MG capsule Take 60 mg by mouth daily.    . diphenoxylate-atropine (LOMOTIL) 2.5-0.025 MG tablet Take 1 tablet by mouth 4 (four) times daily as needed for diarrhea or loose stools.    . docusate sodium (COLACE) 100 MG capsule Take 1 capsule (100 mg total) by mouth 2 (two) times daily as needed for mild constipation. 20 capsule 0  . fexofenadine (ALLEGRA) 180 MG tablet Take 180 mg by mouth daily.    . hydrochlorothiazide (HYDRODIURIL) 25 MG tablet Take 25 mg by mouth daily.    Marland Kitchen latanoprost (XALATAN) 0.005 % ophthalmic solution Place 1 drop into both eyes at bedtime.    Marland Kitchen  loperamide (IMODIUM) 2 MG capsule Take 2 mg by mouth as needed for diarrhea or loose stools.    . meclizine (ANTIVERT) 25 MG tablet Take 25 mg by mouth 3 (three) times daily.     . potassium chloride (K-DUR) 10 MEQ tablet Take 1 tablet (10 mEq total) by mouth 2 (two) times daily. 10 tablet 0  . promethazine (PHENERGAN) 25 MG tablet Take 25 mg by mouth every 6 (six) hours as needed for nausea or vomiting.    . propranolol (INDERAL) 60 MG tablet Take 60 mg by mouth 2 (two) times daily.    . timolol (BETIMOL) 0.5 % ophthalmic solution Place 1 drop into the right eye daily.    Marland Kitchen venlafaxine XR (EFFEXOR-XR) 150 MG 24 hr capsule Take 1 capsule (150 mg total) by mouth daily with breakfast. 90 capsule 1   No current facility-administered medications for this visit.     Musculoskeletal: Strength & Muscle Tone: N/A Gait & Station: N/A Patient leans: N/A  Psychiatric Specialty Exam: Review of Systems  Psychiatric/Behavioral: Negative for agitation, behavioral problems, confusion, decreased concentration, dysphoric mood, hallucinations, self-injury, sleep disturbance and suicidal ideas. The patient is nervous/anxious. The patient is not hyperactive.   All other systems reviewed and are negative.   There were no vitals taken for this visit.There is no height or  weight on file to calculate BMI.  General Appearance: NA  Eye Contact:  NA  Speech:  Clear and Coherent  Volume:  Normal  Mood:  "fine"  Affect:  NA  Thought Process:  Coherent  Orientation:  Full (Time, Place, and Person)  Thought Content: Logical   Suicidal Thoughts:  No  Homicidal Thoughts:  No  Memory:  Immediate;   Good  Judgement:  Good  Insight:  Fair  Psychomotor Activity:  Normal  Concentration:  Concentration: Good and Attention Span: Good  Recall:  Good  Fund of Knowledge: Good  Language: Good  Akathisia:  No  Handed:  Right  AIMS (if indicated): not done  Assets:  Communication Skills Desire for Improvement  ADL's:  Intact  Cognition: WNL  Sleep:  Fair   Screenings: MOCA 11 on 05/2017. : +2 for drawing clock,+2 for naming, +2 for attention, +5 for orientation   Assessment and Plan:  Bonnie Rush is a 86 y.o. year old female with a history of depression, neurocognitive disorder, hypertension,hyperlipidemia, irritable bowel syndrome, left patella fracture s/p ORIF , who presents for follow up appointment for MDD (major depressive disorder), recurrent, in partial remission (HCC)  Major neurocognitive disorder (HCC)  # MDD in partial remission, recurrent  # Unspecified anxiety disorder Although she reports occasional anxiety and depressive symptoms, it has been stable overall since the last visit. Will continue venlafaxine to target anxiety. Will continue clonazepam prn for anxiety. Noted that it has been discussed many times/strongly advised to avoid taking clonazepam given risk of falls and sedation, both the patient and her daughter reports strong benefit from this medication. Will closely monitor any side effect.   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 No significant change in her cognitive function.  She could not tolerate Aricept due to side effect of visual hallucinations.  We will continue to  monitor.   Plan I have reviewed and updated plans as below 1. Continue venlafaxine 150 mg daily 2. Continue clonazepam 1 mg during the day and 0.5 mg at night  3.Next appointment: 5/18 at 2:30 for 20 mins, phone (Aricept  discontinued due to [facility])  The patient demonstrates the following risk factors for suicide: Chronic risk factorsfor suicide include psychiatric disorder, demographic factors ( >52 yo). Acute risk factorsfor suicide includenone.Protective factorsfor this patient include positive social support,hope for the future. Considering these factors, the overall suicide risk at this point appears to be low.  Norman Clay, MD 11/16/2019, 2:41 PM

## 2019-11-16 ENCOUNTER — Other Ambulatory Visit: Payer: Self-pay

## 2019-11-16 ENCOUNTER — Encounter (HOSPITAL_COMMUNITY): Payer: Self-pay | Admitting: Psychiatry

## 2019-11-16 ENCOUNTER — Ambulatory Visit (INDEPENDENT_AMBULATORY_CARE_PROVIDER_SITE_OTHER): Payer: Medicare Other | Admitting: Psychiatry

## 2019-11-16 DIAGNOSIS — F0150 Vascular dementia without behavioral disturbance: Secondary | ICD-10-CM | POA: Diagnosis not present

## 2019-11-16 DIAGNOSIS — F0390 Unspecified dementia without behavioral disturbance: Secondary | ICD-10-CM

## 2019-11-16 DIAGNOSIS — F3341 Major depressive disorder, recurrent, in partial remission: Secondary | ICD-10-CM | POA: Diagnosis not present

## 2019-11-16 MED ORDER — CLONAZEPAM 1 MG PO TABS: ORAL_TABLET | ORAL | 1 refills | Status: DC

## 2019-11-16 NOTE — Patient Instructions (Signed)
1. Continue venlafaxine 150 mg daily 2. Continue clonazepam 1 mg during the day and 0.5 mg at night  3.Next appointment: 5/18 at 2:30

## 2019-12-02 DIAGNOSIS — Z23 Encounter for immunization: Secondary | ICD-10-CM | POA: Diagnosis not present

## 2019-12-06 DIAGNOSIS — J449 Chronic obstructive pulmonary disease, unspecified: Secondary | ICD-10-CM | POA: Diagnosis not present

## 2019-12-06 DIAGNOSIS — I10 Essential (primary) hypertension: Secondary | ICD-10-CM | POA: Diagnosis not present

## 2019-12-06 DIAGNOSIS — M159 Polyosteoarthritis, unspecified: Secondary | ICD-10-CM | POA: Diagnosis not present

## 2019-12-27 DIAGNOSIS — M159 Polyosteoarthritis, unspecified: Secondary | ICD-10-CM | POA: Diagnosis not present

## 2019-12-27 DIAGNOSIS — I10 Essential (primary) hypertension: Secondary | ICD-10-CM | POA: Diagnosis not present

## 2019-12-27 DIAGNOSIS — J449 Chronic obstructive pulmonary disease, unspecified: Secondary | ICD-10-CM | POA: Diagnosis not present

## 2020-01-14 ENCOUNTER — Telehealth (HOSPITAL_COMMUNITY): Payer: Self-pay | Admitting: *Deleted

## 2020-01-14 ENCOUNTER — Other Ambulatory Visit (HOSPITAL_COMMUNITY): Payer: Self-pay | Admitting: Psychiatry

## 2020-01-14 MED ORDER — CLONAZEPAM 1 MG PO TABS: ORAL_TABLET | ORAL | 0 refills | Status: DC

## 2020-01-14 NOTE — Telephone Encounter (Signed)
Ordered

## 2020-01-14 NOTE — Telephone Encounter (Signed)
[facility] FAXED REFILL REQUEST CONTROLLED SUBSTANCE clonazePAM (KLONOPIN) 1 MG tablet

## 2020-02-02 NOTE — Progress Notes (Deleted)
[facility] MD/PA/NP OP Progress Note  02/02/2020 3:17 PM Bonnie Rush  MRN:  [id number]  Chief Complaint:  HPI: *** Visit Diagnosis: No diagnosis found.  Past Psychiatric History: Please see initial evaluation for full details. I have reviewed the history. No updates at this time.     Past Medical History:  Past Medical History:  Diagnosis Date  . Glaucoma   . High cholesterol   . Hypertension   . Irritable bowel syndrome   . Osteoporosis   . Palpitations     Past Surgical History:  Procedure Laterality Date  . ABDOMINAL HYSTERECTOMY    . BREAST SURGERY    . CHOLECYSTECTOMY    . EYE SURGERY    . ORIF PATELLA Left 01/19/2016   Procedure: OPEN REDUCTION INTERNAL (ORIF) FIXATION LEFT PATELLA  FRACTURE;  Surgeon: Vickki Hearing, MD;  Location: [facility];  Service: Orthopedics;  Laterality: Left;    Family Psychiatric History: Please see initial evaluation for full details. I have reviewed the history. No updates at this time.     Family History:  Family History  Problem Relation Age of Onset  . High blood pressure Mother   . Stroke Father     Social History:  Social History   Socioeconomic History  . Marital status: Widowed    Spouse name: Not on file  . Number of children: Not on file  . Years of education: Not on file  . Highest education level: Not on file  Occupational History  . Not on file  Tobacco Use  . Smoking status: Never Smoker  . Smokeless tobacco: Never Used  Substance and Sexual Activity  . Alcohol use: No  . Drug use: No  . Sexual activity: Not Currently  Other Topics Concern  . Not on file  Social History Narrative  . Not on file   Social Determinants of Health   Financial Resource Strain:   . Difficulty of Paying Living Expenses:   Food Insecurity:   . Worried About Programme researcher, broadcasting/film/video in the Last Year:   . Barista in the Last Year:   Transportation Needs:   . Freight forwarder (Medical):   Marland Kitchen Lack of Transportation  (Non-Medical):   Physical Activity:   . Days of Exercise per Week:   . Minutes of Exercise per Session:   Stress:   . Feeling of Stress :   Social Connections:   . Frequency of Communication with Friends and Family:   . Frequency of Social Gatherings with Friends and Family:   . Attends Religious Services:   . Active Member of Clubs or Organizations:   . Attends Banker Meetings:   Marland Kitchen Marital Status:     Allergies:  Allergies  Allergen Reactions  . Penicillins     Unknown-childhood allergy Pt tolerated Rocephin IV on 04/11/2018 (monitored in the ED)    Metabolic Disorder Labs: No results found for: HGBA1C, MPG No results found for: PROLACTIN No results found for: CHOL, TRIG, HDL, CHOLHDL, VLDL, LDLCALC No results found for: TSH  Therapeutic Level Labs: No results found for: LITHIUM No results found for: VALPROATE No components found for:  CBMZ  Current Medications: Current Outpatient Medications  Medication Sig Dispense Refill  . atorvastatin (LIPITOR) 20 MG tablet Take 20 mg by mouth every morning.     . Calcium Carb-Cholecalciferol (CALCIUM 600 + D PO) Take 1 tablet by mouth daily.    . cefpodoxime (VANTIN) 200 MG tablet Take  1 tablet (200 mg total) by mouth 2 (two) times daily. 20 tablet 0  . clonazePAM (KLONOPIN) 1 MG tablet Take 1 mg daily and 0.5 mg at night 45 tablet 0  . dexlansoprazole (DEXILANT) 60 MG capsule Take 60 mg by mouth daily.    . diphenoxylate-atropine (LOMOTIL) 2.5-0.025 MG tablet Take 1 tablet by mouth 4 (four) times daily as needed for diarrhea or loose stools.    . docusate sodium (COLACE) 100 MG capsule Take 1 capsule (100 mg total) by mouth 2 (two) times daily as needed for mild constipation. 20 capsule 0  . fexofenadine (ALLEGRA) 180 MG tablet Take 180 mg by mouth daily.    . hydrochlorothiazide (HYDRODIURIL) 25 MG tablet Take 25 mg by mouth daily.    Marland Kitchen latanoprost (XALATAN) 0.005 % ophthalmic solution Place 1 drop into both eyes at  bedtime.    Marland Kitchen loperamide (IMODIUM) 2 MG capsule Take 2 mg by mouth as needed for diarrhea or loose stools.    . meclizine (ANTIVERT) 25 MG tablet Take 25 mg by mouth 3 (three) times daily.     . potassium chloride (K-DUR) 10 MEQ tablet Take 1 tablet (10 mEq total) by mouth 2 (two) times daily. 10 tablet 0  . promethazine (PHENERGAN) 25 MG tablet Take 25 mg by mouth every 6 (six) hours as needed for nausea or vomiting.    . propranolol (INDERAL) 60 MG tablet Take 60 mg by mouth 2 (two) times daily.    . timolol (BETIMOL) 0.5 % ophthalmic solution Place 1 drop into the right eye daily.    Marland Kitchen venlafaxine XR (EFFEXOR-XR) 150 MG 24 hr capsule Take 1 capsule (150 mg total) by mouth daily with breakfast. 90 capsule 1   No current facility-administered medications for this visit.     Musculoskeletal: Strength & Muscle Tone: N/A Gait & Station: N/A Patient leans: N/A  Psychiatric Specialty Exam: Review of Systems  There were no vitals taken for this visit.There is no height or weight on file to calculate BMI.  General Appearance: {Appearance:22683}  Eye Contact:  {[facility] EYE CONTACT:22684}  Speech:  Clear and Coherent  Volume:  Normal  Mood:  {B[facility] MOOD:22306}  Affect:  {Affect (PAA):22687}  Thought Process:  Coherent  Orientation:  Full (Time, Place, and Person)  Thought Content: Logical   Suicidal Thoughts:  {ST/HT (PAA):22692}  Homicidal Thoughts:  {ST/HT (PAA):22692}  Memory:  Immediate;   Good  Judgement:  {Judgement (PAA):22694}  Insight:  {Insight (PAA):22695}  Psychomotor Activity:  Normal  Concentration:  Concentration: Good and Attention Span: Good  Recall:  Good  Fund of Knowledge: Good  Language: Good  Akathisia:  No  Handed:  Right  AIMS (if indicated): not done  Assets:  Communication Skills Desire for Improvement  ADL's:  Intact  Cognition: WNL  Sleep:  {B[facility] GOOD/FAIR/POOR:22877}   Screenings:   Assessment and Plan:  Bonnie Rush is a 86 y.o. year old female  with a history of depression,neurocognitive disorder, hypertension,hyperlipidemia, irritable bowel syndrome, left patella fracture s/p ORIF , who presents for follow up appointment for No diagnosis found.  # MDD in partial remission, recurrent # Unspecified anxiety disorder   Although she reports occasional anxiety and depressive symptoms, it has been stable overall since the last visit. Will continue venlafaxine to target anxiety. Will continue clonazepam prn for anxiety. Noted that it has been discussed many times/strongly advised to avoid taking clonazepam given risk of falls and sedation, both the patient and her daughter  reports strong benefit from this medication. Will closely monitor any side effect.   # major neurocognitive disorder, mild   She does have neurocognitive deficits characterized by Pierre. No significant change in her cognitive function.  She could not tolerate Aricept due to side effect of visual hallucinations.  We will continue to monitor.   Plan  1. Continue venlafaxine 150 mg daily 2. Continue clonazepam 1 mg during the day and 0.5 mg at night  3.Next appointment:5/18 at 2:30 for 20 mins, phone (Aricept discontinued due to [facility])  The patient demonstrates the following risk factors for suicide: Chronic risk factorsfor suicide include psychiatric disorder, demographic factors ( >18 yo). Acute risk factorsfor suicide includenone.Protective factorsfor this patient include positive social support,hope for the future. Considering these factors, the overall suicide risk at this point appears to be low.  Norman Clay, MD 02/02/2020, 3:17 PM

## 2020-02-08 ENCOUNTER — Ambulatory Visit (HOSPITAL_COMMUNITY): Payer: Medicare Other | Admitting: Psychiatry

## 2020-02-08 NOTE — Progress Notes (Signed)
Virtual Visit via Telephone Note  I connected with Bonnie Rush on 02/14/20 at  1:40 PM EDT by telephone and verified that I am speaking with the correct person using two identifiers.   I discussed the limitations, risks, security and privacy concerns of performing an evaluation and management service by telephone and the availability of in person appointments. I also discussed with the patient that there may be a patient responsible charge related to this service. The patient expressed understanding and agreed to proceed.   I discussed the assessment and treatment plan with the patient. The patient was provided an opportunity to ask questions and all were answered. The patient agreed with the plan and demonstrated an understanding of the instructions.   The patient was advised to call back or seek an in-person evaluation if the symptoms worsen or if the condition fails to improve as anticipated.  I provided 11 minutes of non-face-to-face time during this encounter.  Location: patient- home, provider- office    [name], MD    [facility] MD/PA/NP OP Progress Note  02/14/2020 2:15 PM Bonnie Rush  MRN:  [id number]  Chief Complaint:  Chief Complaint    Depression; Follow-up; Anxiety     HPI:  This is a follow-up appointment for depression and anxiety.  The patient states that she has been having trouble with colon, referring to her diarrhea. She feels that "someday is good, someday is not." She complains of leg pain, which she attributes to osteoporosis.  She has not been able to go to bingo due to diarrhea. She feels lonely. She denies insomnia.  She feels depressed.  She has fair motivation and energy.  She has decreased appetite.  She denies SI.  She denies anxiety, although she asks to increase the dose of clonazepam. She takes medication regularly. She agrees to discuss about her weight loss with her PCP.  Bonnie Rush, the patient's daughter presents to the interview.  Bonnie Rush is having  severe diarrhea. Bonnie Rush is concerned of her age. Bonnie Rush appears to fee lonely. Bonnie Rush lives by herself. Bonnie Rush and her two brothers live nearby, and they visit the patient. No safety concern.   130 lbs,  Wt Readings from Last 3 Encounters:  11/02/18 153 lb (69.4 kg)  08/28/18 150 lb (68 kg)  07/27/18 150 lb (68 kg)    Visit Diagnosis:    ICD-10-CM   1. MDD (major depressive disorder), recurrent episode, mild (HCC)  F33.0   2. Generalized anxiety disorder  F41.1     Past Psychiatric History: Please see initial evaluation for full details. I have reviewed the history. No updates at this time.     Past Medical History:  Past Medical History:  Diagnosis Date  . Glaucoma   . High cholesterol   . Hypertension   . Irritable bowel syndrome   . Osteoporosis   . Palpitations     Past Surgical History:  Procedure Laterality Date  . ABDOMINAL HYSTERECTOMY    . BREAST SURGERY    . CHOLECYSTECTOMY    . EYE SURGERY    . ORIF PATELLA Left 01/19/2016   Procedure: OPEN REDUCTION INTERNAL (ORIF) FIXATION LEFT PATELLA  FRACTURE;  Surgeon: Vickki Hearing, MD;  Location: [facility];  Service: Orthopedics;  Laterality: Left;    Family Psychiatric History: Please see initial evaluation for full details. I have reviewed the history. No updates at this time.     Family History:  Family History  Problem Relation Age of Onset  .  High blood pressure Mother   . Stroke Father     Social History:  Social History   Socioeconomic History  . Marital status: Widowed    Spouse name: Not on file  . Number of children: Not on file  . Years of education: Not on file  . Highest education level: Not on file  Occupational History  . Not on file  Tobacco Use  . Smoking status: Never Smoker  . Smokeless tobacco: Never Used  Substance and Sexual Activity  . Alcohol use: No  . Drug use: No  . Sexual activity: Not Currently  Other Topics Concern  . Not on file  Social History Narrative  . Not  on file   Social Determinants of Health   Financial Resource Strain:   . Difficulty of Paying Living Expenses:   Food Insecurity:   . Worried About Charity fundraiser in the Last Year:   . Arboriculturist in the Last Year:   Transportation Needs:   . Film/video editor (Medical):   Marland Kitchen Lack of Transportation (Non-Medical):   Physical Activity:   . Days of Exercise per Week:   . Minutes of Exercise per Session:   Stress:   . Feeling of Stress :   Social Connections:   . Frequency of Communication with Friends and Family:   . Frequency of Social Gatherings with Friends and Family:   . Attends Religious Services:   . Active Member of Clubs or Organizations:   . Attends Archivist Meetings:   Marland Kitchen Marital Status:     Allergies:  Allergies  Allergen Reactions  . Penicillins     Unknown-childhood allergy Pt tolerated Rocephin IV on 04/11/2018 (monitored in the ED)    Metabolic Disorder Labs: No results found for: HGBA1C, MPG No results found for: PROLACTIN No results found for: CHOL, TRIG, HDL, CHOLHDL, VLDL, LDLCALC No results found for: TSH  Therapeutic Level Labs: No results found for: LITHIUM No results found for: VALPROATE No components found for:  CBMZ  Current Medications: Current Outpatient Medications  Medication Sig Dispense Refill  . atorvastatin (LIPITOR) 20 MG tablet Take 20 mg by mouth every morning.     . Calcium Carb-Cholecalciferol (CALCIUM 600 + D PO) Take 1 tablet by mouth daily.    . cefpodoxime (VANTIN) 200 MG tablet Take 1 tablet (200 mg total) by mouth 2 (two) times daily. 20 tablet 0  . [START ON 02/24/2020] clonazePAM (KLONOPIN) 1 MG tablet Take 1 mg daily and 0.5 mg at night 45 tablet 2  . dexlansoprazole (DEXILANT) 60 MG capsule Take 60 mg by mouth daily.    . diphenoxylate-atropine (LOMOTIL) 2.5-0.025 MG tablet Take 1 tablet by mouth 4 (four) times daily as needed for diarrhea or loose stools.    . docusate sodium (COLACE) 100 MG  capsule Take 1 capsule (100 mg total) by mouth 2 (two) times daily as needed for mild constipation. 20 capsule 0  . fexofenadine (ALLEGRA) 180 MG tablet Take 180 mg by mouth daily.    . hydrochlorothiazide (HYDRODIURIL) 25 MG tablet Take 25 mg by mouth daily.    Marland Kitchen latanoprost (XALATAN) 0.005 % ophthalmic solution Place 1 drop into both eyes at bedtime.    Marland Kitchen loperamide (IMODIUM) 2 MG capsule Take 2 mg by mouth as needed for diarrhea or loose stools.    . meclizine (ANTIVERT) 25 MG tablet Take 25 mg by mouth 3 (three) times daily.     . potassium  chloride (K-DUR) 10 MEQ tablet Take 1 tablet (10 mEq total) by mouth 2 (two) times daily. 10 tablet 0  . promethazine (PHENERGAN) 25 MG tablet Take 25 mg by mouth every 6 (six) hours as needed for nausea or vomiting.    . propranolol (INDERAL) 60 MG tablet Take 60 mg by mouth 2 (two) times daily.    . timolol (BETIMOL) 0.5 % ophthalmic solution Place 1 drop into the right eye daily.    Marland Kitchen venlafaxine XR (EFFEXOR-XR) 150 MG 24 hr capsule Take 1 capsule (150 mg total) by mouth daily with breakfast. 90 capsule 0   No current facility-administered medications for this visit.     Musculoskeletal: Strength & Muscle Tone: N/A Gait & Station: N/A Patient leans: N/A  Psychiatric Specialty Exam: Review of Systems  Psychiatric/Behavioral: Positive for dysphoric mood. Negative for agitation, behavioral problems, confusion, decreased concentration, hallucinations, self-injury, sleep disturbance and suicidal ideas. The patient is not nervous/anxious and is not hyperactive.   All other systems reviewed and are negative.   There were no vitals taken for this visit.There is no height or weight on file to calculate BMI.  General Appearance: NA  Eye Contact:  NA  Speech:  Clear and Coherent  Volume:  Normal  Mood:  Depressed  Affect:  NA  Thought Process:  Coherent  Orientation:  Full (Time, Place, and Person)  Thought Content: Logical   Suicidal Thoughts:  No   Homicidal Thoughts:  No  Memory:  Immediate;   Good  Judgement:  Good  Insight:  Fair  Psychomotor Activity:  Normal  Concentration:  Concentration: Good and Attention Span: Good  Recall:  Good  Fund of Knowledge: Good  Language: Good  Akathisia:  No  Handed:  Right  AIMS (if indicated): not done  Assets:  Communication Skills Desire for Improvement  ADL's:  Intact  Cognition: WNL  Sleep:  Good   Screenings:   Assessment and Plan:  RAPHAELLA LARKIN is a 86 y.o. year old female with a history of depression, neurocognitive disorder, hypertension,hyperlipidemia, irritable bowel syndrome, left patella fracture s/p ORIF , who presents for follow up appointment for below.   1. MDD (major depressive disorder), recurrent episode, mild (HCC) 2. Generalized anxiety disorder Although she Rush occasional anxiety and depressive symptoms, it has been overall stable since the last visit.  Will continue venlafaxine to target depression and anxiety.  Will continue clonazepam as needed for anxiety.  Noted that the patient and her daughter, Bonnie Rush significant benefit from clonazepam despite they are aware of it potential risk of fall and oversedation. Will continue to monitor any side effect.   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 No significant change in her cognitive function.  She could not tolerate Aricept due to side effect of visual hallucinations.  We will continue to monitor.   Plan I have reviewed and updated plans as below 1. Continue venlafaxine 150 mg daily 2. Continue clonazepam 1 mg during the day and 0.5 mg at night    3.Next appointment:8/16 at 1:20 for 20 mins, phone- Her Daughter's phone number- Bonnie Rush, [phone] (Aricept discontinued due to [facility])  The patient demonstrates the following risk factors for suicide: Chronic risk factorsfor suicide include psychiatric disorder, demographic factors (  >56 yo). Acute risk factorsfor suicide includenone.Protective factorsfor this patient include positive social support,hope for the future. Considering these factors, the overall suicide risk at this point appears to be low.  [name], MD 02/14/2020, 2:15 PM

## 2020-02-14 ENCOUNTER — Other Ambulatory Visit: Payer: Self-pay

## 2020-02-14 ENCOUNTER — Telehealth (INDEPENDENT_AMBULATORY_CARE_PROVIDER_SITE_OTHER): Payer: Medicare Other | Admitting: Psychiatry

## 2020-02-14 ENCOUNTER — Encounter (HOSPITAL_COMMUNITY): Payer: Self-pay | Admitting: Psychiatry

## 2020-02-14 DIAGNOSIS — F330 Major depressive disorder, recurrent, mild: Secondary | ICD-10-CM | POA: Diagnosis not present

## 2020-02-14 DIAGNOSIS — F411 Generalized anxiety disorder: Secondary | ICD-10-CM

## 2020-02-14 MED ORDER — VENLAFAXINE HCL ER 150 MG PO CP24: 150.0000 mg | ORAL_CAPSULE | Freq: Every day | ORAL | 0 refills | Status: DC

## 2020-02-14 MED ORDER — CLONAZEPAM 1 MG PO TABS: ORAL_TABLET | ORAL | 2 refills | Status: DC

## 2020-02-20 DIAGNOSIS — I10 Essential (primary) hypertension: Secondary | ICD-10-CM | POA: Diagnosis not present

## 2020-02-20 DIAGNOSIS — M159 Polyosteoarthritis, unspecified: Secondary | ICD-10-CM | POA: Diagnosis not present

## 2020-02-20 DIAGNOSIS — J449 Chronic obstructive pulmonary disease, unspecified: Secondary | ICD-10-CM | POA: Diagnosis not present

## 2020-03-01 ENCOUNTER — Emergency Department (HOSPITAL_COMMUNITY): Payer: Medicare Other

## 2020-03-01 ENCOUNTER — Encounter (HOSPITAL_COMMUNITY): Payer: Self-pay | Admitting: *Deleted

## 2020-03-01 ENCOUNTER — Emergency Department (HOSPITAL_COMMUNITY)
Admission: EM | Admit: 2020-03-01 | Discharge: 2020-03-01 | Disposition: A | Discharge status: Home / Self Care | Payer: Medicare Other | Attending: Emergency Medicine | Admitting: Emergency Medicine

## 2020-03-01 ENCOUNTER — Other Ambulatory Visit: Payer: Self-pay

## 2020-03-01 DIAGNOSIS — Z880 Allergy status to penicillin: Secondary | ICD-10-CM | POA: Diagnosis not present

## 2020-03-01 DIAGNOSIS — W19XXXA Unspecified fall, initial encounter: Secondary | ICD-10-CM | POA: Insufficient documentation

## 2020-03-01 DIAGNOSIS — S8992XA Unspecified injury of left lower leg, initial encounter: Secondary | ICD-10-CM | POA: Diagnosis not present

## 2020-03-01 DIAGNOSIS — M25561 Pain in right knee: Secondary | ICD-10-CM | POA: Diagnosis not present

## 2020-03-01 DIAGNOSIS — Y92009 Unspecified place in unspecified non-institutional (private) residence as the place of occurrence of the external cause: Secondary | ICD-10-CM

## 2020-03-01 DIAGNOSIS — M25562 Pain in left knee: Secondary | ICD-10-CM | POA: Insufficient documentation

## 2020-03-01 DIAGNOSIS — S8991XA Unspecified injury of right lower leg, initial encounter: Secondary | ICD-10-CM | POA: Diagnosis not present

## 2020-03-01 NOTE — ED Triage Notes (Signed)
Daughter reports that pt fell in the house two days ago, was fine when she went to bad, got up in the am with right knee pain.  Pt states that she lost her balance was the reason that she fell. Pt also had a fall two weeks ago as well.

## 2020-03-01 NOTE — ED Provider Notes (Signed)
[facility] EMERGENCY DEPARTMENT Provider Note   CSN: [id number] Arrival date & time: 03/01/20  1914     History Chief Complaint  Patient presents with  . Knee Pain    Bonnie Rush is a 86 y.o. female.  HPI      Bonnie Rush is a 86 y.o. female who presents to the Emergency Department complaining of bilateral knee pain secondary to a mechanical fall that occurred 2 days ago.  She complains of aching pain to both knees and pain from her right knee radiating into her right lower leg and foot.  She states the pain woke her from sleep last evening.  She took Tylenol earlier today with some relief.  She denies head injury, LOC, dizziness, swelling, numbness or weakness of her lower extremities.  Pain worsens with movement and weightbearing, improves at rest.    Past Medical History:  Diagnosis Date  . Glaucoma   . High cholesterol   . Hypertension   . Irritable bowel syndrome   . Osteoporosis   . Palpitations     Patient Active Problem List   Diagnosis Date Noted  . Chip fracture of triquetral bone of left wrist 07/07/18 08/27/2018  . Major neurocognitive disorder (HCC) 06/12/2017  . Moderate episode of recurrent major depressive disorder (HCC) 06/20/2016  . Closed fracture of left patella     Past Surgical History:  Procedure Laterality Date  . ABDOMINAL HYSTERECTOMY    . BREAST SURGERY    . CHOLECYSTECTOMY    . EYE SURGERY    . ORIF PATELLA Left 01/19/2016   Procedure: OPEN REDUCTION INTERNAL (ORIF) FIXATION LEFT PATELLA  FRACTURE;  Surgeon: Vickki Hearing, MD;  Location: [facility];  Service: Orthopedics;  Laterality: Left;     OB History   No obstetric history on file.     Family History  Problem Relation Age of Onset  . High blood pressure Mother   . Stroke Father     Social History   Tobacco Use  . Smoking status: Never Smoker  . Smokeless tobacco: Never Used  Substance Use Topics  . Alcohol use: No  . Drug use: No    Home  Medications Prior to Admission medications   Medication Sig Start Date End Date Taking? Authorizing Provider  alendronate (FOSAMAX) 70 MG tablet Take 70 mg by mouth once a week. Take with a full glass of water on an empty stomach.   Yes [provider]  atorvastatin (LIPITOR) 20 MG tablet Take 20 mg by mouth every morning.    Yes [provider]  Calcium Carb-Cholecalciferol (CALCIUM 600 + D PO) Take 1 tablet by mouth daily.   Yes [provider]  clonazePAM (KLONOPIN) 1 MG tablet Take 1 mg daily and 0.5 mg at night Patient taking differently: Take 0.5-1 mg by mouth See admin instructions. Take 1 mg daily and 0.5 mg at night 02/24/20  Yes Hisada, Reina, MD  dexlansoprazole (DEXILANT) 60 MG capsule Take 60 mg by mouth daily.   Yes [provider]  fexofenadine (ALLEGRA) 180 MG tablet Take 180 mg by mouth daily as needed for allergies.    Yes [provider]  hydrochlorothiazide (HYDRODIURIL) 25 MG tablet Take 25 mg by mouth daily.   Yes [provider]  latanoprost (XALATAN) 0.005 % ophthalmic solution Place 1 drop into both eyes at bedtime.   Yes [provider]  loperamide (IMODIUM) 2 MG capsule Take 2 mg by mouth as needed for diarrhea  or loose stools.   Yes [provider]  meclizine (ANTIVERT) 25 MG tablet Take 25 mg by mouth 3 (three) times daily as needed for dizziness.    Yes [provider]  MYRBETRIQ 50 MG TB24 tablet Take 50 mg by mouth daily. 02/28/20  Yes [provider]  potassium chloride (K-DUR) 10 MEQ tablet Take 1 tablet (10 mEq total) by mouth 2 (two) times daily. 04/11/18  Yes Linwood Dibbles, MD  promethazine (PHENERGAN) 25 MG tablet Take 25 mg by mouth every 6 (six) hours as needed for nausea or vomiting.   Yes [provider]  propranolol (INDERAL) 60 MG tablet Take 60 mg by mouth 2 (two) times daily.   Yes [provider]  SYMBICORT 160-4.5 MCG/ACT inhaler Inhale 2 puffs into the  lungs 2 (two) times daily. 02/28/20  Yes [provider]  timolol (BETIMOL) 0.5 % ophthalmic solution Place 1 drop into the right eye daily.   Yes [provider]  venlafaxine XR (EFFEXOR-XR) 150 MG 24 hr capsule Take 1 capsule (150 mg total) by mouth daily with breakfast. 02/14/20  Yes Hisada, Barbee Cough, MD    Allergies    Penicillins  Review of Systems   Review of Systems  Constitutional: Negative for chills and fever.  Respiratory: Negative for shortness of breath.   Cardiovascular: Negative for chest pain.  Gastrointestinal: Negative for abdominal pain, nausea and vomiting.  Genitourinary: Negative for difficulty urinating and dysuria.  Musculoskeletal: Positive for arthralgias (Bilateral knee pain). Negative for back pain, joint swelling and neck pain.  Skin: Negative for color change and wound.  Neurological: Negative for dizziness, seizures, weakness and headaches.    Physical Exam Updated Vital Signs BP (!) 164/73 (BP Location: Right Arm)   Pulse 72   Temp 98.9 F (37.2 C) (Oral)   Resp 18   Ht 5\' 3"  (1.6 m)   Wt 61.2 kg   SpO2 92%   BMI 23.91 kg/m   Physical Exam Vitals and nursing note reviewed.  Constitutional:      General: She is not in acute distress.    Appearance: Normal appearance.  HENT:     Head: Atraumatic.  Cardiovascular:     Rate and Rhythm: Normal rate and regular rhythm.     Pulses: Normal pulses.  Pulmonary:     Effort: Pulmonary effort is normal.     Breath sounds: Normal breath sounds.  Chest:     Chest wall: No tenderness.  Musculoskeletal:        General: Tenderness and signs of injury present. No swelling or deformity.     Cervical back: Normal range of motion.     Comments: Tenderness to palpation of the anterior right knee.  Small area of ecchymosis present.  No palpable effusion or edema of the joint.  No erythema or excessive warmth.  Mild tenderness with valgus and varus stress.  Left knee is nontender through range of  motion.  No tenderness with valgus or varus stress.  No erythema.  Skin:    General: Skin is warm.     Capillary Refill: Capillary refill takes less than 2 seconds.     Findings: No erythema or rash.  Neurological:     General: No focal deficit present.     Mental Status: She is alert.     ED Results / Procedures / Treatments   Labs (all labs ordered are listed, but only abnormal results are displayed) Labs Reviewed - No data to display  EKG None  Radiology DG Knee Complete 4 Views Left  Result Date: 03/01/2020 CLINICAL DATA:  Bilateral knee pain. Fall 2 weeks ago, fall 2 days ago. EXAM: LEFT KNEE - COMPLETE 4+ VIEW COMPARISON:  Radiograph 04/08/2016 FINDINGS: K-wire fixation of the patella. No acute fracture. Mild tricompartmental peripheral spurring. Medial tibiofemoral joint space narrowing. There is chondrocalcinosis. No significant joint effusion. The bones are diffusely under mineralized. No focal soft tissue abnormality. IMPRESSION: 1. No acute fracture or dislocation of the left knee. 2. Mild tricompartmental osteoarthritis. Chondrocalcinosis. Electronically Signed   By: Keith Rake M.D.   On: 03/01/2020 21:31   DG Knee Complete 4 Views Right  Result Date: 03/01/2020 CLINICAL DATA:  Bilateral knee pain. Fall 2 weeks ago and again 2 days ago. EXAM: RIGHT KNEE - COMPLETE 4+ VIEW COMPARISON:  None. FINDINGS: No fracture or dislocation. Tricompartmental osteoarthritis with peripheral spurring. No significant joint effusion. There is a small quadriceps tendon enthesophyte. The bones are under mineralized. Faint chondrocalcinosis. IMPRESSION: 1. No fracture or dislocation of the right knee. 2. Mild tricompartmental osteoarthritis. Small quadriceps tendon enthesophyte. Electronically Signed   By: Keith Rake M.D.   On: 03/01/2020 21:30    Procedures Procedures (including critical care time)  Medications Ordered in ED Medications - No data to display  ED Course  I have  reviewed the triage vital signs and the nursing notes.  Pertinent labs & imaging results that were available during my care of the patient were reviewed by me and considered in my medical decision making (see chart for details).    MDM Rules/Calculators/A&P                      Patient here for evaluation of bilateral knee pain secondary to a mechanical fall that occurred 2 days ago.  Right knee pain greater than left.  X-rays are negative for acute bony injury.  She remains neurovascularly intact.  No concerning symptoms for septic joint.  Velcro knee brace applied for support and comfort.  Patient agrees to close follow-up with her orthopedic provider.   Final Clinical Impression(s) / ED Diagnoses Final diagnoses:  Acute pain of both knees  Fall in home, initial encounter    Rx / DC Orders ED Discharge Orders    None       Bufford Lope 03/01/20 2236    Elnora Morrison, MD 03/01/20 2352

## 2020-03-01 NOTE — ED Notes (Signed)
Patient transported to X-ray 

## 2020-03-01 NOTE — Discharge Instructions (Addendum)
The x-rays of your knees show arthritis, but no broken bones or swelling to the joints.  You may wear the knee brace as needed for support when walking or standing.  Do not wear continuously or at bedtime.  Tylenol every 4 hours if needed for pain.  Call Dr. Mort Sawyers office this week to arrange a follow-up appointment.

## 2020-03-22 DIAGNOSIS — M159 Polyosteoarthritis, unspecified: Secondary | ICD-10-CM | POA: Diagnosis not present

## 2020-03-22 DIAGNOSIS — J449 Chronic obstructive pulmonary disease, unspecified: Secondary | ICD-10-CM | POA: Diagnosis not present

## 2020-03-22 DIAGNOSIS — I10 Essential (primary) hypertension: Secondary | ICD-10-CM | POA: Diagnosis not present

## 2020-04-21 DIAGNOSIS — J449 Chronic obstructive pulmonary disease, unspecified: Secondary | ICD-10-CM | POA: Diagnosis not present

## 2020-04-21 DIAGNOSIS — I10 Essential (primary) hypertension: Secondary | ICD-10-CM | POA: Diagnosis not present

## 2020-04-21 DIAGNOSIS — M159 Polyosteoarthritis, unspecified: Secondary | ICD-10-CM | POA: Diagnosis not present

## 2020-05-03 NOTE — Progress Notes (Deleted)
[facility] MD/PA/NP OP Progress Note  05/03/2020 10:09 AM Bonnie Rush  MRN:  [id number]  Chief Complaint:  HPI: *** Visit Diagnosis: No diagnosis found.  Past Psychiatric History: Please see initial evaluation for full details. I have reviewed the history. No updates at this time.     Past Medical History:  Past Medical History:  Diagnosis Date   Glaucoma    High cholesterol    Hypertension    Irritable bowel syndrome    Osteoporosis    Palpitations     Past Surgical History:  Procedure Laterality Date   ABDOMINAL HYSTERECTOMY     BREAST SURGERY     CHOLECYSTECTOMY     EYE SURGERY     ORIF PATELLA Left 01/19/2016   Procedure: OPEN REDUCTION INTERNAL (ORIF) FIXATION LEFT PATELLA  FRACTURE;  Surgeon: Vickki Hearing, MD;  Location: [facility];  Service: Orthopedics;  Laterality: Left;    Family Psychiatric History: Please see initial evaluation for full details. I have reviewed the history. No updates at this time.     Family History:  Family History  Problem Relation Age of Onset   High blood pressure Mother    Stroke Father     Social History:  Social History   Socioeconomic History   Marital status: Widowed    Spouse name: Not on file   Number of children: Not on file   Years of education: Not on file   Highest education level: Not on file  Occupational History   Not on file  Tobacco Use   Smoking status: Never Smoker   Smokeless tobacco: Never Used  Substance and Sexual Activity   Alcohol use: No   Drug use: No   Sexual activity: Not Currently  Other Topics Concern   Not on file  Social History Narrative   Not on file   Social Determinants of Health   Financial Resource Strain:    Difficulty of Paying Living Expenses:   Food Insecurity:    Worried About Running Out of Food in the Last Year:    Barista in the Last Year:   Transportation Needs:    Freight forwarder (Medical):    Lack of Transportation  (Non-Medical):   Physical Activity:    Days of Exercise per Week:    Minutes of Exercise per Session:   Stress:    Feeling of Stress :   Social Connections:    Frequency of Communication with Friends and Family:    Frequency of Social Gatherings with Friends and Family:    Attends Religious Services:    Active Member of Clubs or Organizations:    Attends Banker Meetings:    Marital Status:     Allergies:  Allergies  Allergen Reactions   Penicillins     Unknown-childhood allergy Pt tolerated Rocephin IV on 04/11/2018 (monitored in the ED)    Metabolic Disorder Labs: No results found for: HGBA1C, MPG No results found for: PROLACTIN No results found for: CHOL, TRIG, HDL, CHOLHDL, VLDL, LDLCALC No results found for: TSH  Therapeutic Level Labs: No results found for: LITHIUM No results found for: VALPROATE No components found for:  CBMZ  Current Medications: Current Outpatient Medications  Medication Sig Dispense Refill   alendronate (FOSAMAX) 70 MG tablet Take 70 mg by mouth once a week. Take with a full glass of water on an empty stomach.     atorvastatin (LIPITOR) 20 MG tablet Take 20 mg by mouth every morning.  Calcium Carb-Cholecalciferol (CALCIUM 600 + D PO) Take 1 tablet by mouth daily.     clonazePAM (KLONOPIN) 1 MG tablet Take 1 mg daily and 0.5 mg at night (Patient taking differently: Take 0.5-1 mg by mouth See admin instructions. Take 1 mg daily and 0.5 mg at night) 45 tablet 2   dexlansoprazole (DEXILANT) 60 MG capsule Take 60 mg by mouth daily.     fexofenadine (ALLEGRA) 180 MG tablet Take 180 mg by mouth daily as needed for allergies.      hydrochlorothiazide (HYDRODIURIL) 25 MG tablet Take 25 mg by mouth daily.     latanoprost (XALATAN) 0.005 % ophthalmic solution Place 1 drop into both eyes at bedtime.     loperamide (IMODIUM) 2 MG capsule Take 2 mg by mouth as needed for diarrhea or loose stools.     meclizine (ANTIVERT)  25 MG tablet Take 25 mg by mouth 3 (three) times daily as needed for dizziness.      MYRBETRIQ 50 MG TB24 tablet Take 50 mg by mouth daily.     potassium chloride (K-DUR) 10 MEQ tablet Take 1 tablet (10 mEq total) by mouth 2 (two) times daily. 10 tablet 0   promethazine (PHENERGAN) 25 MG tablet Take 25 mg by mouth every 6 (six) hours as needed for nausea or vomiting.     propranolol (INDERAL) 60 MG tablet Take 60 mg by mouth 2 (two) times daily.     SYMBICORT 160-4.5 MCG/ACT inhaler Inhale 2 puffs into the lungs 2 (two) times daily.     timolol (BETIMOL) 0.5 % ophthalmic solution Place 1 drop into the right eye daily.     venlafaxine XR (EFFEXOR-XR) 150 MG 24 hr capsule Take 1 capsule (150 mg total) by mouth daily with breakfast. 90 capsule 0   No current facility-administered medications for this visit.     Musculoskeletal: Strength & Muscle Tone: N?A Gait & Station: N/A Patient leans: N/A  Psychiatric Specialty Exam: Review of Systems  There were no vitals taken for this visit.There is no height or weight on file to calculate BMI.  General Appearance: {Appearance:22683}  Eye Contact:  {B[facility] EYE CONTACT:22684}  Speech:  Clear and Coherent  Volume:  Normal  Mood:  {B[facility] MOOD:22306}  Affect:  {Affect (PAA):22687}  Thought Process:  Coherent  Orientation:  Full (Time, Place, and Person)  Thought Content: Logical   Suicidal Thoughts:  {ST/HT (PAA):22692}  Homicidal Thoughts:  {ST/HT (PAA):22692}  Memory:  Immediate;   Good  Judgement:  {Judgement (PAA):22694}  Insight:  {Insight (PAA):22695}  Psychomotor Activity:  Normal  Concentration:  Concentration: Good and Attention Span: Good  Recall:  Good  Fund of Knowledge: Good  Language: Good  Akathisia:  No  Handed:  Right  AIMS (if indicated): not done  Assets:  Communication Skills Desire for Improvement  ADL's:  Intact  Cognition: WNL  Sleep:  {B[facility] GOOD/FAIR/POOR:22877}   Screenings:   Assessment and Plan:  Bonnie Rush is a 86 y.o. year old female with a history of  depression, neurocognitive disorder, hypertension,hyperlipidemia, irritable bowel syndrome, left patella fracture s/p ORIF , who presents for follow up appointment for below.    1. MDD (major depressive disorder), recurrent episode, mild (HCC) 2. Generalized anxiety disorder Although she reports occasional anxiety and depressive symptoms, it has been overall stable since the last visit.  Will continue venlafaxine to target depression and anxiety.  Will continue clonazepam as needed for anxiety.  Noted that the patient and her  daughter, Efraim Kaufmann reports significant benefit from clonazepam despite they are aware of it potential risk of fall and oversedation. Will continue to monitor any side effect.   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 No significant change in her cognitive function.She could not tolerate Aricept due to side effect of visual hallucinations.We will continue to monitor.  Plan  1. Continue venlafaxine 150 mg daily 2. Continue clonazepam 1 mg during the day and 0.5 mg at night    3.Next appointment:8/16 at 1:20 for 20 mins, phone- Her Daughter's phone number- Bonnie Rush, [phone] (Aricept discontinued due to [facility])  The patient demonstrates the following risk factors for suicide: Chronic risk factorsfor suicide include psychiatric disorder, demographic factors ( >56 yo). Acute risk factorsfor suicide includenone.Protective factorsfor this patient include positive social support,hope for the future. Considering these factors, the overall suicide risk at this point appears to be low.  [name], MD 05/03/2020, 10:09 AM

## 2020-05-05 DIAGNOSIS — J449 Chronic obstructive pulmonary disease, unspecified: Secondary | ICD-10-CM | POA: Diagnosis not present

## 2020-05-05 DIAGNOSIS — I10 Essential (primary) hypertension: Secondary | ICD-10-CM | POA: Diagnosis not present

## 2020-05-05 DIAGNOSIS — M159 Polyosteoarthritis, unspecified: Secondary | ICD-10-CM | POA: Diagnosis not present

## 2020-05-08 ENCOUNTER — Telehealth (HOSPITAL_COMMUNITY): Payer: Medicare Other | Admitting: Psychiatry

## 2020-05-16 ENCOUNTER — Telehealth (HOSPITAL_COMMUNITY): Payer: Self-pay | Admitting: *Deleted

## 2020-05-16 NOTE — Telephone Encounter (Signed)
Opened in Error.

## 2020-05-17 NOTE — Progress Notes (Signed)
Virtual Visit via Telephone Note  I connected with Bonnie Rush on 05/23/20 at  3:00 PM EDT by telephone and verified that I am speaking with the correct person using two identifiers.   I discussed the limitations, risks, security and privacy concerns of performing an evaluation and management service by telephone and the availability of in person appointments. I also discussed with the patient that there may be a patient responsible charge related to this service. The patient expressed understanding and agreed to proceed.    I discussed the assessment and treatment plan with the patient. The patient was provided an opportunity to ask questions and all were answered. The patient agreed with the plan and demonstrated an understanding of the instructions.   The patient was advised to call back or seek an in-person evaluation if the symptoms worsen or if the condition fails to improve as anticipated.  Location: patient- home, provider- home office   I provided 12 minutes of non-face-to-face time during this encounter.   [name], MD    [facility] MD/PA/NP OP Progress Note  05/23/2020 3:22 PM Bonnie Rush  MRN:  [id number]  Chief Complaint:  Chief Complaint    Depression; Follow-up; Anxiety     HPI:  This is a follow-up appointment for depression and anxiety.  She states that she has been doing well.  She asks nerve pill to be filled (clonazepam) while her anxiety has been good.  She spends most of the time watching TV.  She has not been able to go outside due to symptoms, which she attributes to IBS.  She quit going to church as well.  She also reports decreased vision, and has an upcoming appointment with a doctor.  She sleeps well.  She denies feeling depressed.  She has occasional mild anhedonia.  She has fair concentration.  She has good appetite.  Although she reports passive SI, she adamantly denies any plan or intent, stating that she children and grandchildren.   Bonnie Rush, her son  presents to the interview.  She has been doing well. She takes good care of her own medication. She cannot go outside due to GI symptoms and increased urinary frequency. Although she did have a fall the other day, he does not think it was related to medication. He verbalized  understanding regarding the possible side effect from clonazepam.  He denies any concern otherwise.   Daily routine: watch tv, stays inside the house Support: children Household: her daughter,, two sons live nearby Marital status: window. Married 3 times  Number of children: 3  Visit Diagnosis:    ICD-10-CM   1. MDD (major depressive disorder), recurrent, in partial remission (HCC)  F33.41   2. Generalized anxiety disorder  F41.1     Past Psychiatric History: Please see initial evaluation for full details. I have reviewed the history. No updates at this time.     Past Medical History:  Past Medical History:  Diagnosis Date  . Glaucoma   . High cholesterol   . Hypertension   . Irritable bowel syndrome   . Osteoporosis   . Palpitations     Past Surgical History:  Procedure Laterality Date  . ABDOMINAL HYSTERECTOMY    . BREAST SURGERY    . CHOLECYSTECTOMY    . EYE SURGERY    . ORIF PATELLA Left 01/19/2016   Procedure: OPEN REDUCTION INTERNAL (ORIF) FIXATION LEFT PATELLA  FRACTURE;  Surgeon: Vickki Hearing, MD;  Location: [facility];  Service: Orthopedics;  Laterality:  Left;    Family Psychiatric History: Please see initial evaluation for full details. I have reviewed the history. No updates at this time.     Family History:  Family History  Problem Relation Age of Onset  . High blood pressure Mother   . Stroke Father     Social History:  Social History   Socioeconomic History  . Marital status: Widowed    Spouse name: Not on file  . Number of children: Not on file  . Years of education: Not on file  . Highest education level: Not on file  Occupational History  . Not on file  Tobacco Use   . Smoking status: Never Smoker  . Smokeless tobacco: Never Used  Substance and Sexual Activity  . Alcohol use: No  . Drug use: No  . Sexual activity: Not Currently  Other Topics Concern  . Not on file  Social History Narrative  . Not on file   Social Determinants of Health   Financial Resource Strain:   . Difficulty of Paying Living Expenses: Not on file  Food Insecurity:   . Worried About Programme researcher, broadcasting/film/video in the Last Year: Not on file  . Ran Out of Food in the Last Year: Not on file  Transportation Needs:   . Lack of Transportation (Medical): Not on file  . Lack of Transportation (Non-Medical): Not on file  Physical Activity:   . Days of Exercise per Week: Not on file  . Minutes of Exercise per Session: Not on file  Stress:   . Feeling of Stress : Not on file  Social Connections:   . Frequency of Communication with Friends and Family: Not on file  . Frequency of Social Gatherings with Friends and Family: Not on file  . Attends Religious Services: Not on file  . Active Member of Clubs or Organizations: Not on file  . Attends Banker Meetings: Not on file  . Marital Status: Not on file    Allergies:  Allergies  Allergen Reactions  . Penicillins     Unknown-childhood allergy Pt tolerated Rocephin IV on 04/11/2018 (monitored in the ED)    Metabolic Disorder Labs: No results found for: HGBA1C, MPG No results found for: PROLACTIN No results found for: CHOL, TRIG, HDL, CHOLHDL, VLDL, LDLCALC No results found for: TSH  Therapeutic Level Labs: No results found for: LITHIUM No results found for: VALPROATE No components found for:  CBMZ  Current Medications: Current Outpatient Medications  Medication Sig Dispense Refill  . alendronate (FOSAMAX) 70 MG tablet Take 70 mg by mouth once a week. Take with a full glass of water on an empty stomach.    Marland Kitchen atorvastatin (LIPITOR) 20 MG tablet Take 20 mg by mouth every morning.     . Calcium Carb-Cholecalciferol  (CALCIUM 600 + D PO) Take 1 tablet by mouth daily.    . clonazePAM (KLONOPIN) 1 MG tablet Take 1 mg daily and 0.5 mg at night 45 tablet 2  . dexlansoprazole (DEXILANT) 60 MG capsule Take 60 mg by mouth daily.    . fexofenadine (ALLEGRA) 180 MG tablet Take 180 mg by mouth daily as needed for allergies.     . hydrochlorothiazide (HYDRODIURIL) 25 MG tablet Take 25 mg by mouth daily.    Marland Kitchen latanoprost (XALATAN) 0.005 % ophthalmic solution Place 1 drop into both eyes at bedtime.    Marland Kitchen loperamide (IMODIUM) 2 MG capsule Take 2 mg by mouth as needed for diarrhea or loose stools.    Marland Kitchen  meclizine (ANTIVERT) 25 MG tablet Take 25 mg by mouth 3 (three) times daily as needed for dizziness.     Marland Kitchen MYRBETRIQ 50 MG TB24 tablet Take 50 mg by mouth daily.    . potassium chloride (K-DUR) 10 MEQ tablet Take 1 tablet (10 mEq total) by mouth 2 (two) times daily. 10 tablet 0  . promethazine (PHENERGAN) 25 MG tablet Take 25 mg by mouth every 6 (six) hours as needed for nausea or vomiting.    . propranolol (INDERAL) 60 MG tablet Take 60 mg by mouth 2 (two) times daily.    . SYMBICORT 160-4.5 MCG/ACT inhaler Inhale 2 puffs into the lungs 2 (two) times daily.    . timolol (BETIMOL) 0.5 % ophthalmic solution Place 1 drop into the right eye daily.    Marland Kitchen venlafaxine XR (EFFEXOR-XR) 150 MG 24 hr capsule Take 1 capsule (150 mg total) by mouth daily with breakfast. 90 capsule 0   No current facility-administered medications for this visit.     Musculoskeletal: Strength & Muscle Tone: N/A Gait & Station: N/A Patient leans: N/A  Psychiatric Specialty Exam: Review of Systems  Psychiatric/Behavioral: Positive for suicidal ideas. Negative for agitation, behavioral problems, confusion, decreased concentration, dysphoric mood, hallucinations, self-injury and sleep disturbance. The patient is nervous/anxious. The patient is not hyperactive.   All other systems reviewed and are negative.   There were no vitals taken for this  visit.There is no height or weight on file to calculate BMI.  General Appearance: NA  Eye Contact:  NA  Speech:  Clear and Coherent  Volume:  Normal  Mood:  good  Affect:  NA  Thought Process:  Coherent  Orientation:  Full (Time, Place, and Person)  Thought Content: Logical   Suicidal Thoughts:  Yes.  without intent/plan  Homicidal Thoughts:  No  Memory:  Immediate;   Good  Judgement:  Good  Insight:  Fair  Psychomotor Activity:  Normal  Concentration:  Concentration: Good and Attention Span: Good  Recall:  Good  Fund of Knowledge: Good  Language: Good  Akathisia:  No  Handed:  Right  AIMS (if indicated): not done  Assets:  Communication Skills Desire for Improvement  ADL's:  Intact  Cognition: WNL  Sleep:  Good   Screenings:   Assessment and Plan:  Bonnie Rush is a 86 y.o. year old female with a history of depression, neurocognitive disorder, hypertension,hyperlipidemia, irritable bowel syndrome, left patella fracture s/p ORIF, who presents for follow up appointment for below.    1. MDD (major depressive disorder), recurrent, in partial remission (HCC) 2. Generalized anxiety disorder Although she reports occasional depressive symptoms and anxiety, it has been stable over several months.  Psychosocial stressors includes demoralization due to medical condition of IBS and decreased vision.  Will continue venlafaxine to target depression and anxiety.  Will continue clonazepam as needed for anxiety.  Noted that it is preferable to taper off clonazepam especially given her age/risk of fall, both the patient and her family members has strong preference to stay on the current dose.    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 No significant change in her cognitive function.She could not tolerate Aricept due to side effect of visual hallucinations.We will continue to monitor.  Plan I have reviewed and updated plans  as below 1. Continue venlafaxine 150 mg daily 2. Continue clonazepam 1 mg during the day and 0.5 mg at night    3.Next appointment: 11/23 at 1:20  for 20 mins, phone - Her Daughter's phone number- Melissa, [phone] ([facility]  Past trials of medication: Venlafaxine, clonazepam, Xanax (AH, VH, paranoia), Trazodone (palpitation)  I have reviewed suicide assessment in detail. No change in the following assessment.   The patient demonstrates the following risk factors for suicide: Chronic risk factorsfor suicide include psychiatric disorder, demographic factors 18( >60 yo). Acute risk factorsfor suicide includenone.Protective factorsfor this patient include positive social support,hope for the future. Considering these factors, the overall suicide risk at this point appears to be low.  Neysa Hottereina Fernandez Kenley, MD 05/23/2020, 3:22 PM

## 2020-05-23 ENCOUNTER — Other Ambulatory Visit: Payer: Self-pay

## 2020-05-23 ENCOUNTER — Telehealth (INDEPENDENT_AMBULATORY_CARE_PROVIDER_SITE_OTHER): Payer: Medicare Other | Admitting: Psychiatry

## 2020-05-23 ENCOUNTER — Encounter (HOSPITAL_COMMUNITY): Payer: Self-pay | Admitting: Psychiatry

## 2020-05-23 DIAGNOSIS — F411 Generalized anxiety disorder: Secondary | ICD-10-CM

## 2020-05-23 DIAGNOSIS — F3341 Major depressive disorder, recurrent, in partial remission: Secondary | ICD-10-CM | POA: Diagnosis not present

## 2020-05-23 MED ORDER — CLONAZEPAM 1 MG PO TABS: ORAL_TABLET | ORAL | 2 refills | Status: DC

## 2020-05-23 MED ORDER — VENLAFAXINE HCL ER 150 MG PO CP24: 150.0000 mg | ORAL_CAPSULE | Freq: Every day | ORAL | 0 refills | Status: DC

## 2020-06-22 DIAGNOSIS — J449 Chronic obstructive pulmonary disease, unspecified: Secondary | ICD-10-CM | POA: Diagnosis not present

## 2020-06-22 DIAGNOSIS — I10 Essential (primary) hypertension: Secondary | ICD-10-CM | POA: Diagnosis not present

## 2020-06-22 DIAGNOSIS — M159 Polyosteoarthritis, unspecified: Secondary | ICD-10-CM | POA: Diagnosis not present

## 2020-07-21 DIAGNOSIS — I10 Essential (primary) hypertension: Secondary | ICD-10-CM | POA: Diagnosis not present

## 2020-07-21 DIAGNOSIS — M159 Polyosteoarthritis, unspecified: Secondary | ICD-10-CM | POA: Diagnosis not present

## 2020-07-21 DIAGNOSIS — J449 Chronic obstructive pulmonary disease, unspecified: Secondary | ICD-10-CM | POA: Diagnosis not present

## 2020-08-01 DIAGNOSIS — Z1331 Encounter for screening for depression: Secondary | ICD-10-CM | POA: Diagnosis not present

## 2020-08-01 DIAGNOSIS — R5383 Other fatigue: Secondary | ICD-10-CM | POA: Diagnosis not present

## 2020-08-01 DIAGNOSIS — Z299 Encounter for prophylactic measures, unspecified: Secondary | ICD-10-CM | POA: Diagnosis not present

## 2020-08-01 DIAGNOSIS — E7800 Pure hypercholesterolemia, unspecified: Secondary | ICD-10-CM | POA: Diagnosis not present

## 2020-08-01 DIAGNOSIS — I10 Essential (primary) hypertension: Secondary | ICD-10-CM | POA: Diagnosis not present

## 2020-08-01 DIAGNOSIS — Z1339 Encounter for screening examination for other mental health and behavioral disorders: Secondary | ICD-10-CM | POA: Diagnosis not present

## 2020-08-01 DIAGNOSIS — Z0000 Encounter for general adult medical examination without abnormal findings: Secondary | ICD-10-CM | POA: Diagnosis not present

## 2020-08-01 DIAGNOSIS — Z7189 Other specified counseling: Secondary | ICD-10-CM | POA: Diagnosis not present

## 2020-08-01 DIAGNOSIS — E559 Vitamin D deficiency, unspecified: Secondary | ICD-10-CM | POA: Diagnosis not present

## 2020-08-01 DIAGNOSIS — Z79899 Other long term (current) drug therapy: Secondary | ICD-10-CM | POA: Diagnosis not present

## 2020-08-01 DIAGNOSIS — Z6829 Body mass index (BMI) 29.0-29.9, adult: Secondary | ICD-10-CM | POA: Diagnosis not present

## 2020-08-01 DIAGNOSIS — Z23 Encounter for immunization: Secondary | ICD-10-CM | POA: Diagnosis not present

## 2020-08-02 DIAGNOSIS — R350 Frequency of micturition: Secondary | ICD-10-CM | POA: Diagnosis not present

## 2020-08-08 NOTE — Progress Notes (Addendum)
Virtual Visit via Video Note  I connected with Bonnie Rush on 08/15/20 at  1:20 PM EST by a video enabled telemedicine application and verified that I am speaking with the correct person using two identifiers.  Location: Patient: home Provider: office Persons participated in the visit- patient, provider, Bonnie Rush to help her  I discussed the limitations of evaluation and management by telemedicine and the availability of in person appointments. The patient expressed understanding and agreed to proceed.    I discussed the assessment and treatment plan with the patient. The patient was provided an opportunity to ask questions and all were answered. The patient agreed with the plan and demonstrated an understanding of the instructions.   The patient was advised to call back or seek an in-person evaluation if the symptoms worsen or if the condition fails to improve as anticipated.  I provided 11 minutes of non-face-to-face time during this encounter.   [name], MD    [facility] MD/PA/NP OP Progress Note  08/15/2020 1:47 PM Bonnie Rush  MRN:  [id number]  Chief Complaint:  Chief Complaint    Follow-up; Depression     HPI:  This is a follow-up appointment for depression and anxiety.  She states that she has been doing the same.  She states that UTI has been cleared, and she has been doing well.  She still struggles with diarrhea, which she attributes to IBS.  She has not been able to go outside due to the symptoms.  Although she feels frustrated about the condition, and misses her family who passed away, she reports great support from her family.  She enjoys interacting with her grandchildren.  She also states that she has 2 great grandchildren, who she will meet on Thanksgiving.  She sleeps well.  She feels down at times.  She has good appetite.  She has fair concentration.  She denies SI.  She denies anxiety or panic attacks.   Bonnie Rush, her son presents to the interview.  He agrees  that she has been doing the same since the last visit.  She has not had any falls.  He denies any concerns at this time.    Daily routine: watch tv, stays inside the house Support: children Household: her daughter, two sons live nearby Marital status: window. Married 3 times  Number of children: 3   Visit Diagnosis:    ICD-10-CM   1. MDD (major depressive disorder), recurrent, in partial remission (HCC)  F33.41   2. Generalized anxiety disorder  F41.1     Past Psychiatric History: Please see initial evaluation for full details. I have reviewed the history. No updates at this time.     Past Medical History:  Past Medical History:  Diagnosis Date  . Glaucoma   . High cholesterol   . Hypertension   . Irritable bowel syndrome   . Osteoporosis   . Palpitations     Past Surgical History:  Procedure Laterality Date  . ABDOMINAL HYSTERECTOMY    . BREAST SURGERY    . CHOLECYSTECTOMY    . EYE SURGERY    . ORIF PATELLA Left 01/19/2016   Procedure: OPEN REDUCTION INTERNAL (ORIF) FIXATION LEFT PATELLA  FRACTURE;  Surgeon: Vickki Hearing, MD;  Location: [facility];  Service: Orthopedics;  Laterality: Left;    Family Psychiatric History: Please see initial evaluation for full details. I have reviewed the history. No updates at this time.     Family History:  Family History  Problem Relation Age  of Onset  . High blood pressure Mother   . Stroke Father     Social History:  Social History   Socioeconomic History  . Marital status: Widowed    Spouse name: Not on file  . Number of children: Not on file  . Years of education: Not on file  . Highest education level: Not on file  Occupational History  . Not on file  Tobacco Use  . Smoking status: Never Smoker  . Smokeless tobacco: Never Used  Substance and Sexual Activity  . Alcohol use: No  . Drug use: No  . Sexual activity: Not Currently  Other Topics Concern  . Not on file  Social History Narrative  . Not on file    Social Determinants of Health   Financial Resource Strain:   . Difficulty of Paying Living Expenses: Not on file  Food Insecurity:   . Worried About Programme researcher, broadcasting/film/videounning Out of Food in the Last Year: Not on file  . Ran Out of Food in the Last Year: Not on file  Transportation Needs:   . Lack of Transportation (Medical): Not on file  . Lack of Transportation (Non-Medical): Not on file  Physical Activity:   . Days of Exercise per Week: Not on file  . Minutes of Exercise per Session: Not on file  Stress:   . Feeling of Stress : Not on file  Social Connections:   . Frequency of Communication with Friends and Family: Not on file  . Frequency of Social Gatherings with Friends and Family: Not on file  . Attends Religious Services: Not on file  . Active Member of Clubs or Organizations: Not on file  . Attends BankerClub or Organization Meetings: Not on file  . Marital Status: Not on file    Allergies:  Allergies  Allergen Reactions  . Penicillins     Unknown-childhood allergy Pt tolerated Rocephin IV on 04/11/2018 (monitored in the ED)    Metabolic Disorder Labs: No results found for: HGBA1C, MPG No results found for: PROLACTIN No results found for: CHOL, TRIG, HDL, CHOLHDL, VLDL, LDLCALC No results found for: TSH  Therapeutic Level Labs: No results found for: LITHIUM No results found for: VALPROATE No components found for:  CBMZ  Current Medications: Current Outpatient Medications  Medication Sig Dispense Refill  . alendronate (FOSAMAX) 70 MG tablet Take 70 mg by mouth once a week. Take with a full glass of water on an empty stomach.    Marland Kitchen. atorvastatin (LIPITOR) 20 MG tablet Take 20 mg by mouth every morning.     . Calcium Carb-Cholecalciferol (CALCIUM 600 + D PO) Take 1 tablet by mouth daily.    Melene Muller. [START ON 08/23/2020] clonazePAM (KLONOPIN) 1 MG tablet Take 1 mg daily and 0.5 mg at night 45 tablet 2  . dexlansoprazole (DEXILANT) 60 MG capsule Take 60 mg by mouth daily.    . fexofenadine  (ALLEGRA) 180 MG tablet Take 180 mg by mouth daily as needed for allergies.     . hydrochlorothiazide (HYDRODIURIL) 25 MG tablet Take 25 mg by mouth daily.    Marland Kitchen. latanoprost (XALATAN) 0.005 % ophthalmic solution Place 1 drop into both eyes at bedtime.    Marland Kitchen. loperamide (IMODIUM) 2 MG capsule Take 2 mg by mouth as needed for diarrhea or loose stools.    . meclizine (ANTIVERT) 25 MG tablet Take 25 mg by mouth 3 (three) times daily as needed for dizziness.     Marland Kitchen. MYRBETRIQ 50 MG TB24 tablet Take 50  mg by mouth daily.    . potassium chloride (K-DUR) 10 MEQ tablet Take 1 tablet (10 mEq total) by mouth 2 (two) times daily. 10 tablet 0  . promethazine (PHENERGAN) 25 MG tablet Take 25 mg by mouth every 6 (six) hours as needed for nausea or vomiting.    . propranolol (INDERAL) 60 MG tablet Take 60 mg by mouth 2 (two) times daily.    . SYMBICORT 160-4.5 MCG/ACT inhaler Inhale 2 puffs into the lungs 2 (two) times daily.    . timolol (BETIMOL) 0.5 % ophthalmic solution Place 1 drop into the right eye daily.    Melene Muller ON 08/22/2020] venlafaxine XR (EFFEXOR-XR) 150 MG 24 hr capsule Take 1 capsule (150 mg total) by mouth daily with breakfast. 90 capsule 0   No current facility-administered medications for this visit.     Musculoskeletal: Strength & Muscle Tone: N/A Gait & Station: N/A Patient leans: N/A  Psychiatric Specialty Exam: Review of Systems  Psychiatric/Behavioral: Negative for agitation, behavioral problems, confusion, decreased concentration, dysphoric mood, hallucinations, self-injury, sleep disturbance and suicidal ideas. The patient is not nervous/anxious and is not hyperactive.   All other systems reviewed and are negative.   There were no vitals taken for this visit.There is no height or weight on file to calculate BMI.  General Appearance: Fairly Groomed  Eye Contact:  Good  Speech:  Clear and Coherent  Volume:  Normal  Mood:  good  Affect:  Appropriate and Congruent  Thought  Process:  Coherent  Orientation:  Full (Time, Place, and Person)  Thought Content: Logical   Suicidal Thoughts:  No  Homicidal Thoughts:  No  Memory:  Immediate;   Good  Judgement:  Good  Insight:  Good  Psychomotor Activity:  Normal  Concentration:  Concentration: Good and Attention Span: Good  Recall:  Good  Fund of Knowledge: Good  Language: Good  Akathisia:  No  Handed:  Right  AIMS (if indicated): not done  Assets:  Communication Skills Desire for Improvement  ADL's:  Intact  Cognition: WNL  Sleep:  Good   Screenings:   Assessment and Plan:  FAELYNN WYNDER is a 85 y.o. year old female with a history of depression, neurocognitive disorder, hypertension,hyperlipidemia, irritable bowel syndrome, left patella fracture s/p ORIF , who presents for follow up appointment for below.   1. MDD (major depressive disorder), recurrent, in partial remission (HCC) 2. Generalized anxiety disorder She denies significant mood symptoms since the last visit.  Psychosocial stressors includes demoralization due to medical condition of IBS and decreased vision.    Will continue current medication regimen.  Will continue venlafaxine to target depression and anxiety.  Will continue clonazepam as needed for anxiety.  Noted that although it is preferable to be off clonazepam in geriatric population, both the patient and her family members has strong preference to stay on the current dose.  Discussed potential risk of drowsiness and fall.   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 No significant change in her cognitive function.She could not tolerate Aricept due to side effect of visual hallucinations.We will continue to monitor.  Plan I have reviewed and updated plans as below 1. Continue venlafaxine 150 mg daily 2. Continue clonazepam 1 mg during the day and 0.5 mg at night  3.Next appointment: 2/22 at 1:20 for 20 mins, video - Her  Daughter's phone number- Bonnie Rush,[phone]. Bonnie Rush- [phone] ([facility]icept discontinued due to [facility])  Past trials  of medication: Venlafaxine, clonazepam, Xanax (AH, VH, paranoia), Trazodone (palpitation)  I have reviewed suicide assessment in detail. No change in the following assessment.   The patient demonstrates the following risk factors for suicide: Chronic risk factorsfor suicide include psychiatric disorder, demographic factors ( >53 yo). Acute risk factorsfor suicide includenone.Protective factorsfor this patient include positive social support,hope for the future. Considering these factors, the overall suicide risk at this point appears to be low.   [name], MD 08/15/2020, 1:47 PM

## 2020-08-15 ENCOUNTER — Encounter: Payer: Self-pay | Admitting: Psychiatry

## 2020-08-15 ENCOUNTER — Telehealth (HOSPITAL_COMMUNITY): Payer: Medicare Other | Admitting: Psychiatry

## 2020-08-15 ENCOUNTER — Telehealth (INDEPENDENT_AMBULATORY_CARE_PROVIDER_SITE_OTHER): Payer: Medicare Other | Admitting: Psychiatry

## 2020-08-15 ENCOUNTER — Other Ambulatory Visit: Payer: Self-pay

## 2020-08-15 DIAGNOSIS — F3341 Major depressive disorder, recurrent, in partial remission: Secondary | ICD-10-CM | POA: Diagnosis not present

## 2020-08-15 DIAGNOSIS — F411 Generalized anxiety disorder: Secondary | ICD-10-CM | POA: Diagnosis not present

## 2020-08-15 MED ORDER — VENLAFAXINE HCL ER 150 MG PO CP24: 150.0000 mg | ORAL_CAPSULE | Freq: Every day | ORAL | 0 refills | Status: AC

## 2020-08-15 MED ORDER — CLONAZEPAM 1 MG PO TABS: ORAL_TABLET | ORAL | 2 refills | Status: AC

## 2020-08-17 ENCOUNTER — Emergency Department (HOSPITAL_COMMUNITY): Payer: Medicare Other

## 2020-08-17 ENCOUNTER — Emergency Department (HOSPITAL_COMMUNITY)
Admission: EM | Admit: 2020-08-17 | Discharge: 2020-08-17 | Disposition: A | Discharge status: Home / Self Care | Payer: Medicare Other | Attending: Emergency Medicine | Admitting: Emergency Medicine

## 2020-08-17 ENCOUNTER — Other Ambulatory Visit: Payer: Self-pay

## 2020-08-17 ENCOUNTER — Encounter (HOSPITAL_COMMUNITY): Payer: Self-pay | Admitting: Emergency Medicine

## 2020-08-17 DIAGNOSIS — Z79899 Other long term (current) drug therapy: Secondary | ICD-10-CM | POA: Diagnosis not present

## 2020-08-17 DIAGNOSIS — E041 Nontoxic single thyroid nodule: Secondary | ICD-10-CM | POA: Diagnosis not present

## 2020-08-17 DIAGNOSIS — Z23 Encounter for immunization: Secondary | ICD-10-CM | POA: Insufficient documentation

## 2020-08-17 DIAGNOSIS — F0150 Vascular dementia without behavioral disturbance: Secondary | ICD-10-CM | POA: Insufficient documentation

## 2020-08-17 DIAGNOSIS — J324 Chronic pansinusitis: Secondary | ICD-10-CM | POA: Diagnosis not present

## 2020-08-17 DIAGNOSIS — H748X3 Other specified disorders of middle ear and mastoid, bilateral: Secondary | ICD-10-CM | POA: Diagnosis not present

## 2020-08-17 DIAGNOSIS — Z20822 Contact with and (suspected) exposure to covid-19: Secondary | ICD-10-CM | POA: Diagnosis not present

## 2020-08-17 DIAGNOSIS — S0083XA Contusion of other part of head, initial encounter: Secondary | ICD-10-CM | POA: Insufficient documentation

## 2020-08-17 DIAGNOSIS — S0990XA Unspecified injury of head, initial encounter: Secondary | ICD-10-CM

## 2020-08-17 DIAGNOSIS — I10 Essential (primary) hypertension: Secondary | ICD-10-CM | POA: Diagnosis not present

## 2020-08-17 DIAGNOSIS — S52025A Nondisplaced fracture of olecranon process without intraarticular extension of left ulna, initial encounter for closed fracture: Secondary | ICD-10-CM | POA: Diagnosis not present

## 2020-08-17 DIAGNOSIS — W101XXA Fall (on)(from) sidewalk curb, initial encounter: Secondary | ICD-10-CM | POA: Diagnosis not present

## 2020-08-17 DIAGNOSIS — S0003XA Contusion of scalp, initial encounter: Secondary | ICD-10-CM | POA: Diagnosis not present

## 2020-08-17 DIAGNOSIS — S52022A Displaced fracture of olecranon process without intraarticular extension of left ulna, initial encounter for closed fracture: Secondary | ICD-10-CM | POA: Diagnosis not present

## 2020-08-17 DIAGNOSIS — G319 Degenerative disease of nervous system, unspecified: Secondary | ICD-10-CM | POA: Diagnosis not present

## 2020-08-17 DIAGNOSIS — Z880 Allergy status to penicillin: Secondary | ICD-10-CM | POA: Diagnosis not present

## 2020-08-17 DIAGNOSIS — S199XXA Unspecified injury of neck, initial encounter: Secondary | ICD-10-CM | POA: Diagnosis not present

## 2020-08-17 DIAGNOSIS — M4802 Spinal stenosis, cervical region: Secondary | ICD-10-CM | POA: Diagnosis not present

## 2020-08-17 DIAGNOSIS — S52122A Displaced fracture of head of left radius, initial encounter for closed fracture: Secondary | ICD-10-CM | POA: Diagnosis not present

## 2020-08-17 LAB — RESP PANEL BY RT-PCR (FLU A&B, COVID) ARPGX2
Influenza A by PCR: NEGATIVE
Influenza B by PCR: NEGATIVE
SARS Coronavirus 2 by RT PCR: NEGATIVE

## 2020-08-17 MED ORDER — HYDROCODONE-ACETAMINOPHEN 5-325 MG PO TABS
1.0000 | ORAL_TABLET | Freq: Once | ORAL | Status: AC
  Administered 2020-08-17: 1 via ORAL
  Filled 2020-08-17: qty 1

## 2020-08-17 MED ORDER — TETANUS-DIPHTH-ACELL PERTUSSIS 5-2.5-18.5 LF-MCG/0.5 IM SUSY
0.5000 mL | PREFILLED_SYRINGE | Freq: Once | INTRAMUSCULAR | Status: AC
  Administered 2020-08-17: 0.5 mL via INTRAMUSCULAR
  Filled 2020-08-17: qty 0.5

## 2020-08-17 MED ORDER — ACETAMINOPHEN 325 MG PO TABS
650.0000 mg | ORAL_TABLET | Freq: Once | ORAL | Status: AC
  Administered 2020-08-17: 650 mg via ORAL
  Filled 2020-08-17: qty 2

## 2020-08-17 MED ORDER — IBUPROFEN 400 MG PO TABS: 400.0000 mg | ORAL_TABLET | Freq: Three times a day (TID) | ORAL | 0 refills | Status: AC | PRN

## 2020-08-17 MED ORDER — ACETAMINOPHEN 325 MG PO TABS: 650.0000 mg | ORAL_TABLET | Freq: Four times a day (QID) | ORAL | 0 refills | Status: AC | PRN

## 2020-08-17 NOTE — Discharge Instructions (Addendum)
Call Dr Magdalene Patricia office Monday Morning at 8 am to ask for a SAME DAY appointment.  Please tell them that the ER doctor spoke to Dr Carola Frost on the phone, and Dr. Carola Frost wanted Bonnie Rush seen Monday morning in the office for an elbow fracture.  She may need surgery on this elbow.  Bonnie Rush needs to keep her splint dry at all times at home.  She needs to wear the splint at all times, including in bed.  I gave her a sling for extra comfort to support her arm.  She should not lift anything with her left hand.  She can take tylenol 650 mg every 6 hours for pain, and motrin 400 mg every 6 hours for pain.  I advised that we avoid opioids, because they can make her very drowsy and at risk for falling.  This is especially true if she is already taking klonipin  at home.  *  The swelling on her forehead should get better in 2-3 weeks.  She can put an ice pack or cooling pack on her forehead as needed for 10 minutes at a time while at home, to help with the pain.     Also,  you have a thyroid nodule found on your xrays today.  It is recommended you have an outpatient ultrasound of your thyroid gland.  Call your primary doctor to schedule this for you.

## 2020-08-17 NOTE — ED Triage Notes (Signed)
Pt had a fall to concrete x 30 mins ago, hematoma noted forehead and swelling noted to left elbow

## 2020-08-17 NOTE — ED Provider Notes (Signed)
[facility] EMERGENCY DEPARTMENT Provider Note   CSN: [id number] Arrival date & time: 08/17/20  1730     History Chief Complaint  Patient presents with   Bonnie Rush    Bonnie Rush is a 86 y.o. female with a history of hypertension, osteoporosis IBS and depression presenting for evaluation of fall which occurred just prior to arrival.  She was leaving her family's home after having her Thanksgiving dinner when she tripped and fell forward onto the sidewalk pavement hitting her left elbow and her head sustaining bruising and swelling of both sites.  She had no LOC at the time of the event.  She reports a mild headache, but  denies neck pain, vision changes, has no chest pain, nausea or vomiting, also denies lower extremity pain hip or back symptoms.  She has applied ice to the left elbow prior to arrival.  She has had no other treatments.  She is not current on her tetanus vaccination. The history is provided by the patient and a relative (son at bedside).       Past Medical History:  Diagnosis Date   Glaucoma    High cholesterol    Hypertension    Irritable bowel syndrome    Osteoporosis    Palpitations     Patient Active Problem List   Diagnosis Date Noted   Chip fracture of triquetral bone of left wrist 07/07/18 08/27/2018   Major neurocognitive disorder (HCC) 06/12/2017   Moderate episode of recurrent major depressive disorder (HCC) 06/20/2016   Closed fracture of left patella     Past Surgical History:  Procedure Laterality Date   ABDOMINAL HYSTERECTOMY     BREAST SURGERY     CHOLECYSTECTOMY     EYE SURGERY     ORIF PATELLA Left 01/19/2016   Procedure: OPEN REDUCTION INTERNAL (ORIF) FIXATION LEFT PATELLA  FRACTURE;  Surgeon: Vickki HearingStanley E Harrison, MD;  Location: [facility];  Service: Orthopedics;  Laterality: Left;     OB History   No obstetric history on file.     Family History  Problem Relation Age of Onset   High blood pressure Mother    Stroke  Father     Social History   Tobacco Use   Smoking status: Never Smoker   Smokeless tobacco: Never Used  Substance Use Topics   Alcohol use: No   Drug use: No    Home Medications Prior to Admission medications   Medication Sig Start Date End Date Taking? Authorizing Provider  acetaminophen (TYLENOL) 325 MG tablet Take 2 tablets (650 mg total) by mouth every 6 (six) hours as needed for up to 30 doses for mild pain or moderate pain. 08/17/20   Terald Sleeperrifan, Matthew J, MD  alendronate (FOSAMAX) 70 MG tablet Take 70 mg by mouth once a week. Take with a full glass of water on an empty stomach.    [provider]  atorvastatin (LIPITOR) 20 MG tablet Take 20 mg by mouth every morning.     [provider]  Calcium Carb-Cholecalciferol (CALCIUM 600 + D PO) Take 1 tablet by mouth daily.    [provider]  clonazePAM (KLONOPIN) 1 MG tablet Take 1 mg daily and 0.5 mg at night 08/23/20   Hisada, Barbee Cougheina, MD  dexlansoprazole (DEXILANT) 60 MG capsule Take 60 mg by mouth daily.    [provider]  fexofenadine (ALLEGRA) 180 MG tablet Take 180 mg by mouth daily as needed for allergies.     [provider]  hydrochlorothiazide (HYDRODIURIL) 25 MG tablet Take 25 mg by mouth daily.    [provider]  ibuprofen (ADVIL) 400 MG tablet Take 1 tablet (400 mg total) by mouth every 8 (eight) hours as needed for up to 25 doses for mild pain or moderate pain. 08/17/20   Terald Sleeper, MD  latanoprost (XALATAN) 0.005 % ophthalmic solution Place 1 drop into both eyes at bedtime.    [provider]  loperamide (IMODIUM) 2 MG capsule Take 2 mg by mouth as needed for diarrhea or loose stools.    [provider]  meclizine (ANTIVERT) 25 MG tablet Take 25 mg by mouth 3 (three) times daily as needed for dizziness.     [provider]  MYRBETRIQ 50 MG TB24 tablet Take 50 mg by mouth daily. 02/28/20   [provider]  potassium chloride  (K-DUR) 10 MEQ tablet Take 1 tablet (10 mEq total) by mouth 2 (two) times daily. 04/11/18   Linwood Dibbles, MD  promethazine (PHENERGAN) 25 MG tablet Take 25 mg by mouth every 6 (six) hours as needed for nausea or vomiting.    [provider]  propranolol (INDERAL) 60 MG tablet Take 60 mg by mouth 2 (two) times daily.    [provider]  SYMBICORT 160-4.5 MCG/ACT inhaler Inhale 2 puffs into the lungs 2 (two) times daily. 02/28/20   [provider]  timolol (BETIMOL) 0.5 % ophthalmic solution Place 1 drop into the right eye daily.    [provider]  venlafaxine XR (EFFEXOR-XR) 150 MG 24 hr capsule Take 1 capsule (150 mg total) by mouth daily with breakfast. 08/22/20   [name], MD    Allergies    Penicillins  Review of Systems   Review of Systems  Constitutional: Negative for fever.  HENT: Positive for facial swelling. Negative for nosebleeds.   Respiratory: Negative.   Cardiovascular: Negative.   Gastrointestinal: Negative.  Negative for nausea and vomiting.  Musculoskeletal: Positive for arthralgias and joint swelling. Negative for myalgias.  Skin: Positive for wound.  Neurological: Positive for headaches. Negative for weakness and numbness.    Physical Exam Updated Vital Signs BP (!) 176/75 (BP Location: Right Arm)    Pulse 63    Temp 98.2 F (36.8 C) (Oral)    Resp (!) 22    Ht 5\' 4"  (1.626 m)    SpO2 93%    BMI 23.17 kg/m   Physical Exam Vitals and nursing note reviewed.  Constitutional:      Appearance: She is well-developed.  HENT:     Head: Normocephalic.     Comments: Large hematoma left forehead, mildly abraded, hemostatic.    Right Ear: No hemotympanum.     Left Ear: No hemotympanum.  Eyes:     Conjunctiva/sclera: Conjunctivae normal.     Pupils: Pupils are equal, round, and reactive to light.  Cardiovascular:     Rate and Rhythm: Normal rate and regular rhythm.     Heart sounds: Normal heart sounds.     Comments: Pulses equal  bilaterally Pulmonary:     Effort: Pulmonary effort is normal.     Breath sounds: Normal breath sounds. No wheezing.  Abdominal:     General: Bowel sounds are normal.     Palpations: Abdomen is soft.     Tenderness: There is no abdominal tenderness.  Musculoskeletal:        General: Tenderness present.     Left elbow: Effusion present. Decreased range of motion. Tenderness present  in olecranon process.     Cervical back: Normal range of motion. No rigidity. No pain with movement or spinous process tenderness.     Comments: Mild hemostatic abrasions noted at the left elbow.  Patient has no pain or swelling of all lower joints including hips knees ankles.  Skin:    General: Skin is warm and dry.  Neurological:     Mental Status: She is alert.     Sensory: No sensory deficit.     Deep Tendon Reflexes: Reflexes normal.     ED Results / Procedures / Treatments   Labs (all labs ordered are listed, but only abnormal results are displayed) Labs Reviewed  RESP PANEL BY RT-PCR (FLU A&B, COVID) [id number]    EKG EKG Interpretation  Date/Time:  Thursday August 17 2020 17:59:51 EST Ventricular Rate:  64 PR Interval:  166 QRS Duration: 92 QT Interval:  398 QTC Calculation: 410 R Axis:   55 Text Interpretation: Normal sinus rhythm with sinus arrhythmia Nonspecific T wave abnormality Abnormal ECG No STEMI Confirmed by Alvester Chou [phone]) on 08/17/2020 6:55:50 PM   Radiology DG Elbow Complete Left  Result Date: 08/17/2020 CLINICAL DATA:  Fall.  Elbow pain.  Deformity. EXAM: LEFT ELBOW - COMPLETE 3+ VIEW COMPARISON:  None. FINDINGS: Olecranon fracture is displaced to 2 cm and rotated. Large effusion is present. Minimally displaced radial head fracture suspected. Large effusion is present. Extensive soft tissue swelling is present. Distal humerus is intact. IMPRESSION: 1. Displaced and rotated olecranon fracture. 2. Minimally displaced radial head fracture. 3. Large joint effusion and  soft tissue swelling. Electronically Signed   By: Marin Roberts M.D.   On: 08/17/2020 19:16   CT Head Wo Contrast  Result Date: 08/17/2020 CLINICAL DATA:  86 year old who fell onto concrete approximately 30 minutes prior to emergency department arrival, striking the forehead. LEFT frontal hematoma. No loss of consciousness. Initial encounter. EXAM: CT HEAD WITHOUT CONTRAST CT CERVICAL SPINE WITHOUT CONTRAST TECHNIQUE: Multidetector CT imaging of the head and cervical spine was performed following the standard protocol without intravenous contrast. Multiplanar CT image reconstructions of the cervical spine were also generated. COMPARISON:  None. FINDINGS: CT HEAD FINDINGS Brain: Ventricular system normal in size and appearance for age. Mild to moderate age appropriate cortical and deep atrophy. Severe changes of small vessel disease of the white matter diffusely. No mass lesion. No midline shift. No acute hemorrhage or hematoma. No extra-axial fluid collections. No evidence of acute infarction. Vascular: Moderate to severe BILATERAL carotid siphon atherosclerosis and mild BILATERAL vertebral artery atherosclerosis. No hyperdense vessel. Skull: Hyperostosis frontalis interna. No skull fracture or other focal osseous abnormality involving the skull. Sinuses/Orbits: Mucosal thickening involving the maxillary sinuses. Opacification of scattered LEFT ethmoid air cells. Mild mucosal thickening involving the frontal sinuses. Mucosal thickening involving the LEFT sphenoid sinus. No air-fluid levels. BILATERAL mastoid air cells and middle ear cavities well-aerated. LEFT eye prosthesis.  RIGHT orbit and globe unremarkable. Other: Small LEFT frontal scalp hematoma. CT CERVICAL SPINE FINDINGS Alignment: Anatomic posterior alignment. Exaggeration of the usual cervical kyphosis. Facet joints anatomically aligned throughout with diffuse degenerative changes. Skull base and vertebrae: No fractures identified involving  the cervical spine. Osseous demineralization. Coronal reformatted images demonstrate an intact craniocervical junction, intact dens and intact lateral masses throughout. Soft tissues and spinal canal: No evidence of paraspinous or spinal canal hematoma. No evidence of spinal stenosis. Disc levels: Predominantly facet hypertrophy and associated mild uncinate hypertrophy account for multilevel foraminal stenoses including severe LEFT and moderate  RIGHT C3-4, moderate BILATERAL C4-5, moderate LEFT and mild RIGHT C5-6, and severe BILATERAL C6-7. Upper chest: Visualized lung apices clear. Nodule arising from the LOWER pole of the LEFT lobe of the thyroid gland with a maximum measurement of 1.9 cm, incompletely imaged. Other: Minimal atherosclerosis involving the LEFT cervical carotid artery. IMPRESSION: 1. No acute intracranial abnormality. 2. Mild to moderate age appropriate generalized atrophy and severe chronic microvascular ischemic changes of the white matter. 3. Small LEFT frontal scalp hematoma without evidence of skull fracture. 4. No fractures identified involving the cervical spine. 5. Multilevel foraminal stenoses as detailed above. 6. Chronic pansinusitis. 7. Nodule arising from the LOWER pole of the LEFT lobe of the thyroid gland with a maximum measurement of 1.9 cm, incompletely imaged. Recommend non-emergent thyroid US. (Ref: J Am Coll Radiol. 2015 Feb;12(2): 143-50). Electronically Signed   By: Hulan Saas M.D.   On: 08/17/2020 19:19   CT Cervical Spine Wo Contrast  Result Date: 08/17/2020 CLINICAL DATA:  86 year old who fell onto concrete approximately 30 minutes prior to emergency department arrival, striking the forehead. LEFT frontal hematoma. No loss of consciousness. Initial encounter. EXAM: CT HEAD WITHOUT CONTRAST CT CERVICAL SPINE WITHOUT CONTRAST TECHNIQUE: Multidetector CT imaging of the head and cervical spine was performed following the standard protocol without intravenous  contrast. Multiplanar CT image reconstructions of the cervical spine were also generated. COMPARISON:  None. FINDINGS: CT HEAD FINDINGS Brain: Ventricular system normal in size and appearance for age. Mild to moderate age appropriate cortical and deep atrophy. Severe changes of small vessel disease of the white matter diffusely. No mass lesion. No midline shift. No acute hemorrhage or hematoma. No extra-axial fluid collections. No evidence of acute infarction. Vascular: Moderate to severe BILATERAL carotid siphon atherosclerosis and mild BILATERAL vertebral artery atherosclerosis. No hyperdense vessel. Skull: Hyperostosis frontalis interna. No skull fracture or other focal osseous abnormality involving the skull. Sinuses/Orbits: Mucosal thickening involving the maxillary sinuses. Opacification of scattered LEFT ethmoid air cells. Mild mucosal thickening involving the frontal sinuses. Mucosal thickening involving the LEFT sphenoid sinus. No air-fluid levels. BILATERAL mastoid air cells and middle ear cavities well-aerated. LEFT eye prosthesis.  RIGHT orbit and globe unremarkable. Other: Small LEFT frontal scalp hematoma. CT CERVICAL SPINE FINDINGS Alignment: Anatomic posterior alignment. Exaggeration of the usual cervical kyphosis. Facet joints anatomically aligned throughout with diffuse degenerative changes. Skull base and vertebrae: No fractures identified involving the cervical spine. Osseous demineralization. Coronal reformatted images demonstrate an intact craniocervical junction, intact dens and intact lateral masses throughout. Soft tissues and spinal canal: No evidence of paraspinous or spinal canal hematoma. No evidence of spinal stenosis. Disc levels: Predominantly facet hypertrophy and associated mild uncinate hypertrophy account for multilevel foraminal stenoses including severe LEFT and moderate RIGHT C3-4, moderate BILATERAL C4-5, moderate LEFT and mild RIGHT C5-6, and severe BILATERAL C6-7. Upper  chest: Visualized lung apices clear. Nodule arising from the LOWER pole of the LEFT lobe of the thyroid gland with a maximum measurement of 1.9 cm, incompletely imaged. Other: Minimal atherosclerosis involving the LEFT cervical carotid artery. IMPRESSION: 1. No acute intracranial abnormality. 2. Mild to moderate age appropriate generalized atrophy and severe chronic microvascular ischemic changes of the white matter. 3. Small LEFT frontal scalp hematoma without evidence of skull fracture. 4. No fractures identified involving the cervical spine. 5. Multilevel foraminal stenoses as detailed above. 6. Chronic pansinusitis. 7. Nodule arising from the LOWER pole of the LEFT lobe of the thyroid gland with a maximum measurement of 1.9 cm, incompletely imaged.  Recommend non-emergent thyroid US. (Ref: J Am Coll Radiol. 2015 Feb;12(2): 143-50). Electronically Signed   By: Hulan Saas M.D.   On: 08/17/2020 19:19    Procedures Procedures (including critical care time)  Medications Ordered in ED Medications  acetaminophen (TYLENOL) tablet 650 mg (650 mg Oral Given 08/17/20 1904)  Tdap (BOOSTRIX) injection 0.5 mL (0.5 mLs Intramuscular Given 08/17/20 2001)  HYDROcodone-acetaminophen (NORCO/VICODIN) 5-325 MG per tablet 1 tablet (1 tablet Oral Given 08/17/20 2002)    ED Course  I have reviewed the triage vital signs and the nursing notes.  Pertinent labs & imaging results that were available during my care of the patient were reviewed by me and considered in my medical decision making (see chart for details).  Clinical Course as of Aug 17 2049  Thu Aug 17, 2020  2032 This is an 86 year old lady present emerge department mechanical fall and left elbow pain and a left forehead hematoma.  She says she tripped and fell on the pavement today.  On exam she does have a hematoma over the left forehead.  She has got swelling and ecchymoses over the left elbow, some superficial abrasions but no open fracture.  CT  scan of the head and C-spine shows a hematoma but no acute fracture or intracranial bleed.  X-ray of the left elbow shows an acute displaced fracture of the olecranon, and possibly minimally displaced radial head fracture.  I discussed the case with Dr Carola Frost from orthopedics, who advised we place the patient in a posterior long-arm splint, well-padded at the elbow, and give a sling, and have them call his office Monday MORNING to be seen the same day in the office.  He anticipates this would likely need surgery next week.  I reviewed the discharge instructions the patient and her son, whom she is staying with.  They agree with this plan and prefer to go home.   [MT]  2033 She is ready and Klonopin for anxiety.  I would not add additional narcotics at home for that, as it raises her fall risk.  On my exam she is actually fairly comfortable, she is not moving her left arm.  We will discharge her with Motrin and Tylenol prescriptions.  Advised that she keep her splint dry and wear it at all times.   [MT]    Clinical Course User Index [MT] Trifan, Kermit Balo, MD   MDM Rules/Calculators/A&P                           Final Clinical Impression(s) / ED Diagnoses Final diagnoses:  Closed fracture of olecranon process of left ulna, initial encounter  Thyroid nodule  Injury of head, initial encounter    Rx / DC Orders ED Discharge Orders         Ordered    acetaminophen (TYLENOL) 325 MG tablet  Every 6 hours PRN        08/17/20 2037    ibuprofen (ADVIL) 400 MG tablet  Every 8 hours PRN        08/17/20 2037           Burgess Amor, Cordelia Poche 08/17/20 2051    Terald Sleeper, MD 08/18/20 1013

## 2020-08-21 DIAGNOSIS — S52032A Displaced fracture of olecranon process with intraarticular extension of left ulna, initial encounter for closed fracture: Secondary | ICD-10-CM | POA: Diagnosis not present

## 2020-08-22 DIAGNOSIS — I10 Essential (primary) hypertension: Secondary | ICD-10-CM | POA: Diagnosis not present

## 2020-08-22 DIAGNOSIS — M159 Polyosteoarthritis, unspecified: Secondary | ICD-10-CM | POA: Diagnosis not present

## 2020-08-22 DIAGNOSIS — J449 Chronic obstructive pulmonary disease, unspecified: Secondary | ICD-10-CM | POA: Diagnosis not present

## 2020-08-23 DIAGNOSIS — Z23 Encounter for immunization: Secondary | ICD-10-CM | POA: Diagnosis not present

## 2020-08-29 DIAGNOSIS — I10 Essential (primary) hypertension: Secondary | ICD-10-CM | POA: Diagnosis not present

## 2020-08-29 DIAGNOSIS — S52202A Unspecified fracture of shaft of left ulna, initial encounter for closed fracture: Secondary | ICD-10-CM | POA: Diagnosis not present

## 2020-08-29 DIAGNOSIS — J449 Chronic obstructive pulmonary disease, unspecified: Secondary | ICD-10-CM | POA: Diagnosis not present

## 2020-08-29 DIAGNOSIS — F0280 Dementia in other diseases classified elsewhere without behavioral disturbance: Secondary | ICD-10-CM | POA: Diagnosis not present

## 2020-08-29 DIAGNOSIS — G309 Alzheimer's disease, unspecified: Secondary | ICD-10-CM | POA: Diagnosis not present

## 2020-08-29 DIAGNOSIS — Z299 Encounter for prophylactic measures, unspecified: Secondary | ICD-10-CM | POA: Diagnosis not present

## 2020-08-31 DIAGNOSIS — R350 Frequency of micturition: Secondary | ICD-10-CM | POA: Diagnosis not present

## 2020-09-21 DIAGNOSIS — M159 Polyosteoarthritis, unspecified: Secondary | ICD-10-CM | POA: Diagnosis not present

## 2020-09-21 DIAGNOSIS — I10 Essential (primary) hypertension: Secondary | ICD-10-CM | POA: Diagnosis not present

## 2020-09-21 DIAGNOSIS — J449 Chronic obstructive pulmonary disease, unspecified: Secondary | ICD-10-CM | POA: Diagnosis not present

## 2020-09-26 DIAGNOSIS — S52032D Displaced fracture of olecranon process with intraarticular extension of left ulna, subsequent encounter for closed fracture with routine healing: Secondary | ICD-10-CM | POA: Diagnosis not present

## 2020-10-25 DIAGNOSIS — R350 Frequency of micturition: Secondary | ICD-10-CM | POA: Diagnosis not present

## 2020-10-26 DIAGNOSIS — Z87891 Personal history of nicotine dependence: Secondary | ICD-10-CM | POA: Diagnosis not present

## 2020-10-26 DIAGNOSIS — Z299 Encounter for prophylactic measures, unspecified: Secondary | ICD-10-CM | POA: Diagnosis not present

## 2020-10-26 DIAGNOSIS — Z6827 Body mass index (BMI) 27.0-27.9, adult: Secondary | ICD-10-CM | POA: Diagnosis not present

## 2020-10-26 DIAGNOSIS — G309 Alzheimer's disease, unspecified: Secondary | ICD-10-CM | POA: Diagnosis not present

## 2020-10-26 DIAGNOSIS — R410 Disorientation, unspecified: Secondary | ICD-10-CM | POA: Diagnosis not present

## 2020-10-26 DIAGNOSIS — R69 Illness, unspecified: Secondary | ICD-10-CM | POA: Diagnosis not present

## 2020-10-26 DIAGNOSIS — I10 Essential (primary) hypertension: Secondary | ICD-10-CM | POA: Diagnosis not present

## 2020-11-06 DIAGNOSIS — I10 Essential (primary) hypertension: Secondary | ICD-10-CM | POA: Diagnosis not present

## 2020-11-06 DIAGNOSIS — R69 Illness, unspecified: Secondary | ICD-10-CM | POA: Diagnosis not present

## 2020-11-06 DIAGNOSIS — Z299 Encounter for prophylactic measures, unspecified: Secondary | ICD-10-CM | POA: Diagnosis not present

## 2020-11-06 DIAGNOSIS — Z6827 Body mass index (BMI) 27.0-27.9, adult: Secondary | ICD-10-CM | POA: Diagnosis not present

## 2020-11-06 DIAGNOSIS — N390 Urinary tract infection, site not specified: Secondary | ICD-10-CM | POA: Diagnosis not present

## 2020-11-06 DIAGNOSIS — G309 Alzheimer's disease, unspecified: Secondary | ICD-10-CM | POA: Diagnosis not present

## 2020-11-06 DIAGNOSIS — R32 Unspecified urinary incontinence: Secondary | ICD-10-CM | POA: Diagnosis not present

## 2020-11-08 NOTE — Progress Notes (Deleted)
[facility] MD/PA/NP OP Progress Note  11/08/2020 2:19 PM Bonnie Rush  MRN:  [id number]  Chief Complaint:  HPI:  - She presented to ED after a mechanical fall.    Visit Diagnosis: No diagnosis found.  Past Psychiatric History: Please see initial evaluation for full details. I have reviewed the history. No updates at this time.     Past Medical History:  Past Medical History:  Diagnosis Date  . Glaucoma   . High cholesterol   . Hypertension   . Irritable bowel syndrome   . Osteoporosis   . Palpitations     Past Surgical History:  Procedure Laterality Date  . ABDOMINAL HYSTERECTOMY    . BREAST SURGERY    . CHOLECYSTECTOMY    . EYE SURGERY    . ORIF PATELLA Left 01/19/2016   Procedure: OPEN REDUCTION INTERNAL (ORIF) FIXATION LEFT PATELLA  FRACTURE;  Surgeon: Vickki Hearing, MD;  Location: [facility];  Service: Orthopedics;  Laterality: Left;    Family Psychiatric History: Please see initial evaluation for full details. I have reviewed the history. No updates at this time.     Family History:  Family History  Problem Relation Age of Onset  . High blood pressure Mother   . Stroke Father     Social History:  Social History   Socioeconomic History  . Marital status: Widowed    Spouse name: Not on file  . Number of children: Not on file  . Years of education: Not on file  . Highest education level: Not on file  Occupational History  . Not on file  Tobacco Use  . Smoking status: Never Smoker  . Smokeless tobacco: Never Used  Substance and Sexual Activity  . Alcohol use: No  . Drug use: No  . Sexual activity: Not Currently  Other Topics Concern  . Not on file  Social History Narrative  . Not on file   Social Determinants of Health   Financial Resource Strain: Not on file  Food Insecurity: Not on file  Transportation Needs: Not on file  Physical Activity: Not on file  Stress: Not on file  Social Connections: Not on file    Allergies:  Allergies  Allergen  Reactions  . Penicillins     Unknown-childhood allergy Pt tolerated Rocephin IV on 04/11/2018 (monitored in the ED)    Metabolic Disorder Labs: No results found for: HGBA1C, MPG No results found for: PROLACTIN No results found for: CHOL, TRIG, HDL, CHOLHDL, VLDL, LDLCALC No results found for: TSH  Therapeutic Level Labs: No results found for: LITHIUM No results found for: VALPROATE No components found for:  CBMZ  Current Medications: Current Outpatient Medications  Medication Sig Dispense Refill  . acetaminophen (TYLENOL) 325 MG tablet Take 2 tablets (650 mg total) by mouth every 6 (six) hours as needed for up to 30 doses for mild pain or moderate pain. 30 tablet 0  . alendronate (FOSAMAX) 70 MG tablet Take 70 mg by mouth once a week. Take with a full glass of water on an empty stomach.    Marland Kitchen atorvastatin (LIPITOR) 20 MG tablet Take 20 mg by mouth every morning.     . Calcium Carb-Cholecalciferol (CALCIUM 600 + D PO) Take 1 tablet by mouth daily.    . clonazePAM (KLONOPIN) 1 MG tablet Take 1 mg daily and 0.5 mg at night 45 tablet 2  . dexlansoprazole (DEXILANT) 60 MG capsule Take 60 mg by mouth daily.    . fexofenadine (ALLEGRA) 180  MG tablet Take 180 mg by mouth daily as needed for allergies.     . hydrochlorothiazide (HYDRODIURIL) 25 MG tablet Take 25 mg by mouth daily.    Marland Kitchen ibuprofen (ADVIL) 400 MG tablet Take 1 tablet (400 mg total) by mouth every 8 (eight) hours as needed for up to 25 doses for mild pain or moderate pain. 25 tablet 0  . latanoprost (XALATAN) 0.005 % ophthalmic solution Place 1 drop into both eyes at bedtime.    Marland Kitchen loperamide (IMODIUM) 2 MG capsule Take 2 mg by mouth as needed for diarrhea or loose stools.    . meclizine (ANTIVERT) 25 MG tablet Take 25 mg by mouth 3 (three) times daily as needed for dizziness.     Marland Kitchen MYRBETRIQ 50 MG TB24 tablet Take 50 mg by mouth daily.    . potassium chloride (K-DUR) 10 MEQ tablet Take 1 tablet (10 mEq total) by mouth 2 (two) times  daily. 10 tablet 0  . promethazine (PHENERGAN) 25 MG tablet Take 25 mg by mouth every 6 (six) hours as needed for nausea or vomiting.    . propranolol (INDERAL) 60 MG tablet Take 60 mg by mouth 2 (two) times daily.    . SYMBICORT 160-4.5 MCG/ACT inhaler Inhale 2 puffs into the lungs 2 (two) times daily.    . timolol (BETIMOL) 0.5 % ophthalmic solution Place 1 drop into the right eye daily.    Marland Kitchen venlafaxine XR (EFFEXOR-XR) 150 MG 24 hr capsule Take 1 capsule (150 mg total) by mouth daily with breakfast. 90 capsule 0   No current facility-administered medications for this visit.     Musculoskeletal: Strength & Muscle Tone: N/A Gait & Station: N/A Patient leans: N/A  Psychiatric Specialty Exam: Review of Systems  There were no vitals taken for this visit.There is no height or weight on file to calculate BMI.  General Appearance: {Appearance:22683}  Eye Contact:  {[facility] EYE CONTACT:22684}  Speech:  Clear and Coherent  Volume:  Normal  Mood:  {[facility] MOOD:22306}  Affect:  {Affect (PAA):22687}  Thought Process:  Coherent  Orientation:  Full (Time, Place, and Person)  Thought Content: Logical   Suicidal Thoughts:  {ST/HT (PAA):22692}  Homicidal Thoughts:  {ST/HT (PAA):22692}  Memory:  Immediate;   Good  Judgement:  {Judgement (PAA):22694}  Insight:  {Insight (PAA):22695}  Psychomotor Activity:  Normal  Concentration:  Concentration: Good and Attention Span: Good  Recall:  Good  Fund of Knowledge: Good  Language: Good  Akathisia:  No  Handed:  Right  AIMS (if indicated): not done  Assets:  Communication Skills Desire for Improvement  ADL's:  Intact  Cognition: WNL  Sleep:  {[facility] GOOD/FAIR/POOR:22877}   Screenings:   Assessment and Plan:  Bonnie Rush is a 87 y.o. year old female with a history of depression, neurocognitive disorder, hypertension,hyperlipidemia, irritable bowel syndrome, left patella fracture s/p ORIF, who presents for follow up appointment for below.    1.  MDD (major depressive disorder), recurrent, in partial remission (HCC) 2. Generalized anxiety disorder She denies significant mood symptoms since the last visit. Psychosocial stressors includes demoralization due to medical condition of IBS and decreased vision.  Will continue current medication regimen.  Will continue venlafaxine to target depression and anxiety.  Will continue clonazepam as needed for anxiety.  Noted that although it is preferable to be off clonazepam in geriatric population, both the patient and her family members has strong preference to stay on the current dose.  Discussed potential risk of drowsiness  and fall.   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 No significant change in her cognitive function.She could not tolerate Aricept due to side effect of visual hallucinations.We will continue to monitor.  Plan  1. Continue venlafaxine 150 mg daily 2. Continue clonazepam 1 mg during the day and 0.5 mg at night  3.Next appointment:2/22 at 1:20 for 20 mins, video- Her Daughter's phone number- Melissa,[phone]. Gary/son- [phone] (Aricept discontinued due to [facility])  Past trials of medication: Venlafaxine, clonazepam, Xanax (AH, VH, paranoia), Trazodone (palpitation)   The patient demonstrates the following risk factors for suicide: Chronic risk factorsfor suicide include psychiatric disorder, demographic factors ( >37 yo). Acute risk factorsfor suicide includenone.Protective factorsfor this patient include positive social support,hope for the future. Considering these factors, the overall suicide risk at this point appears to be low.   [name], MD 11/08/2020, 2:19 PM This encounter was created in error - please disregard.

## 2020-11-14 ENCOUNTER — Encounter: Payer: Medicare Other | Admitting: Psychiatry

## 2020-11-14 ENCOUNTER — Other Ambulatory Visit: Payer: Self-pay

## 2020-11-14 ENCOUNTER — Telehealth: Payer: Self-pay | Admitting: Psychiatry

## 2020-11-14 NOTE — Telephone Encounter (Signed)
Sent link for video visit through Epic. Patient did not sign in. Called the patient for appointment scheduled today (both of her daughter and her son's phone). The patient did not answer the phone. Left voice message to contact the office.

## 2020-11-15 NOTE — Progress Notes (Signed)
This encounter was created in error - please disregard.

## 2020-11-24 DIAGNOSIS — R69 Illness, unspecified: Secondary | ICD-10-CM | POA: Diagnosis not present

## 2020-11-29 DIAGNOSIS — Z299 Encounter for prophylactic measures, unspecified: Secondary | ICD-10-CM | POA: Diagnosis not present

## 2020-11-29 DIAGNOSIS — R350 Frequency of micturition: Secondary | ICD-10-CM | POA: Diagnosis not present

## 2020-11-29 DIAGNOSIS — G309 Alzheimer's disease, unspecified: Secondary | ICD-10-CM | POA: Diagnosis not present

## 2020-11-29 DIAGNOSIS — N390 Urinary tract infection, site not specified: Secondary | ICD-10-CM | POA: Diagnosis not present

## 2020-11-29 DIAGNOSIS — R69 Illness, unspecified: Secondary | ICD-10-CM | POA: Diagnosis not present

## 2020-11-29 DIAGNOSIS — I10 Essential (primary) hypertension: Secondary | ICD-10-CM | POA: Diagnosis not present

## 2021-02-27 DIAGNOSIS — Z23 Encounter for immunization: Secondary | ICD-10-CM | POA: Diagnosis not present

## 2021-03-01 DIAGNOSIS — N183 Chronic kidney disease, stage 3 unspecified: Secondary | ICD-10-CM | POA: Diagnosis not present

## 2021-03-01 DIAGNOSIS — K5909 Other constipation: Secondary | ICD-10-CM | POA: Diagnosis not present

## 2021-03-01 DIAGNOSIS — I10 Essential (primary) hypertension: Secondary | ICD-10-CM | POA: Diagnosis not present

## 2021-03-01 DIAGNOSIS — K581 Irritable bowel syndrome with constipation: Secondary | ICD-10-CM | POA: Diagnosis not present

## 2021-03-01 DIAGNOSIS — R69 Illness, unspecified: Secondary | ICD-10-CM | POA: Diagnosis not present

## 2021-03-01 DIAGNOSIS — Z299 Encounter for prophylactic measures, unspecified: Secondary | ICD-10-CM | POA: Diagnosis not present

## 2021-05-24 DIAGNOSIS — Z299 Encounter for prophylactic measures, unspecified: Secondary | ICD-10-CM | POA: Diagnosis not present

## 2021-05-24 DIAGNOSIS — N390 Urinary tract infection, site not specified: Secondary | ICD-10-CM | POA: Diagnosis not present

## 2021-05-24 DIAGNOSIS — K529 Noninfective gastroenteritis and colitis, unspecified: Secondary | ICD-10-CM | POA: Diagnosis not present

## 2021-05-24 DIAGNOSIS — I10 Essential (primary) hypertension: Secondary | ICD-10-CM | POA: Diagnosis not present

## 2021-05-24 DIAGNOSIS — R350 Frequency of micturition: Secondary | ICD-10-CM | POA: Diagnosis not present

## 2021-06-19 DIAGNOSIS — R69 Illness, unspecified: Secondary | ICD-10-CM | POA: Diagnosis not present

## 2021-06-19 DIAGNOSIS — G309 Alzheimer's disease, unspecified: Secondary | ICD-10-CM | POA: Diagnosis not present

## 2021-06-19 DIAGNOSIS — I10 Essential (primary) hypertension: Secondary | ICD-10-CM | POA: Diagnosis not present

## 2021-06-19 DIAGNOSIS — Z299 Encounter for prophylactic measures, unspecified: Secondary | ICD-10-CM | POA: Diagnosis not present

## 2021-07-12 DIAGNOSIS — Z23 Encounter for immunization: Secondary | ICD-10-CM | POA: Diagnosis not present

## 2021-07-24 DIAGNOSIS — Z299 Encounter for prophylactic measures, unspecified: Secondary | ICD-10-CM | POA: Diagnosis not present

## 2021-07-24 DIAGNOSIS — I10 Essential (primary) hypertension: Secondary | ICD-10-CM | POA: Diagnosis not present

## 2021-07-24 DIAGNOSIS — G309 Alzheimer's disease, unspecified: Secondary | ICD-10-CM | POA: Diagnosis not present

## 2021-07-24 DIAGNOSIS — R69 Illness, unspecified: Secondary | ICD-10-CM | POA: Diagnosis not present

## 2021-07-24 DIAGNOSIS — Z2821 Immunization not carried out because of patient refusal: Secondary | ICD-10-CM | POA: Diagnosis not present

## 2021-08-06 DIAGNOSIS — E7800 Pure hypercholesterolemia, unspecified: Secondary | ICD-10-CM | POA: Diagnosis not present

## 2021-08-06 DIAGNOSIS — R5383 Other fatigue: Secondary | ICD-10-CM | POA: Diagnosis not present

## 2021-08-06 DIAGNOSIS — Z299 Encounter for prophylactic measures, unspecified: Secondary | ICD-10-CM | POA: Diagnosis not present

## 2021-08-06 DIAGNOSIS — Z6824 Body mass index (BMI) 24.0-24.9, adult: Secondary | ICD-10-CM | POA: Diagnosis not present

## 2021-08-06 DIAGNOSIS — Z1331 Encounter for screening for depression: Secondary | ICD-10-CM | POA: Diagnosis not present

## 2021-08-06 DIAGNOSIS — Z0000 Encounter for general adult medical examination without abnormal findings: Secondary | ICD-10-CM | POA: Diagnosis not present

## 2021-08-06 DIAGNOSIS — K219 Gastro-esophageal reflux disease without esophagitis: Secondary | ICD-10-CM | POA: Diagnosis not present

## 2021-08-06 DIAGNOSIS — Z7189 Other specified counseling: Secondary | ICD-10-CM | POA: Diagnosis not present

## 2021-08-06 DIAGNOSIS — Z1339 Encounter for screening examination for other mental health and behavioral disorders: Secondary | ICD-10-CM | POA: Diagnosis not present

## 2021-08-06 DIAGNOSIS — Z79899 Other long term (current) drug therapy: Secondary | ICD-10-CM | POA: Diagnosis not present

## 2021-08-06 DIAGNOSIS — I10 Essential (primary) hypertension: Secondary | ICD-10-CM | POA: Diagnosis not present

## 2021-08-06 DIAGNOSIS — E559 Vitamin D deficiency, unspecified: Secondary | ICD-10-CM | POA: Diagnosis not present

## 2021-10-01 DIAGNOSIS — Z6824 Body mass index (BMI) 24.0-24.9, adult: Secondary | ICD-10-CM | POA: Diagnosis not present

## 2021-10-01 DIAGNOSIS — I10 Essential (primary) hypertension: Secondary | ICD-10-CM | POA: Diagnosis not present

## 2021-10-01 DIAGNOSIS — Z299 Encounter for prophylactic measures, unspecified: Secondary | ICD-10-CM | POA: Diagnosis not present

## 2021-10-01 DIAGNOSIS — R350 Frequency of micturition: Secondary | ICD-10-CM | POA: Diagnosis not present

## 2021-10-01 DIAGNOSIS — G309 Alzheimer's disease, unspecified: Secondary | ICD-10-CM | POA: Diagnosis not present

## 2021-10-01 DIAGNOSIS — Z87891 Personal history of nicotine dependence: Secondary | ICD-10-CM | POA: Diagnosis not present

## 2021-10-01 DIAGNOSIS — R69 Illness, unspecified: Secondary | ICD-10-CM | POA: Diagnosis not present

## 2021-10-08 DIAGNOSIS — R69 Illness, unspecified: Secondary | ICD-10-CM | POA: Diagnosis not present

## 2021-10-08 DIAGNOSIS — Z299 Encounter for prophylactic measures, unspecified: Secondary | ICD-10-CM | POA: Diagnosis not present

## 2021-10-08 DIAGNOSIS — U071 COVID-19: Secondary | ICD-10-CM | POA: Diagnosis not present

## 2021-11-06 DIAGNOSIS — F0280 Dementia in other diseases classified elsewhere without behavioral disturbance: Secondary | ICD-10-CM | POA: Diagnosis not present

## 2021-11-06 DIAGNOSIS — R69 Illness, unspecified: Secondary | ICD-10-CM | POA: Diagnosis not present

## 2021-11-06 DIAGNOSIS — I10 Essential (primary) hypertension: Secondary | ICD-10-CM | POA: Diagnosis not present

## 2021-11-06 DIAGNOSIS — Z299 Encounter for prophylactic measures, unspecified: Secondary | ICD-10-CM | POA: Diagnosis not present

## 2021-11-06 DIAGNOSIS — G309 Alzheimer's disease, unspecified: Secondary | ICD-10-CM | POA: Diagnosis not present

## 2021-11-06 DIAGNOSIS — Z6824 Body mass index (BMI) 24.0-24.9, adult: Secondary | ICD-10-CM | POA: Diagnosis not present

## 2021-12-14 DIAGNOSIS — Z6824 Body mass index (BMI) 24.0-24.9, adult: Secondary | ICD-10-CM | POA: Diagnosis not present

## 2021-12-14 DIAGNOSIS — Z87891 Personal history of nicotine dependence: Secondary | ICD-10-CM | POA: Diagnosis not present

## 2021-12-14 DIAGNOSIS — R32 Unspecified urinary incontinence: Secondary | ICD-10-CM | POA: Diagnosis not present

## 2021-12-14 DIAGNOSIS — I10 Essential (primary) hypertension: Secondary | ICD-10-CM | POA: Diagnosis not present

## 2021-12-14 DIAGNOSIS — Z299 Encounter for prophylactic measures, unspecified: Secondary | ICD-10-CM | POA: Diagnosis not present

## 2022-01-02 DIAGNOSIS — H401112 Primary open-angle glaucoma, right eye, moderate stage: Secondary | ICD-10-CM | POA: Diagnosis not present

## 2022-01-02 DIAGNOSIS — Z961 Presence of intraocular lens: Secondary | ICD-10-CM | POA: Diagnosis not present

## 2022-01-02 DIAGNOSIS — H35371 Puckering of macula, right eye: Secondary | ICD-10-CM | POA: Diagnosis not present

## 2022-01-02 DIAGNOSIS — H43811 Vitreous degeneration, right eye: Secondary | ICD-10-CM | POA: Diagnosis not present

## 2022-01-02 DIAGNOSIS — Z970 Presence of artificial eye: Secondary | ICD-10-CM | POA: Diagnosis not present

## 2022-01-16 DIAGNOSIS — H35371 Puckering of macula, right eye: Secondary | ICD-10-CM | POA: Diagnosis not present

## 2022-01-16 DIAGNOSIS — Z970 Presence of artificial eye: Secondary | ICD-10-CM | POA: Diagnosis not present

## 2022-01-16 DIAGNOSIS — Z961 Presence of intraocular lens: Secondary | ICD-10-CM | POA: Diagnosis not present

## 2022-01-16 DIAGNOSIS — H401112 Primary open-angle glaucoma, right eye, moderate stage: Secondary | ICD-10-CM | POA: Diagnosis not present

## 2022-01-16 DIAGNOSIS — H43811 Vitreous degeneration, right eye: Secondary | ICD-10-CM | POA: Diagnosis not present

## 2022-02-06 DIAGNOSIS — Z6823 Body mass index (BMI) 23.0-23.9, adult: Secondary | ICD-10-CM | POA: Diagnosis not present

## 2022-02-06 DIAGNOSIS — N183 Chronic kidney disease, stage 3 unspecified: Secondary | ICD-10-CM | POA: Diagnosis not present

## 2022-02-06 DIAGNOSIS — Z299 Encounter for prophylactic measures, unspecified: Secondary | ICD-10-CM | POA: Diagnosis not present

## 2022-02-06 DIAGNOSIS — I10 Essential (primary) hypertension: Secondary | ICD-10-CM | POA: Diagnosis not present

## 2022-02-06 DIAGNOSIS — Z713 Dietary counseling and surveillance: Secondary | ICD-10-CM | POA: Diagnosis not present

## 2022-02-15 DIAGNOSIS — H6123 Impacted cerumen, bilateral: Secondary | ICD-10-CM | POA: Diagnosis not present

## 2022-03-20 DIAGNOSIS — I10 Essential (primary) hypertension: Secondary | ICD-10-CM | POA: Diagnosis not present

## 2022-03-20 DIAGNOSIS — Z6823 Body mass index (BMI) 23.0-23.9, adult: Secondary | ICD-10-CM | POA: Diagnosis not present

## 2022-03-20 DIAGNOSIS — J449 Chronic obstructive pulmonary disease, unspecified: Secondary | ICD-10-CM | POA: Diagnosis not present

## 2022-03-20 DIAGNOSIS — N183 Chronic kidney disease, stage 3 unspecified: Secondary | ICD-10-CM | POA: Diagnosis not present

## 2022-03-20 DIAGNOSIS — Z299 Encounter for prophylactic measures, unspecified: Secondary | ICD-10-CM | POA: Diagnosis not present

## 2022-03-20 DIAGNOSIS — R350 Frequency of micturition: Secondary | ICD-10-CM | POA: Diagnosis not present

## 2022-03-20 DIAGNOSIS — B353 Tinea pedis: Secondary | ICD-10-CM | POA: Diagnosis not present

## 2022-04-19 DIAGNOSIS — J449 Chronic obstructive pulmonary disease, unspecified: Secondary | ICD-10-CM | POA: Diagnosis not present

## 2022-04-19 DIAGNOSIS — R69 Illness, unspecified: Secondary | ICD-10-CM | POA: Diagnosis not present

## 2022-04-19 DIAGNOSIS — Z0000 Encounter for general adult medical examination without abnormal findings: Secondary | ICD-10-CM | POA: Diagnosis not present

## 2022-04-19 DIAGNOSIS — Z299 Encounter for prophylactic measures, unspecified: Secondary | ICD-10-CM | POA: Diagnosis not present

## 2022-04-19 DIAGNOSIS — I10 Essential (primary) hypertension: Secondary | ICD-10-CM | POA: Diagnosis not present

## 2022-04-19 DIAGNOSIS — Z6823 Body mass index (BMI) 23.0-23.9, adult: Secondary | ICD-10-CM | POA: Diagnosis not present

## 2022-05-15 DIAGNOSIS — Z970 Presence of artificial eye: Secondary | ICD-10-CM | POA: Diagnosis not present

## 2022-05-15 DIAGNOSIS — H43811 Vitreous degeneration, right eye: Secondary | ICD-10-CM | POA: Diagnosis not present

## 2022-05-15 DIAGNOSIS — Z961 Presence of intraocular lens: Secondary | ICD-10-CM | POA: Diagnosis not present

## 2022-05-15 DIAGNOSIS — H35371 Puckering of macula, right eye: Secondary | ICD-10-CM | POA: Diagnosis not present

## 2022-05-15 DIAGNOSIS — H401112 Primary open-angle glaucoma, right eye, moderate stage: Secondary | ICD-10-CM | POA: Diagnosis not present

## 2022-05-24 DIAGNOSIS — J1189 Influenza due to unidentified influenza virus with other manifestations: Secondary | ICD-10-CM | POA: Diagnosis not present

## 2022-08-01 DIAGNOSIS — Z79899 Other long term (current) drug therapy: Secondary | ICD-10-CM | POA: Diagnosis not present

## 2022-08-01 DIAGNOSIS — Z2821 Immunization not carried out because of patient refusal: Secondary | ICD-10-CM | POA: Diagnosis not present

## 2022-08-01 DIAGNOSIS — H9201 Otalgia, right ear: Secondary | ICD-10-CM | POA: Diagnosis not present

## 2022-08-01 DIAGNOSIS — Z6822 Body mass index (BMI) 22.0-22.9, adult: Secondary | ICD-10-CM | POA: Diagnosis not present

## 2022-08-01 DIAGNOSIS — Z1331 Encounter for screening for depression: Secondary | ICD-10-CM | POA: Diagnosis not present

## 2022-08-01 DIAGNOSIS — Z66 Do not resuscitate: Secondary | ICD-10-CM | POA: Diagnosis not present

## 2022-08-01 DIAGNOSIS — E7800 Pure hypercholesterolemia, unspecified: Secondary | ICD-10-CM | POA: Diagnosis not present

## 2022-08-01 DIAGNOSIS — E559 Vitamin D deficiency, unspecified: Secondary | ICD-10-CM | POA: Diagnosis not present

## 2022-08-01 DIAGNOSIS — R5383 Other fatigue: Secondary | ICD-10-CM | POA: Diagnosis not present

## 2022-08-01 DIAGNOSIS — Z299 Encounter for prophylactic measures, unspecified: Secondary | ICD-10-CM | POA: Diagnosis not present

## 2022-08-01 DIAGNOSIS — I10 Essential (primary) hypertension: Secondary | ICD-10-CM | POA: Diagnosis not present

## 2022-08-01 DIAGNOSIS — R350 Frequency of micturition: Secondary | ICD-10-CM | POA: Diagnosis not present

## 2022-08-01 DIAGNOSIS — Z0000 Encounter for general adult medical examination without abnormal findings: Secondary | ICD-10-CM | POA: Diagnosis not present

## 2022-08-01 DIAGNOSIS — Z7189 Other specified counseling: Secondary | ICD-10-CM | POA: Diagnosis not present

## 2022-08-01 DIAGNOSIS — Z1339 Encounter for screening examination for other mental health and behavioral disorders: Secondary | ICD-10-CM | POA: Diagnosis not present

## 2022-10-02 DIAGNOSIS — G309 Alzheimer's disease, unspecified: Secondary | ICD-10-CM | POA: Diagnosis not present

## 2022-10-02 DIAGNOSIS — R69 Illness, unspecified: Secondary | ICD-10-CM | POA: Diagnosis not present

## 2022-10-02 DIAGNOSIS — Z6823 Body mass index (BMI) 23.0-23.9, adult: Secondary | ICD-10-CM | POA: Diagnosis not present

## 2022-10-02 DIAGNOSIS — Z87891 Personal history of nicotine dependence: Secondary | ICD-10-CM | POA: Diagnosis not present

## 2022-10-02 DIAGNOSIS — I10 Essential (primary) hypertension: Secondary | ICD-10-CM | POA: Diagnosis not present

## 2022-10-02 DIAGNOSIS — Z299 Encounter for prophylactic measures, unspecified: Secondary | ICD-10-CM | POA: Diagnosis not present

## 2022-10-02 DIAGNOSIS — R32 Unspecified urinary incontinence: Secondary | ICD-10-CM | POA: Diagnosis not present

## 2022-12-13 DIAGNOSIS — F339 Major depressive disorder, recurrent, unspecified: Secondary | ICD-10-CM | POA: Diagnosis not present

## 2022-12-13 DIAGNOSIS — Z0000 Encounter for general adult medical examination without abnormal findings: Secondary | ICD-10-CM | POA: Diagnosis not present

## 2022-12-13 DIAGNOSIS — Z6823 Body mass index (BMI) 23.0-23.9, adult: Secondary | ICD-10-CM | POA: Diagnosis not present

## 2022-12-13 DIAGNOSIS — I10 Essential (primary) hypertension: Secondary | ICD-10-CM | POA: Diagnosis not present

## 2022-12-13 DIAGNOSIS — Z87891 Personal history of nicotine dependence: Secondary | ICD-10-CM | POA: Diagnosis not present

## 2022-12-13 DIAGNOSIS — J449 Chronic obstructive pulmonary disease, unspecified: Secondary | ICD-10-CM | POA: Diagnosis not present

## 2022-12-13 DIAGNOSIS — Z299 Encounter for prophylactic measures, unspecified: Secondary | ICD-10-CM | POA: Diagnosis not present

## 2022-12-16 DIAGNOSIS — Z970 Presence of artificial eye: Secondary | ICD-10-CM | POA: Diagnosis not present

## 2022-12-16 DIAGNOSIS — H401112 Primary open-angle glaucoma, right eye, moderate stage: Secondary | ICD-10-CM | POA: Diagnosis not present

## 2022-12-16 DIAGNOSIS — H43811 Vitreous degeneration, right eye: Secondary | ICD-10-CM | POA: Diagnosis not present

## 2022-12-16 DIAGNOSIS — H35371 Puckering of macula, right eye: Secondary | ICD-10-CM | POA: Diagnosis not present

## 2022-12-16 DIAGNOSIS — Z961 Presence of intraocular lens: Secondary | ICD-10-CM | POA: Diagnosis not present

## 2023-01-10 DIAGNOSIS — I10 Essential (primary) hypertension: Secondary | ICD-10-CM | POA: Diagnosis not present

## 2023-01-10 DIAGNOSIS — Z299 Encounter for prophylactic measures, unspecified: Secondary | ICD-10-CM | POA: Diagnosis not present

## 2023-01-10 DIAGNOSIS — R69 Illness, unspecified: Secondary | ICD-10-CM | POA: Diagnosis not present

## 2023-01-10 DIAGNOSIS — L720 Epidermal cyst: Secondary | ICD-10-CM | POA: Diagnosis not present

## 2023-01-28 DIAGNOSIS — H401112 Primary open-angle glaucoma, right eye, moderate stage: Secondary | ICD-10-CM | POA: Diagnosis not present

## 2023-01-28 DIAGNOSIS — H35371 Puckering of macula, right eye: Secondary | ICD-10-CM | POA: Diagnosis not present

## 2023-01-28 DIAGNOSIS — H43811 Vitreous degeneration, right eye: Secondary | ICD-10-CM | POA: Diagnosis not present

## 2023-01-28 DIAGNOSIS — Z970 Presence of artificial eye: Secondary | ICD-10-CM | POA: Diagnosis not present

## 2023-01-28 DIAGNOSIS — Z961 Presence of intraocular lens: Secondary | ICD-10-CM | POA: Diagnosis not present

## 2023-04-11 DIAGNOSIS — J449 Chronic obstructive pulmonary disease, unspecified: Secondary | ICD-10-CM | POA: Diagnosis not present

## 2023-04-11 DIAGNOSIS — N183 Chronic kidney disease, stage 3 unspecified: Secondary | ICD-10-CM | POA: Diagnosis not present

## 2023-04-11 DIAGNOSIS — F419 Anxiety disorder, unspecified: Secondary | ICD-10-CM | POA: Diagnosis not present

## 2023-04-11 DIAGNOSIS — I10 Essential (primary) hypertension: Secondary | ICD-10-CM | POA: Diagnosis not present

## 2023-04-11 DIAGNOSIS — Z299 Encounter for prophylactic measures, unspecified: Secondary | ICD-10-CM | POA: Diagnosis not present

## 2023-07-09 DIAGNOSIS — Z970 Presence of artificial eye: Secondary | ICD-10-CM | POA: Diagnosis not present

## 2023-07-09 DIAGNOSIS — Z961 Presence of intraocular lens: Secondary | ICD-10-CM | POA: Diagnosis not present

## 2023-07-09 DIAGNOSIS — H401112 Primary open-angle glaucoma, right eye, moderate stage: Secondary | ICD-10-CM | POA: Diagnosis not present

## 2023-07-09 DIAGNOSIS — H43811 Vitreous degeneration, right eye: Secondary | ICD-10-CM | POA: Diagnosis not present

## 2023-07-09 DIAGNOSIS — H35371 Puckering of macula, right eye: Secondary | ICD-10-CM | POA: Diagnosis not present

## 2023-08-07 DIAGNOSIS — Z79899 Other long term (current) drug therapy: Secondary | ICD-10-CM | POA: Diagnosis not present

## 2023-08-07 DIAGNOSIS — Z0000 Encounter for general adult medical examination without abnormal findings: Secondary | ICD-10-CM | POA: Diagnosis not present

## 2023-08-07 DIAGNOSIS — R5383 Other fatigue: Secondary | ICD-10-CM | POA: Diagnosis not present

## 2023-08-07 DIAGNOSIS — E7800 Pure hypercholesterolemia, unspecified: Secondary | ICD-10-CM | POA: Diagnosis not present

## 2023-08-07 DIAGNOSIS — E559 Vitamin D deficiency, unspecified: Secondary | ICD-10-CM | POA: Diagnosis not present

## 2023-12-01 DIAGNOSIS — H43811 Vitreous degeneration, right eye: Secondary | ICD-10-CM | POA: Diagnosis not present

## 2023-12-01 DIAGNOSIS — H35371 Puckering of macula, right eye: Secondary | ICD-10-CM | POA: Diagnosis not present

## 2023-12-01 DIAGNOSIS — Z961 Presence of intraocular lens: Secondary | ICD-10-CM | POA: Diagnosis not present

## 2023-12-01 DIAGNOSIS — H401112 Primary open-angle glaucoma, right eye, moderate stage: Secondary | ICD-10-CM | POA: Diagnosis not present

## 2024-01-20 DIAGNOSIS — Z113 Encounter for screening for infections with a predominantly sexual mode of transmission: Secondary | ICD-10-CM | POA: Diagnosis not present

## 2024-02-20 DIAGNOSIS — R700 Elevated erythrocyte sedimentation rate: Secondary | ICD-10-CM | POA: Diagnosis not present

## 2024-04-07 DIAGNOSIS — H35371 Puckering of macula, right eye: Secondary | ICD-10-CM | POA: Diagnosis not present

## 2024-04-07 DIAGNOSIS — H43811 Vitreous degeneration, right eye: Secondary | ICD-10-CM | POA: Diagnosis not present

## 2024-04-07 DIAGNOSIS — H401112 Primary open-angle glaucoma, right eye, moderate stage: Secondary | ICD-10-CM | POA: Diagnosis not present

## 2024-05-14 DIAGNOSIS — F0283 Dementia in other diseases classified elsewhere, unspecified severity, with mood disturbance: Secondary | ICD-10-CM | POA: Diagnosis not present

## 2024-05-14 DIAGNOSIS — G309 Alzheimer's disease, unspecified: Secondary | ICD-10-CM | POA: Diagnosis not present

## 2024-05-14 DIAGNOSIS — F0284 Dementia in other diseases classified elsewhere, unspecified severity, with anxiety: Secondary | ICD-10-CM | POA: Diagnosis not present

## 2024-05-14 DIAGNOSIS — K219 Gastro-esophageal reflux disease without esophagitis: Secondary | ICD-10-CM | POA: Diagnosis not present

## 2024-07-27 DIAGNOSIS — H35371 Puckering of macula, right eye: Secondary | ICD-10-CM | POA: Diagnosis not present

## 2024-07-27 DIAGNOSIS — H43811 Vitreous degeneration, right eye: Secondary | ICD-10-CM | POA: Diagnosis not present

## 2024-07-27 DIAGNOSIS — Z970 Presence of artificial eye: Secondary | ICD-10-CM | POA: Diagnosis not present

## 2024-07-27 DIAGNOSIS — H401112 Primary open-angle glaucoma, right eye, moderate stage: Secondary | ICD-10-CM | POA: Diagnosis not present

## 2024-07-27 DIAGNOSIS — Z961 Presence of intraocular lens: Secondary | ICD-10-CM | POA: Diagnosis not present
